# Patient Record
Sex: Male | Born: 1955
Health system: Southern US, Community
[De-identification: ages and names within clinical notes are randomized; demographics above are authoritative.]

## PROBLEM LIST (undated history)

## (undated) DIAGNOSIS — G4730 Sleep apnea, unspecified: Secondary | ICD-10-CM

## (undated) DIAGNOSIS — K219 Gastro-esophageal reflux disease without esophagitis: Secondary | ICD-10-CM

## (undated) DIAGNOSIS — H18609 Keratoconus, unspecified, unspecified eye: Secondary | ICD-10-CM

## (undated) DIAGNOSIS — J329 Chronic sinusitis, unspecified: Secondary | ICD-10-CM

## (undated) DIAGNOSIS — S060X9A Concussion with loss of consciousness of unspecified duration, initial encounter: Secondary | ICD-10-CM

## (undated) DIAGNOSIS — Z87442 Personal history of urinary calculi: Secondary | ICD-10-CM

## (undated) DIAGNOSIS — J189 Pneumonia, unspecified organism: Secondary | ICD-10-CM

## (undated) DIAGNOSIS — K5900 Constipation, unspecified: Secondary | ICD-10-CM

## (undated) DIAGNOSIS — M549 Dorsalgia, unspecified: Secondary | ICD-10-CM

## (undated) DIAGNOSIS — C8590 Non-Hodgkin lymphoma, unspecified, unspecified site: Secondary | ICD-10-CM

## (undated) DIAGNOSIS — F329 Major depressive disorder, single episode, unspecified: Secondary | ICD-10-CM

## (undated) DIAGNOSIS — C801 Malignant (primary) neoplasm, unspecified: Secondary | ICD-10-CM

## (undated) DIAGNOSIS — F32A Depression, unspecified: Secondary | ICD-10-CM

## (undated) DIAGNOSIS — F419 Anxiety disorder, unspecified: Secondary | ICD-10-CM

## (undated) DIAGNOSIS — D649 Anemia, unspecified: Secondary | ICD-10-CM

## (undated) DIAGNOSIS — Z9221 Personal history of antineoplastic chemotherapy: Secondary | ICD-10-CM

## (undated) DIAGNOSIS — S060XAA Concussion with loss of consciousness status unknown, initial encounter: Secondary | ICD-10-CM

## (undated) DIAGNOSIS — G8929 Other chronic pain: Secondary | ICD-10-CM

## (undated) HISTORY — PX: CATARACT EXTRACTION, BILATERAL: SHX1313

## (undated) HISTORY — PX: BACK SURGERY: SHX140

## (undated) HISTORY — PX: ANTERIOR FUSION CERVICAL SPINE: SUR626

## (undated) HISTORY — PX: EYE SURGERY: SHX253

---

## 2014-08-21 ENCOUNTER — Encounter (HOSPITAL_BASED_OUTPATIENT_CLINIC_OR_DEPARTMENT_OTHER): Payer: BLUE CROSS/BLUE SHIELD

## 2015-03-12 DIAGNOSIS — Z9221 Personal history of antineoplastic chemotherapy: Secondary | ICD-10-CM

## 2015-03-12 HISTORY — DX: Personal history of antineoplastic chemotherapy: Z92.21

## 2015-06-20 DIAGNOSIS — M5416 Radiculopathy, lumbar region: Secondary | ICD-10-CM | POA: Diagnosis not present

## 2015-06-20 DIAGNOSIS — M545 Low back pain: Secondary | ICD-10-CM | POA: Diagnosis not present

## 2015-06-24 DIAGNOSIS — M545 Low back pain: Secondary | ICD-10-CM | POA: Diagnosis not present

## 2015-06-28 DIAGNOSIS — K219 Gastro-esophageal reflux disease without esophagitis: Secondary | ICD-10-CM | POA: Diagnosis not present

## 2015-06-29 DIAGNOSIS — M4316 Spondylolisthesis, lumbar region: Secondary | ICD-10-CM | POA: Diagnosis not present

## 2015-06-29 DIAGNOSIS — M4806 Spinal stenosis, lumbar region: Secondary | ICD-10-CM | POA: Diagnosis not present

## 2015-06-29 DIAGNOSIS — M545 Low back pain: Secondary | ICD-10-CM | POA: Diagnosis not present

## 2015-07-17 DIAGNOSIS — K219 Gastro-esophageal reflux disease without esophagitis: Secondary | ICD-10-CM | POA: Diagnosis not present

## 2015-07-17 DIAGNOSIS — R12 Heartburn: Secondary | ICD-10-CM | POA: Diagnosis not present

## 2015-07-21 DIAGNOSIS — E785 Hyperlipidemia, unspecified: Secondary | ICD-10-CM | POA: Diagnosis not present

## 2015-07-21 DIAGNOSIS — R002 Palpitations: Secondary | ICD-10-CM | POA: Diagnosis not present

## 2015-07-21 DIAGNOSIS — K219 Gastro-esophageal reflux disease without esophagitis: Secondary | ICD-10-CM | POA: Diagnosis not present

## 2015-07-21 DIAGNOSIS — Z0000 Encounter for general adult medical examination without abnormal findings: Secondary | ICD-10-CM | POA: Diagnosis not present

## 2015-07-21 DIAGNOSIS — Z125 Encounter for screening for malignant neoplasm of prostate: Secondary | ICD-10-CM | POA: Diagnosis not present

## 2015-07-28 DIAGNOSIS — D509 Iron deficiency anemia, unspecified: Secondary | ICD-10-CM | POA: Diagnosis not present

## 2015-07-28 DIAGNOSIS — K5900 Constipation, unspecified: Secondary | ICD-10-CM | POA: Diagnosis not present

## 2015-08-08 DIAGNOSIS — M545 Low back pain: Secondary | ICD-10-CM | POA: Diagnosis not present

## 2015-08-08 DIAGNOSIS — M4806 Spinal stenosis, lumbar region: Secondary | ICD-10-CM | POA: Diagnosis not present

## 2015-08-08 DIAGNOSIS — M4316 Spondylolisthesis, lumbar region: Secondary | ICD-10-CM | POA: Diagnosis not present

## 2015-08-10 DIAGNOSIS — K635 Polyp of colon: Secondary | ICD-10-CM | POA: Diagnosis not present

## 2015-08-10 DIAGNOSIS — D127 Benign neoplasm of rectosigmoid junction: Secondary | ICD-10-CM | POA: Diagnosis not present

## 2015-08-10 DIAGNOSIS — D509 Iron deficiency anemia, unspecified: Secondary | ICD-10-CM | POA: Diagnosis not present

## 2015-08-29 DIAGNOSIS — D509 Iron deficiency anemia, unspecified: Secondary | ICD-10-CM | POA: Diagnosis not present

## 2015-09-18 DIAGNOSIS — D509 Iron deficiency anemia, unspecified: Secondary | ICD-10-CM | POA: Diagnosis not present

## 2015-09-19 DIAGNOSIS — D509 Iron deficiency anemia, unspecified: Secondary | ICD-10-CM | POA: Diagnosis not present

## 2015-09-25 DIAGNOSIS — D509 Iron deficiency anemia, unspecified: Secondary | ICD-10-CM | POA: Diagnosis not present

## 2015-09-28 ENCOUNTER — Other Ambulatory Visit: Payer: Self-pay | Admitting: Gastroenterology

## 2015-09-28 DIAGNOSIS — R198 Other specified symptoms and signs involving the digestive system and abdomen: Secondary | ICD-10-CM

## 2015-09-28 DIAGNOSIS — D509 Iron deficiency anemia, unspecified: Secondary | ICD-10-CM

## 2015-10-03 DIAGNOSIS — H52223 Regular astigmatism, bilateral: Secondary | ICD-10-CM | POA: Diagnosis not present

## 2015-10-04 ENCOUNTER — Ambulatory Visit
Admission: RE | Admit: 2015-10-04 | Discharge: 2015-10-04 | Disposition: A | Discharge status: Home / Self Care | Payer: BLUE CROSS/BLUE SHIELD | Source: Ambulatory Visit | Attending: Gastroenterology | Admitting: Gastroenterology

## 2015-10-04 DIAGNOSIS — K5900 Constipation, unspecified: Secondary | ICD-10-CM | POA: Diagnosis not present

## 2015-10-04 DIAGNOSIS — R198 Other specified symptoms and signs involving the digestive system and abdomen: Secondary | ICD-10-CM

## 2015-10-04 DIAGNOSIS — D509 Iron deficiency anemia, unspecified: Secondary | ICD-10-CM

## 2015-10-04 MED ORDER — IOPAMIDOL (ISOVUE-300) INJECTION 61%
100.0000 mL | Freq: Once | INTRAVENOUS | Status: AC | PRN
  Administered 2015-10-04: 125 mL via INTRAVENOUS

## 2015-10-05 ENCOUNTER — Other Ambulatory Visit: Payer: Self-pay | Admitting: Gastroenterology

## 2015-10-05 DIAGNOSIS — R935 Abnormal findings on diagnostic imaging of other abdominal regions, including retroperitoneum: Secondary | ICD-10-CM | POA: Diagnosis not present

## 2015-10-05 DIAGNOSIS — K6389 Other specified diseases of intestine: Secondary | ICD-10-CM | POA: Diagnosis not present

## 2015-10-05 DIAGNOSIS — D509 Iron deficiency anemia, unspecified: Secondary | ICD-10-CM | POA: Diagnosis not present

## 2015-10-06 ENCOUNTER — Ambulatory Visit
Admission: RE | Admit: 2015-10-06 | Discharge: 2015-10-06 | Disposition: A | Discharge status: Home / Self Care | Payer: BLUE CROSS/BLUE SHIELD | Source: Ambulatory Visit | Attending: Gastroenterology | Admitting: Gastroenterology

## 2015-10-06 DIAGNOSIS — K6389 Other specified diseases of intestine: Secondary | ICD-10-CM

## 2015-10-06 DIAGNOSIS — K7689 Other specified diseases of liver: Secondary | ICD-10-CM | POA: Diagnosis not present

## 2015-10-06 DIAGNOSIS — R935 Abnormal findings on diagnostic imaging of other abdominal regions, including retroperitoneum: Secondary | ICD-10-CM

## 2015-10-06 MED ORDER — GADOBENATE DIMEGLUMINE 529 MG/ML IV SOLN
16.0000 mL | Freq: Once | INTRAVENOUS | Status: AC | PRN
  Administered 2015-10-06: 16 mL via INTRAVENOUS

## 2015-10-09 ENCOUNTER — Ambulatory Visit: Payer: Self-pay | Admitting: General Surgery

## 2015-10-09 DIAGNOSIS — K6389 Other specified diseases of intestine: Secondary | ICD-10-CM | POA: Diagnosis not present

## 2015-10-09 NOTE — H&P (Signed)
History of Present Illness Ralene Ok MD; 10/09/2015 4:03 PM) Patient words: sm bowel mass.  The patient is a 61 year old male who presents with an abdominal mass. Patient is a 60 year old male who is referred by Dr. Anson Fret for evaluation of a small bowel mass. Patient has had some iron deficiency anemia. Patient underwent upper and lower endoscopy. This was negative aside from a colonic polyp. This was hyperplastic. Patient therefore underwent capsule endoscopy. This revealed a small bowel mass near the distal portion of the small bowel. The patient underwent CT enterography which revealed a thickening approximate 5.5 cm in the distal bowel. This also showed a liver mass. This was followed up with an MRI. This was revealed to be focal nodular hyperplasia, 3.9 cm, segment 5.   Patient states he's lost approximately 10 pounds the last 2 weeks.    Other Problems (Ammie Eversole, LPN; X33443 D34-534 PM) Back Pain Gastroesophageal Reflux Disease Kidney Stone Melanoma  Past Surgical History (Ammie Eversole, LPN; X33443 D34-534 PM) Cataract Surgery Bilateral. Colon Polyp Removal - Colonoscopy Spinal Surgery - Lower Back Spinal Surgery - Neck  Diagnostic Studies History (Ammie Eversole, LPN; X33443 D34-534 PM) Colonoscopy within last year  Allergies (Ammie Eversole, LPN; X33443 [id number] PM) Penicillins  Medication History (Ammie Eversole, LPN; X33443 [id number] PM) ClonazePAM (1MG  Tablet, Oral) Active. Dexilant (60MG  Capsule DR, Oral) Active. FLUoxetine HCl (20MG  Tablet, Oral) Active. Gabapentin (300MG  Capsule, Oral) Active. Rosuvastatin Calcium (5MG  Tablet, Oral) Active. Qvar (40MCG/ACT Aerosol Soln, Inhalation) Active. ProAir HFA (108 (90 Base)MCG/ACT Aerosol Soln, Inhalation) Active. Medications Reconciled  Social History (Ammie Eversole, LPN; X33443 D34-534 PM) Caffeine use Carbonated beverages. No alcohol use No drug use Tobacco use Never  smoker.  Family History (Aleatha Borer, LPN; X33443 D34-534 PM) Alcohol Abuse Brother, Father. Arthritis Mother. Colon Polyps Brother. Heart Disease Father, Mother. Prostate Cancer Father.    Review of Systems (Ammie Eversole LPN; X33443 D34-534 PM) General Present- Fatigue and Weight Loss. Not Present- Appetite Loss, Chills, Fever, Night Sweats and Weight Gain. Skin Not Present- Change in Wart/Mole, Dryness, Hives, Jaundice, New Lesions, Non-Healing Wounds, Rash and Ulcer. HEENT Present- Wears glasses/contact lenses. Not Present- Earache, Hearing Loss, Hoarseness, Nose Bleed, Oral Ulcers, Ringing in the Ears, Seasonal Allergies, Sinus Pain, Sore Throat, Visual Disturbances and Yellow Eyes. Respiratory Present- Snoring. Not Present- Bloody sputum, Chronic Cough, Difficulty Breathing and Wheezing. Breast Not Present- Breast Mass, Breast Pain, Nipple Discharge and Skin Changes. Cardiovascular Present- Palpitations and Shortness of Breath. Not Present- Chest Pain, Difficulty Breathing Lying Down, Leg Cramps, Rapid Heart Rate and Swelling of Extremities. Gastrointestinal Present- Bloating and Constipation. Not Present- Abdominal Pain, Bloody Stool, Change in Bowel Habits, Chronic diarrhea, Difficulty Swallowing, Excessive gas, Gets full quickly at meals, Hemorrhoids, Indigestion, Nausea, Rectal Pain and Vomiting. Male Genitourinary Not Present- Blood in Urine, Change in Urinary Stream, Frequency, Impotence, Nocturia, Painful Urination, Urgency and Urine Leakage. Musculoskeletal Present- Back Pain. Not Present- Joint Pain, Joint Stiffness, Muscle Pain, Muscle Weakness and Swelling of Extremities. Neurological Present- Headaches. Not Present- Decreased Memory, Fainting, Numbness, Seizures, Tingling, Tremor, Trouble walking and Weakness. Psychiatric Not Present- Anxiety, Bipolar, Change in Sleep Pattern, Depression, Fearful and Frequent crying. Endocrine Not Present- Cold Intolerance, Excessive  Hunger, Hair Changes, Heat Intolerance, Hot flashes and New Diabetes. Hematology Not Present- Blood Thinners, Easy Bruising, Excessive bleeding, Gland problems, HIV and Persistent Infections.  Vitals (Ammie Eversole LPN; X33443 D34-534 PM) 10/09/2015 3:21 PM Weight: 195.4 lb Height: 70in Body Surface Area: 2.07 m Body Mass Index: 28.04  kg/m  Temp.: 97.50F(Oral)  Pulse: 74 (Regular)  BP: 118/72 (Sitting, Left Arm, Standard)       Physical Exam Ralene Ok MD; 10/09/2015 4:03 PM) General Mental Status-Alert. General Appearance-Consistent with stated age. Hydration-Well hydrated. Voice-Normal.  Chest and Lung Exam Chest and lung exam reveals -quiet, even and easy respiratory effort with no use of accessory muscles and on auscultation, normal breath sounds, no adventitious sounds and normal vocal resonance. Inspection Chest Wall - Normal. Back - normal.  Cardiovascular Cardiovascular examination reveals -normal heart sounds, regular rate and rhythm with no murmurs and normal pedal pulses bilaterally.  Abdomen Inspection Inspection of the abdomen reveals - No Hernias. Skin - Scar - no surgical scars. Palpation/Percussion Palpation and Percussion of the abdomen reveal - Soft, Non Tender, No Rebound tenderness, No Rigidity (guarding) and No hepatosplenomegaly. Auscultation Auscultation of the abdomen reveals - Bowel sounds normal.  Neurologic Neurologic evaluation reveals -alert and oriented x 3 with no impairment of recent or remote memory. Mental Status-Normal.  Musculoskeletal Normal Exam - Left-Upper Extremity Strength Normal and Lower Extremity Strength Normal. Normal Exam - Right-Upper Extremity Strength Normal and Lower Extremity Strength Normal.    Assessment & Plan Ralene Ok MD; 10/09/2015 4:04 PM) SMALL BOWEL MASS (K63.89) Impression: Patient is a 60 year old male with a distal small bowel mass.  1. At this time we'll  proceed to the operating for laparoscopic assisted small bowel resection. Secondary to the fact this may be needed terminal ileum patient may require right hemicolectomy. 2. I discussed with the patient the risks and benefits of the procedure to include but not limited to: Infection, bleeding, damage to structures, possible need further surgery, possible infection postoperatively. Patient was understanding and wished to proceed.

## 2015-10-13 ENCOUNTER — Encounter (HOSPITAL_COMMUNITY)
Admission: RE | Admit: 2015-10-13 | Discharge: 2015-10-13 | Disposition: A | Discharge status: Home / Self Care | Payer: BLUE CROSS/BLUE SHIELD | Source: Ambulatory Visit | Attending: General Surgery | Admitting: General Surgery

## 2015-10-13 ENCOUNTER — Encounter (HOSPITAL_COMMUNITY): Payer: Self-pay

## 2015-10-13 DIAGNOSIS — Z01812 Encounter for preprocedural laboratory examination: Secondary | ICD-10-CM | POA: Diagnosis not present

## 2015-10-13 HISTORY — DX: Gastro-esophageal reflux disease without esophagitis: K21.9

## 2015-10-13 HISTORY — DX: Anemia, unspecified: D64.9

## 2015-10-13 HISTORY — DX: Personal history of urinary calculi: Z87.442

## 2015-10-13 HISTORY — DX: Other chronic pain: G89.29

## 2015-10-13 HISTORY — DX: Dorsalgia, unspecified: M54.9

## 2015-10-13 HISTORY — DX: Depression, unspecified: F32.A

## 2015-10-13 HISTORY — DX: Chronic sinusitis, unspecified: J32.9

## 2015-10-13 HISTORY — DX: Major depressive disorder, single episode, unspecified: F32.9

## 2015-10-13 LAB — CBC
HEMATOCRIT: 35.8 % — AB (ref 39.0–52.0)
HEMOGLOBIN: 12 g/dL — AB (ref 13.0–17.0)
MCH: 27.3 pg (ref 26.0–34.0)
MCHC: 33.5 g/dL (ref 30.0–36.0)
MCV: 81.5 fL (ref 78.0–100.0)
Platelets: 251 10*3/uL (ref 150–400)
RBC: 4.39 MIL/uL (ref 4.22–5.81)
RDW: 20.2 % — ABNORMAL HIGH (ref 11.5–15.5)
WBC: 5.4 10*3/uL (ref 4.0–10.5)

## 2015-10-13 LAB — ABO/RH: ABO/RH(D): A POS

## 2015-10-13 NOTE — Patient Instructions (Addendum)
Edward Steele  10/13/2015   Your procedure is scheduled on: 10-17-15  Report to [facility] Main  Entrance take [place]  elevators to 3rd floor to  [place] at   [place].  Call this number if you have problems the morning of surgery [phone] Follow any bowel prep instructions per Doctor.  Remember: ONLY 1 PERSON MAY GO WITH YOU TO SHORT STAY TO GET  READY MORNING OF YOUR SURGERY.  Do not eat food or drink liquids :After Midnight. Exception Clear Liquids 12 midnight to 0800 AM, then nothing.   CLEAR LIQUID DIET   Foods Allowed                                                                     Foods Excluded  Coffee and tea, regular and decaf                             liquids that you cannot  Plain Jell-O in any flavor                                             see through such as: Fruit ices (not with fruit pulp)                                     milk, soups, orange juice  Iced Popsicles                                    All solid food Carbonated beverages, regular and diet                                    Cranberry, grape and apple juices Sports drinks like Gatorade Lightly seasoned clear broth or consume(fat free) Sugar, honey syrup   _____________________________________________________________________       Take these medicines the morning of surgery with A SIP OF WATER: Clonazepam. Dexilant. Fluoxetine. Use Inhalers-if need. DO NOT TAKE ANY DIABETIC MEDICATIONS DAY OF YOUR SURGERY                               You may not have any metal on your body including hair pins and              piercings  Do not wear jewelry, make-up, lotions, powders or perfumes, deodorant             Do not wear nail polish.  Do not shave  48 hours prior to surgery.              Men may shave face and neck.   Do not bring valuables to the hospital. [facility] IS NOT  RESPONSIBLE   FOR VALUABLES.  Contacts, dentures or bridgework may not be  worn into surgery.  Leave suitcase in the car. After surgery it may be brought to your room.     Patients discharged the day of surgery will not be allowed to drive home.  Name and phone number of your driver:spouse   Special Instructions: N/A              Please read over the following fact sheets you were given: _____________________________________________________________________             [facility] - Preparing for Surgery Before surgery, you can play an important role.  Because skin is not sterile, your skin needs to be as free of germs as possible.  You can reduce the number of germs on your skin by washing with CHG (chlorahexidine gluconate) soap before surgery.  CHG is an antiseptic cleaner which kills germs and bonds with the skin to continue killing germs even after washing. Please DO NOT use if you have an allergy to CHG or antibacterial soaps.  If your skin becomes reddened/irritated stop using the CHG and inform your nurse when you arrive at Short Stay. Do not shave (including legs and underarms) for at least 48 hours prior to the first CHG shower.  You may shave your face/neck. Please follow these instructions carefully:  1.  Shower with CHG Soap the night before surgery and the  morning of Surgery.  2.  If you choose to wash your hair, wash your hair first as usual with your  normal  shampoo.  3.  After you shampoo, rinse your hair and body thoroughly to remove the  shampoo.                           4.  Use CHG as you would any other liquid soap.  You can apply chg directly  to the skin and wash                       Gently with a scrungie or clean washcloth.  5.  Apply the CHG Soap to your body ONLY FROM THE NECK DOWN.   Do not use on face/ open                           Wound or open sores. Avoid contact with eyes, ears mouth and genitals (private parts).                       Wash face,  Genitals (private parts) with your normal soap.             6.  Wash thoroughly,  paying special attention to the area where your surgery  will be performed.  7.  Thoroughly rinse your body with warm water from the neck down.  8.  DO NOT shower/wash with your normal soap after using and rinsing off  the CHG Soap.                9.  Pat yourself dry with a clean towel.            10.  Wear clean pajamas.            11.  Place clean sheets on your bed the night of your first shower and do not  sleep with pets. Day of Surgery :  Do not apply any lotions/deodorants the morning of surgery.  Please wear clean clothes to the hospital/surgery center.  FAILURE TO FOLLOW THESE INSTRUCTIONS MAY RESULT IN THE CANCELLATION OF YOUR SURGERY PATIENT SIGNATURE_________________________________  NURSE SIGNATURE__________________________________  ________________________________________________________________________

## 2015-10-13 NOTE — Pre-Procedure Instructions (Signed)
EKG 3 weeks ago recently requested and awaiting fax

## 2015-10-13 NOTE — Pre-Procedure Instructions (Signed)
EKG RECEIVED  07-21-15 AND NOW WITH CHART.

## 2015-10-17 ENCOUNTER — Inpatient Hospital Stay (HOSPITAL_COMMUNITY): Payer: BLUE CROSS/BLUE SHIELD | Admitting: Anesthesiology

## 2015-10-17 ENCOUNTER — Encounter (HOSPITAL_COMMUNITY): Payer: Self-pay

## 2015-10-17 ENCOUNTER — Encounter (HOSPITAL_COMMUNITY)
Admission: RE | Disposition: A | Discharge status: Home / Self Care | Payer: Self-pay | Source: Ambulatory Visit | Attending: General Surgery

## 2015-10-17 ENCOUNTER — Inpatient Hospital Stay (HOSPITAL_COMMUNITY)
Admission: RE | Admit: 2015-10-17 | Discharge: 2015-10-22 | DRG: 824 | Disposition: A | Discharge status: Home / Self Care | Payer: BLUE CROSS/BLUE SHIELD | Source: Ambulatory Visit | Attending: General Surgery | Admitting: General Surgery

## 2015-10-17 DIAGNOSIS — Z880 Allergy status to penicillin: Secondary | ICD-10-CM

## 2015-10-17 DIAGNOSIS — C8333 Diffuse large B-cell lymphoma, intra-abdominal lymph nodes: Principal | ICD-10-CM | POA: Diagnosis present

## 2015-10-17 DIAGNOSIS — R1900 Intra-abdominal and pelvic swelling, mass and lump, unspecified site: Secondary | ICD-10-CM | POA: Diagnosis not present

## 2015-10-17 DIAGNOSIS — M549 Dorsalgia, unspecified: Secondary | ICD-10-CM | POA: Diagnosis not present

## 2015-10-17 DIAGNOSIS — D509 Iron deficiency anemia, unspecified: Secondary | ICD-10-CM | POA: Diagnosis present

## 2015-10-17 DIAGNOSIS — K219 Gastro-esophageal reflux disease without esophagitis: Secondary | ICD-10-CM | POA: Diagnosis not present

## 2015-10-17 DIAGNOSIS — Z5331 Laparoscopic surgical procedure converted to open procedure: Secondary | ICD-10-CM | POA: Diagnosis not present

## 2015-10-17 DIAGNOSIS — K6389 Other specified diseases of intestine: Secondary | ICD-10-CM | POA: Diagnosis not present

## 2015-10-17 DIAGNOSIS — Z79899 Other long term (current) drug therapy: Secondary | ICD-10-CM

## 2015-10-17 DIAGNOSIS — C8339 Diffuse large B-cell lymphoma, extranodal and solid organ sites: Secondary | ICD-10-CM | POA: Diagnosis not present

## 2015-10-17 DIAGNOSIS — Z9049 Acquired absence of other specified parts of digestive tract: Secondary | ICD-10-CM

## 2015-10-17 DIAGNOSIS — K567 Ileus, unspecified: Secondary | ICD-10-CM | POA: Diagnosis not present

## 2015-10-17 DIAGNOSIS — C8338 Diffuse large B-cell lymphoma, lymph nodes of multiple sites: Secondary | ICD-10-CM | POA: Diagnosis not present

## 2015-10-17 HISTORY — PX: LAPAROSCOPY: SHX197

## 2015-10-17 HISTORY — PX: BOWEL RESECTION: SHX1257

## 2015-10-17 LAB — TYPE AND SCREEN
ABO/RH(D): A POS
ANTIBODY SCREEN: NEGATIVE

## 2015-10-17 SURGERY — LAPAROSCOPY, DIAGNOSTIC
Anesthesia: General | Site: Abdomen

## 2015-10-17 MED ORDER — ROCURONIUM BROMIDE 100 MG/10ML IV SOLN
INTRAVENOUS | Status: DC | PRN
  Administered 2015-10-17: 50 mg via INTRAVENOUS
  Administered 2015-10-17: 5 mg via INTRAVENOUS
  Administered 2015-10-17: 20 mg via INTRAVENOUS

## 2015-10-17 MED ORDER — PROMETHAZINE HCL 25 MG/ML IJ SOLN: 6.2500 mg | INTRAMUSCULAR | Status: DC | PRN

## 2015-10-17 MED ORDER — CIPROFLOXACIN IN D5W 400 MG/200ML IV SOLN
400.0000 mg | Freq: Once | INTRAVENOUS | Status: AC
  Administered 2015-10-17: 400 mg via INTRAVENOUS

## 2015-10-17 MED ORDER — BUPIVACAINE-EPINEPHRINE 0.25% -1:200000 IJ SOLN
INTRAMUSCULAR | Status: AC
  Filled 2015-10-17: qty 1

## 2015-10-17 MED ORDER — LIDOCAINE HCL (CARDIAC) 20 MG/ML IV SOLN
INTRAVENOUS | Status: DC | PRN
  Administered 2015-10-17: 50 mg via INTRAVENOUS

## 2015-10-17 MED ORDER — GLYCOPYRROLATE 0.2 MG/ML IJ SOLN
INTRAMUSCULAR | Status: DC | PRN
  Administered 2015-10-17: 0.2 mg via INTRAVENOUS

## 2015-10-17 MED ORDER — EPHEDRINE SULFATE 50 MG/ML IJ SOLN
INTRAMUSCULAR | Status: DC | PRN
  Administered 2015-10-17 (×2): 15 mg via INTRAVENOUS
  Administered 2015-10-17: 5 mg via INTRAVENOUS

## 2015-10-17 MED ORDER — LACTATED RINGERS IR SOLN
Status: DC | PRN
  Administered 2015-10-17: 1000 mL

## 2015-10-17 MED ORDER — MIDAZOLAM HCL 2 MG/2ML IJ SOLN
INTRAMUSCULAR | Status: AC
  Filled 2015-10-17: qty 2

## 2015-10-17 MED ORDER — BUDESONIDE 0.25 MG/2ML IN SUSP
0.2500 mg | Freq: Two times a day (BID) | RESPIRATORY_TRACT | Status: DC
  Administered 2015-10-17 – 2015-10-21 (×8): 0.25 mg via RESPIRATORY_TRACT
  Filled 2015-10-17 (×10): qty 2

## 2015-10-17 MED ORDER — CHLORHEXIDINE GLUCONATE CLOTH 2 % EX PADS
6.0000 | MEDICATED_PAD | Freq: Once | CUTANEOUS | Status: DC
Start: 2015-10-17 — End: 2015-10-17

## 2015-10-17 MED ORDER — DIPHENHYDRAMINE HCL 12.5 MG/5ML PO ELIX: 12.5000 mg | ORAL_SOLUTION | Freq: Four times a day (QID) | ORAL | Status: DC | PRN

## 2015-10-17 MED ORDER — LORAZEPAM 2 MG/ML IJ SOLN
0.5000 mg | Freq: Four times a day (QID) | INTRAMUSCULAR | Status: DC | PRN
  Administered 2015-10-18 – 2015-10-19 (×3): 0.5 mg via INTRAVENOUS
  Filled 2015-10-17 (×3): qty 1

## 2015-10-17 MED ORDER — CEFOTETAN DISODIUM-DEXTROSE 2-2.08 GM-% IV SOLR: 2.0000 g | INTRAVENOUS | Status: DC

## 2015-10-17 MED ORDER — SODIUM CHLORIDE 0.9 % IR SOLN
Status: DC | PRN
  Administered 2015-10-17: 1000 mL via INTRAVESICAL

## 2015-10-17 MED ORDER — HYDROMORPHONE HCL 1 MG/ML IJ SOLN
INTRAMUSCULAR | Status: AC
  Filled 2015-10-17: qty 1

## 2015-10-17 MED ORDER — DIPHENHYDRAMINE HCL 50 MG/ML IJ SOLN: 12.5000 mg | Freq: Four times a day (QID) | INTRAMUSCULAR | Status: DC | PRN

## 2015-10-17 MED ORDER — MIDAZOLAM HCL 5 MG/5ML IJ SOLN
INTRAMUSCULAR | Status: DC | PRN
  Administered 2015-10-17: 2 mg via INTRAVENOUS

## 2015-10-17 MED ORDER — FENTANYL CITRATE (PF) 250 MCG/5ML IJ SOLN
INTRAMUSCULAR | Status: AC
  Filled 2015-10-17: qty 5

## 2015-10-17 MED ORDER — SUGAMMADEX SODIUM 200 MG/2ML IV SOLN
INTRAVENOUS | Status: DC | PRN
  Administered 2015-10-17: 180 mg via INTRAVENOUS

## 2015-10-17 MED ORDER — METRONIDAZOLE IN NACL 5-0.79 MG/ML-% IV SOLN
500.0000 mg | Freq: Three times a day (TID) | INTRAVENOUS | Status: AC
  Administered 2015-10-17 – 2015-10-18 (×3): 500 mg via INTRAVENOUS
  Filled 2015-10-17 (×4): qty 100

## 2015-10-17 MED ORDER — ONDANSETRON HCL 4 MG/2ML IJ SOLN
INTRAMUSCULAR | Status: DC | PRN
  Administered 2015-10-17: 4 mg via INTRAVENOUS

## 2015-10-17 MED ORDER — 0.9 % SODIUM CHLORIDE (POUR BTL) OPTIME
TOPICAL | Status: DC | PRN
  Administered 2015-10-17: 2000 mL

## 2015-10-17 MED ORDER — ONDANSETRON 4 MG PO TBDP: 4.0000 mg | ORAL_TABLET | Freq: Four times a day (QID) | ORAL | Status: DC | PRN

## 2015-10-17 MED ORDER — LACTATED RINGERS IV SOLN
INTRAVENOUS | Status: DC
  Administered 2015-10-17 (×2): via INTRAVENOUS

## 2015-10-17 MED ORDER — ALBUTEROL SULFATE (2.5 MG/3ML) 0.083% IN NEBU
3.0000 mL | INHALATION_SOLUTION | Freq: Four times a day (QID) | RESPIRATORY_TRACT | Status: DC | PRN
  Filled 2015-10-17: qty 3

## 2015-10-17 MED ORDER — CIPROFLOXACIN IN D5W 400 MG/200ML IV SOLN
INTRAVENOUS | Status: AC
  Filled 2015-10-17: qty 200

## 2015-10-17 MED ORDER — BUPIVACAINE-EPINEPHRINE 0.25% -1:200000 IJ SOLN
INTRAMUSCULAR | Status: DC | PRN
  Administered 2015-10-17: 5 mL

## 2015-10-17 MED ORDER — ONDANSETRON HCL 4 MG/2ML IJ SOLN: 4.0000 mg | Freq: Four times a day (QID) | INTRAMUSCULAR | Status: DC | PRN

## 2015-10-17 MED ORDER — HYDROMORPHONE HCL 1 MG/ML IJ SOLN
0.2500 mg | INTRAMUSCULAR | Status: DC | PRN
  Administered 2015-10-17 (×4): 0.5 mg via INTRAVENOUS

## 2015-10-17 MED ORDER — DEXTROSE-NACL 5-0.9 % IV SOLN
INTRAVENOUS | Status: DC
  Administered 2015-10-17 – 2015-10-18 (×2): 100 mL/h via INTRAVENOUS
  Administered 2015-10-18 – 2015-10-21 (×6): via INTRAVENOUS

## 2015-10-17 MED ORDER — PROPOFOL 10 MG/ML IV BOLUS
INTRAVENOUS | Status: DC | PRN
  Administered 2015-10-17: 180 mg via INTRAVENOUS

## 2015-10-17 MED ORDER — ONDANSETRON HCL 4 MG/2ML IJ SOLN
INTRAMUSCULAR | Status: AC
  Filled 2015-10-17: qty 2

## 2015-10-17 MED ORDER — HYDROMORPHONE HCL 1 MG/ML IJ SOLN
1.0000 mg | INTRAMUSCULAR | Status: DC | PRN
  Administered 2015-10-17 (×2): 1 mg via INTRAVENOUS
  Administered 2015-10-18: 2 mg via INTRAVENOUS
  Administered 2015-10-18 (×3): 1 mg via INTRAVENOUS
  Administered 2015-10-18: 2 mg via INTRAVENOUS
  Administered 2015-10-18 (×2): 1 mg via INTRAVENOUS
  Administered 2015-10-19: 2 mg via INTRAVENOUS
  Administered 2015-10-19: 1 mg via INTRAVENOUS
  Filled 2015-10-17 (×5): qty 1
  Filled 2015-10-17: qty 2
  Filled 2015-10-17 (×4): qty 1
  Filled 2015-10-17: qty 2
  Filled 2015-10-17 (×2): qty 1

## 2015-10-17 MED ORDER — ENOXAPARIN SODIUM 40 MG/0.4ML ~~LOC~~ SOLN
40.0000 mg | SUBCUTANEOUS | Status: DC
  Administered 2015-10-17 – 2015-10-21 (×5): 40 mg via SUBCUTANEOUS
  Filled 2015-10-17 (×5): qty 0.4

## 2015-10-17 MED ORDER — CIPROFLOXACIN IN D5W 400 MG/200ML IV SOLN
400.0000 mg | Freq: Two times a day (BID) | INTRAVENOUS | Status: AC
  Administered 2015-10-18 (×2): 400 mg via INTRAVENOUS
  Filled 2015-10-17 (×2): qty 200

## 2015-10-17 MED ORDER — FAMOTIDINE IN NACL 20-0.9 MG/50ML-% IV SOLN
20.0000 mg | Freq: Two times a day (BID) | INTRAVENOUS | Status: DC
  Administered 2015-10-17 – 2015-10-21 (×9): 20 mg via INTRAVENOUS
  Filled 2015-10-17 (×12): qty 50

## 2015-10-17 MED ORDER — SUGAMMADEX SODIUM 200 MG/2ML IV SOLN
INTRAVENOUS | Status: AC
  Filled 2015-10-17: qty 2

## 2015-10-17 MED ORDER — FENTANYL CITRATE (PF) 100 MCG/2ML IJ SOLN
INTRAMUSCULAR | Status: DC | PRN
  Administered 2015-10-17: 100 ug via INTRAVENOUS
  Administered 2015-10-17 (×3): 50 ug via INTRAVENOUS

## 2015-10-17 SURGICAL SUPPLY — 47 items
BENZOIN TINCTURE PRP APPL 2/3 (GAUZE/BANDAGES/DRESSINGS) IMPLANT
CLOSURE WOUND 1/2 X4 (GAUZE/BANDAGES/DRESSINGS)
COVER SURGICAL LIGHT HANDLE (MISCELLANEOUS) ×4 IMPLANT
DECANTER SPIKE VIAL GLASS SM (MISCELLANEOUS) ×4 IMPLANT
DRAPE LAPAROSCOPIC ABDOMINAL (DRAPES) ×4 IMPLANT
DRSG OPSITE POSTOP 4X6 (GAUZE/BANDAGES/DRESSINGS) ×4 IMPLANT
ELECT BLADE TIP CTD 4 INCH (ELECTRODE) ×4 IMPLANT
ELECT PENCIL ROCKER SW 15FT (MISCELLANEOUS) ×4 IMPLANT
ELECT REM PT RETURN 9FT ADLT (ELECTROSURGICAL) ×4
ELECTRODE REM PT RTRN 9FT ADLT (ELECTROSURGICAL) ×2 IMPLANT
GAUZE SPONGE 2X2 8PLY STRL LF (GAUZE/BANDAGES/DRESSINGS) ×2 IMPLANT
GLOVE BIO SURGEON STRL SZ7.5 (GLOVE) ×4 IMPLANT
GOWN STRL REUS W/TWL XL LVL3 (GOWN DISPOSABLE) ×8 IMPLANT
HANDLE SUCTION POOLE (INSTRUMENTS) ×2 IMPLANT
IRRIG SUCT STRYKERFLOW 2 WTIP (MISCELLANEOUS) ×4
IRRIGATION SUCT STRKRFLW 2 WTP (MISCELLANEOUS) ×2 IMPLANT
KIT BASIN OR (CUSTOM PROCEDURE TRAY) ×4 IMPLANT
LIGASURE IMPACT 36 18CM CVD LR (INSTRUMENTS) ×4 IMPLANT
LIQUID BAND (GAUZE/BANDAGES/DRESSINGS) IMPLANT
MARKER SKIN DUAL TIP RULER LAB (MISCELLANEOUS) ×4 IMPLANT
NEEDLE INSUFFLATION 14GA 120MM (NEEDLE) ×4 IMPLANT
RELOAD PROXIMATE 75MM BLUE (ENDOMECHANICALS) ×12 IMPLANT
SHEARS HARMONIC ACE PLUS 36CM (ENDOMECHANICALS) IMPLANT
SLEEVE XCEL OPT CAN 5 100 (ENDOMECHANICALS) ×8 IMPLANT
SOLUTION ANTI FOG 6CC (MISCELLANEOUS) IMPLANT
SPONGE GAUZE 2X2 STER 10/PKG (GAUZE/BANDAGES/DRESSINGS) ×2
SPONGE LAP 18X18 X RAY DECT (DISPOSABLE) ×4 IMPLANT
STAPLER PROXIMATE 75MM BLUE (STAPLE) ×4 IMPLANT
STAPLER VISISTAT 35W (STAPLE) ×4 IMPLANT
STRIP CLOSURE SKIN 1/2X4 (GAUZE/BANDAGES/DRESSINGS) IMPLANT
SUCTION POOLE HANDLE (INSTRUMENTS) ×4
SUT PDS AB 1 CTX 36 (SUTURE) ×8 IMPLANT
SUT PDS AB 1 CTXB1 36 (SUTURE) ×8 IMPLANT
SUT SILK 3 0 SH CR/8 (SUTURE) ×4 IMPLANT
SUT VIC AB 3-0 SH 8-18 (SUTURE) ×4 IMPLANT
SUT VIC AB 4-0 PS2 27 (SUTURE) IMPLANT
SYR BULB IRRIGATION 50ML (SYRINGE) ×4 IMPLANT
TAPE CLOTH SURG 4X10 WHT LF (GAUZE/BANDAGES/DRESSINGS) ×4 IMPLANT
TOWEL OR 17X26 10 PK STRL BLUE (TOWEL DISPOSABLE) ×4 IMPLANT
TOWEL OR NON WOVEN STRL DISP B (DISPOSABLE) ×4 IMPLANT
TRAY FOLEY W/METER SILVER 16FR (SET/KITS/TRAYS/PACK) ×4 IMPLANT
TRAY LAPAROSCOPIC (CUSTOM PROCEDURE TRAY) ×4 IMPLANT
TROCAR BLADELESS OPT 5 100 (ENDOMECHANICALS) ×4 IMPLANT
TROCAR XCEL BLUNT TIP 100MML (ENDOMECHANICALS) IMPLANT
TROCAR XCEL NON-BLD 11X100MML (ENDOMECHANICALS) IMPLANT
TUBING INSUF HEATED (TUBING) ×4 IMPLANT
YANKAUER SUCT BULB TIP 10FT TU (MISCELLANEOUS) ×4 IMPLANT

## 2015-10-17 NOTE — Transfer of Care (Signed)
Immediate Anesthesia Transfer of Care Note  Patient: Edward Steele  Procedure(s) Performed: Procedure(s): LAPAROSCOPY DIAGNOSTIC (N/A) EXPLORATORY LAPAROTOMY, SMALL BOWEL RESECTION, WITH ANASTAMOSIS (N/A)  Patient Location: PACU  Anesthesia Type:General  Level of Consciousness: awake, alert , oriented and patient cooperative  Airway & Oxygen Therapy: Patient Spontanous Breathing and Patient connected to face mask oxygen  Post-op Assessment: Report given to RN, Post -op Vital signs reviewed and stable and Patient moving all extremities  Post vital signs: Reviewed and stable  Last Vitals:  Vitals:   10/17/15 1214  BP: 124/80  Pulse: (!) 58  Resp: 16  Temp: 36.7 C    Last Pain:  Vitals:   10/17/15 1214  TempSrc: Oral         Complications: No apparent anesthesia complications

## 2015-10-17 NOTE — Anesthesia Preprocedure Evaluation (Addendum)
Anesthesia Evaluation  Patient identified by MRN, date of birth, ID band Patient awake    Reviewed: Allergy & Precautions, NPO status , Patient's Chart, lab work & pertinent test results  Airway Mallampati: II  TM Distance: >3 FB Neck ROM: Full    Dental  (+) Dental Advisory Given   Pulmonary neg pulmonary ROS,    breath sounds clear to auscultation       Cardiovascular negative cardio ROS   Rhythm:Regular Rate:Normal     Neuro/Psych Depression negative neurological ROS     GI/Hepatic Neg liver ROS, GERD  ,Small bowel mass   Endo/Other  negative endocrine ROS  Renal/GU negative Renal ROS     Musculoskeletal   Abdominal   Peds  Hematology  (+) anemia ,   Anesthesia Other Findings   Reproductive/Obstetrics                            Lab Results  Component Value Date   WBC 5.4 10/13/2015   HGB 12.0 (L) 10/13/2015   HCT 35.8 (L) 10/13/2015   MCV 81.5 10/13/2015   PLT 251 10/13/2015   No results found for: CREATININE, BUN, NA, K, CL, CO2  Anesthesia Physical Anesthesia Plan  ASA: II  Anesthesia Plan: General   Post-op Pain Management:    Induction: Intravenous  Airway Management Planned: Oral ETT  Additional Equipment:   Intra-op Plan:   Post-operative Plan: Extubation in OR  Informed Consent: I have reviewed the patients History and Physical, chart, labs and discussed the procedure including the risks, benefits and alternatives for the proposed anesthesia with the patient or authorized representative who has indicated his/her understanding and acceptance.   Dental advisory given  Plan Discussed with: CRNA  Anesthesia Plan Comments:         Anesthesia Quick Evaluation

## 2015-10-17 NOTE — Anesthesia Procedure Notes (Signed)
Procedure Name: Intubation Performed by: Kalanie Fewell J Pre-anesthesia Checklist: Patient identified, Emergency Drugs available, Suction available, Patient being monitored and Timeout performed Patient Re-evaluated:Patient Re-evaluated prior to inductionOxygen Delivery Method: Circle system utilized Preoxygenation: Pre-oxygenation with 100% oxygen Intubation Type: IV induction Ventilation: Mask ventilation without difficulty Laryngoscope Size: Mac and 3 Grade View: Grade I Tube type: Oral Tube size: 7.5 mm Number of attempts: 1 Airway Equipment and Method: Stylet Placement Confirmation: ETT inserted through vocal cords under direct vision,  positive ETCO2,  CO2 detector and breath sounds checked- equal and bilateral Secured at: 23 cm Tube secured with: Tape Dental Injury: Teeth and Oropharynx as per pre-operative assessment        

## 2015-10-17 NOTE — H&P (View-Only) (Signed)
History of Present Illness Ralene Ok MD; 10/09/2015 4:03 PM) Patient words: sm bowel mass.  The patient is a 60 year old male who presents with an abdominal mass. Patient is a 60 year old male who is referred by Dr. Anson Fret for evaluation of a small bowel mass. Patient has had some iron deficiency anemia. Patient underwent upper and lower endoscopy. This was negative aside from a colonic polyp. This was hyperplastic. Patient therefore underwent capsule endoscopy. This revealed a small bowel mass near the distal portion of the small bowel. The patient underwent CT enterography which revealed a thickening approximate 5.5 cm in the distal bowel. This also showed a liver mass. This was followed up with an MRI. This was revealed to be focal nodular hyperplasia, 3.9 cm, segment 5.   Patient states he's lost approximately 10 pounds the last 2 weeks.    Other Problems (Ammie Eversole, LPN; X33443 D34-534 PM) Back Pain Gastroesophageal Reflux Disease Kidney Stone Melanoma  Past Surgical History (Ammie Eversole, LPN; X33443 D34-534 PM) Cataract Surgery Bilateral. Colon Polyp Removal - Colonoscopy Spinal Surgery - Lower Back Spinal Surgery - Neck  Diagnostic Studies History (Ammie Eversole, LPN; X33443 D34-534 PM) Colonoscopy within last year  Allergies (Ammie Eversole, LPN; X33443 579FGE PM) Penicillins  Medication History (Ammie Eversole, LPN; X33443 579FGE PM) ClonazePAM (1MG  Tablet, Oral) Active. Dexilant (60MG  Capsule DR, Oral) Active. FLUoxetine HCl (20MG  Tablet, Oral) Active. Gabapentin (300MG  Capsule, Oral) Active. Rosuvastatin Calcium (5MG  Tablet, Oral) Active. Qvar (40MCG/ACT Aerosol Soln, Inhalation) Active. ProAir HFA (108 (90 Base)MCG/ACT Aerosol Soln, Inhalation) Active. Medications Reconciled  Social History (Ammie Eversole, LPN; X33443 D34-534 PM) Caffeine use Carbonated beverages. No alcohol use No drug use Tobacco use Never  smoker.  Family History (Aleatha Borer, LPN; X33443 D34-534 PM) Alcohol Abuse Brother, Father. Arthritis Mother. Colon Polyps Brother. Heart Disease Father, Mother. Prostate Cancer Father.    Review of Systems (Ammie Eversole LPN; X33443 D34-534 PM) General Present- Fatigue and Weight Loss. Not Present- Appetite Loss, Chills, Fever, Night Sweats and Weight Gain. Skin Not Present- Change in Wart/Mole, Dryness, Hives, Jaundice, New Lesions, Non-Healing Wounds, Rash and Ulcer. HEENT Present- Wears glasses/contact lenses. Not Present- Earache, Hearing Loss, Hoarseness, Nose Bleed, Oral Ulcers, Ringing in the Ears, Seasonal Allergies, Sinus Pain, Sore Throat, Visual Disturbances and Yellow Eyes. Respiratory Present- Snoring. Not Present- Bloody sputum, Chronic Cough, Difficulty Breathing and Wheezing. Breast Not Present- Breast Mass, Breast Pain, Nipple Discharge and Skin Changes. Cardiovascular Present- Palpitations and Shortness of Breath. Not Present- Chest Pain, Difficulty Breathing Lying Down, Leg Cramps, Rapid Heart Rate and Swelling of Extremities. Gastrointestinal Present- Bloating and Constipation. Not Present- Abdominal Pain, Bloody Stool, Change in Bowel Habits, Chronic diarrhea, Difficulty Swallowing, Excessive gas, Gets full quickly at meals, Hemorrhoids, Indigestion, Nausea, Rectal Pain and Vomiting. Male Genitourinary Not Present- Blood in Urine, Change in Urinary Stream, Frequency, Impotence, Nocturia, Painful Urination, Urgency and Urine Leakage. Musculoskeletal Present- Back Pain. Not Present- Joint Pain, Joint Stiffness, Muscle Pain, Muscle Weakness and Swelling of Extremities. Neurological Present- Headaches. Not Present- Decreased Memory, Fainting, Numbness, Seizures, Tingling, Tremor, Trouble walking and Weakness. Psychiatric Not Present- Anxiety, Bipolar, Change in Sleep Pattern, Depression, Fearful and Frequent crying. Endocrine Not Present- Cold Intolerance, Excessive  Hunger, Hair Changes, Heat Intolerance, Hot flashes and New Diabetes. Hematology Not Present- Blood Thinners, Easy Bruising, Excessive bleeding, Gland problems, HIV and Persistent Infections.  Vitals (Ammie Eversole LPN; X33443 D34-534 PM) 10/09/2015 3:21 PM Weight: 195.4 lb Height: 70in Body Surface Area: 2.07 m Body Mass Index: 28.04  kg/m  Temp.: 97.63F(Oral)  Pulse: 74 (Regular)  BP: 118/72 (Sitting, Left Arm, Standard)       Physical Exam Ralene Ok MD; 10/09/2015 4:03 PM) General Mental Status-Alert. General Appearance-Consistent with stated age. Hydration-Well hydrated. Voice-Normal.  Chest and Lung Exam Chest and lung exam reveals -quiet, even and easy respiratory effort with no use of accessory muscles and on auscultation, normal breath sounds, no adventitious sounds and normal vocal resonance. Inspection Chest Wall - Normal. Back - normal.  Cardiovascular Cardiovascular examination reveals -normal heart sounds, regular rate and rhythm with no murmurs and normal pedal pulses bilaterally.  Abdomen Inspection Inspection of the abdomen reveals - No Hernias. Skin - Scar - no surgical scars. Palpation/Percussion Palpation and Percussion of the abdomen reveal - Soft, Non Tender, No Rebound tenderness, No Rigidity (guarding) and No hepatosplenomegaly. Auscultation Auscultation of the abdomen reveals - Bowel sounds normal.  Neurologic Neurologic evaluation reveals -alert and oriented x 3 with no impairment of recent or remote memory. Mental Status-Normal.  Musculoskeletal Normal Exam - Left-Upper Extremity Strength Normal and Lower Extremity Strength Normal. Normal Exam - Right-Upper Extremity Strength Normal and Lower Extremity Strength Normal.    Assessment & Plan Ralene Ok MD; 10/09/2015 4:04 PM) SMALL BOWEL MASS (K63.89) Impression: Patient is a 60 year old male with a distal small bowel mass.  1. At this time we'll  proceed to the operating for laparoscopic assisted small bowel resection. Secondary to the fact this may be needed terminal ileum patient may require right hemicolectomy. 2. I discussed with the patient the risks and benefits of the procedure to include but not limited to: Infection, bleeding, damage to structures, possible need further surgery, possible infection postoperatively. Patient was understanding and wished to proceed.

## 2015-10-17 NOTE — Anesthesia Postprocedure Evaluation (Signed)
Anesthesia Post Note  Patient: [name]  Procedure(s) Performed: Procedure(s) (LRB): LAPAROSCOPY DIAGNOSTIC (N/A) EXPLORATORY LAPAROTOMY, SMALL BOWEL RESECTION, WITH ANASTAMOSIS (N/A)  Patient location during evaluation: PACU Anesthesia Type: General Level of consciousness: awake and alert Pain management: pain level controlled Vital Signs Assessment: post-procedure vital signs reviewed and stable Respiratory status: spontaneous breathing, nonlabored ventilation, respiratory function stable and patient connected to nasal cannula oxygen Cardiovascular status: blood pressure returned to baseline and stable Postop Assessment: no signs of nausea or vomiting Anesthetic complications: no    Last Vitals:  Vitals:   10/17/15 1214 10/17/15 1657  BP: 124/80   Pulse: (!) 58   Resp: 16 10  Temp: 36.7 C 36.4 C    Last Pain:  Vitals:   10/17/15 1730  TempSrc:   PainSc: 5                  Tiajuana Amass

## 2015-10-17 NOTE — Interval H&P Note (Signed)
History and Physical Interval Note:  10/17/2015 2:18 PM  Caprice Drakos  has presented today for surgery, with the diagnosis of small bowel mass  The various methods of treatment have been discussed with the patient and family. After consideration of risks, benefits and other options for treatment, the patient has consented to  Procedure(s): LAPAROSCOPY DIAGNOSTIC (N/A) SMALL BOWEL RESECTION (N/A) POSSIBLE  RIGHT HEMI COLECTOMY (N/A) as a surgical intervention .  The patient's history has been reviewed, patient examined, no change in status, stable for surgery.  I have reviewed the patient's chart and labs.  Questions were answered to the patient's satisfaction.     Rosario Jacks., Anne Hahn

## 2015-10-17 NOTE — Op Note (Signed)
10/17/2015  4:38 PM  PATIENT:  Edward Steele  60 y.o. male  PRE-OPERATIVE DIAGNOSIS:  small bowel mass  POST-OPERATIVE DIAGNOSIS:  small bowel mass  PROCEDURE:  Procedure(s): LAPAROSCOPY DIAGNOSTIC (N/A) EXPLORATORY LAPAROTOMY SMALL BOWEL RESECTION AND ANATAMOSIS (N/A)   SURGEON:  Surgeon(s) and Role:    * Ralene Ok, MD - Primary  ANESTHESIA:   local and general  EBL:  Total I/O In: 1000 [I.V.:1000] Out: 425 [Urine:350; Blood:75]  BLOOD ADMINISTERED:none  DRAINS: none   LOCAL MEDICATIONS USED:  BUPIVICAINE   SPECIMEN:  Source of Specimen:  SMALL BOWEL (DISTAL ILEUM), AND PERITONEAL IMPLANT  DISPOSITION OF SPECIMEN:  PATHOLOGY  COUNTS:  YES  TOURNIQUET:  * No tourniquets in log *  DICTATION: .Dragon Dictation  Indications for procedure: Patient is a 60 year old male who was seen by Dr. Penelope Coop in underwent workup for anemia. Patient underwent EGD, colonoscopy, and capsule endoscopy. Capsule Endoscopy Visualized a Mass in the Distal Ileum. Patient was referred for excision of small bowel mass.  Details of procedure: After the patient was consented he was taken back to the operating room and placed in the supine position with bilateral SCDs in place. Patient went Gen. endotracheal anesthesia. Patient had a Foley catheter placed. He was prepped and draped in the standard fashion. Timeout was called all facts were verified.  A Veress needle technique was used to insufflate the abdomen to 15 mmHg in the left subcostal margin. Subsequent to this a 5 mm trocar and camera placed intra-abdominally. There was no injury to any intra-abdominal organs. At this time a 5 mm trocar was placed in the epigastrium under direct visualization as was a 5 mm left lower quadrant trocar. At this time the patient was positioned in Trendelenburg position. The small bowel was then run from the terminal ileum proximally. There was   retroperitoneal adhesions to the terminal ileum. I proceeded to  run the bowel proximally. At this time was evident there was a mass in the small bowel that was attached to the peritoneum just over the dome of the bladder. Blunt dissection was used to take this down. Upon doing so is apparent that there was a necrotic portions and time is excised enteric side of the bowel. At this time the decision was made to convert to an open laparotomy.  At this time a #10 blade was used to make a midline infraumbilical incision. Cautery was used to maintain hemostasis and dissection was taken down to the midline. The midline wound was retracted and the peritoneum was bluntly entered. The peritoneum and the fascia and extended to the sides of the skin incision. At this time I was able to bring up the area of the tumor into the wound. At this time mesenteric window was made both proximally and distally to the mass. This is possibly 5 cm from the mass. A 75 GIA stapler was used to transect the bowel. A ligature device was used to ligate the mesentery in a V-like fashion.  At this time the specimen was sent to pathology. I proceeded to visualize the peritoneal extension of the small bowel mass. I proceeded to incise the peritoneum. Blunt dissection was used to dissect away the bladder from the peritoneum. This was easily done. The did not appear to be any penetration into the bladder. Peritoneal extension of the small bowel tumor was then circumferentially cauterized away from the rest the peritoneum. At this time 300 mL of saline were then infiltrated into the Foley catheter and bladder.  This was then clamped off. Visualizing the posterior surface of the dome of the bladder there was no apparent leaks however superficial muscle fibers were able to be seen. At this time a 3-0 Vicryl was used in interrupted fashion to reapproximate the peritoneum over the posterior dome of the bladder.  The terminal ileum was then dissected away from the retroperitoneal attachments. The staple lines of each  end of the small bowel was easily visualized. I proceeded to eviscerate the small bowel and run the small bowel from ligament of Treitz distally. There was no further small bowel masses could be palpated or seen. At this time a core of the staple line was then cut off. A 75 GIA blue load stapler was then used to create an antimesenteric anastomosis. The common enterotomy was then closed using a 75 GIA blue load stapler, stapling lines were offset. At this time a 3-0 silk was used to reinforce the apex. At this time the mesenteric defect was reapproximated using 3-0 silk's in a figure-of-eight fashion.   At this time the abdominal cavity was irrigated out with sterile saline. The NG tube was palpated for appropriate positioning. The liver was palpated and there was an apparent palpable firm mass at the edge of the liver was consistent with focal nodular hyperplasia on MRI.  At this time a #1 PDS single stranded was used to reapproximate the fascia in a standard running fashion 2. The subcutaneous skin was irrigated out. The trocar sites and skin were then reapproximated using skin staples.  The patient tied the procedure well was taken to the recovery room in stable condition. PLAN OF CARE: Admit to inpatient   PATIENT DISPOSITION:  PACU - hemodynamically stable.   Delay start of Pharmacological VTE agent (>24hrs) due to surgical blood loss or risk of bleeding: yes

## 2015-10-18 ENCOUNTER — Encounter (HOSPITAL_COMMUNITY): Payer: Self-pay | Admitting: General Surgery

## 2015-10-18 LAB — CBC
HCT: 32.2 % — ABNORMAL LOW (ref 39.0–52.0)
Hemoglobin: 10.7 g/dL — ABNORMAL LOW (ref 13.0–17.0)
MCH: 27.8 pg (ref 26.0–34.0)
MCHC: 33.2 g/dL (ref 30.0–36.0)
MCV: 83.6 fL (ref 78.0–100.0)
PLATELETS: 178 10*3/uL (ref 150–400)
RBC: 3.85 MIL/uL — ABNORMAL LOW (ref 4.22–5.81)
RDW: 20.1 % — AB (ref 11.5–15.5)
WBC: 11 10*3/uL — AB (ref 4.0–10.5)

## 2015-10-18 LAB — BASIC METABOLIC PANEL
Anion gap: 3 — ABNORMAL LOW (ref 5–15)
BUN: 13 mg/dL (ref 6–20)
CALCIUM: 8.1 mg/dL — AB (ref 8.9–10.3)
CO2: 28 mmol/L (ref 22–32)
CREATININE: 0.85 mg/dL (ref 0.61–1.24)
Chloride: 105 mmol/L (ref 101–111)
GFR calc Af Amer: >60 mL/min (ref >60)
Glucose, Bld: 139 mg/dL — ABNORMAL HIGH (ref 65–99)
POTASSIUM: 3.8 mmol/L (ref 3.5–5.1)
SODIUM: 136 mmol/L (ref 135–145)

## 2015-10-18 MED ORDER — PHENOL 1.4 % MT LIQD
1.0000 | OROMUCOSAL | Status: DC | PRN
  Administered 2015-10-18: 1 via OROMUCOSAL
  Filled 2015-10-18: qty 177

## 2015-10-18 NOTE — Progress Notes (Signed)
1 Day Post-Op  Subjective: Pt w/ some soreness today. Up to side of the bed last night  Objective: Vital signs in last 24 hours: Temp:  [97.6 F (36.4 C)-98.4 F (36.9 C)] 98.3 F (36.8 C) (08/09 0544) Pulse Rate:  [58-79] 77 (08/09 0544) Resp:  [10-16] 14 (08/09 0544) BP: (103-124)/(50-80) 111/57 (08/09 0544) SpO2:  [96 %-100 %] 100 % (08/09 0544) Weight:  [89.4 kg (197 lb)] 89.4 kg (197 lb) (08/08 1233) Last BM Date: 10/17/15  Intake/Output from previous day: 08/08 0701 - 08/09 0700 In: 2500 [I.V.:2500] Out: 950 [Urine:875; Blood:75] Intake/Output this shift: Total I/O In: 0  Out: 425 [Urine:425]  General appearance: alert and cooperative Cardio: regular rate and rhythm, S1, S2 normal, no murmur, click, rub or gallop GI: soft, approp ttp, ND, dressing c/d/i  Lab Results:   Recent Labs  10/18/15 0444  WBC 11.0*  HGB 10.7*  HCT 32.2*  PLT 178   BMET  Recent Labs  10/18/15 0444  NA 136  K 3.8  CL 105  CO2 28  GLUCOSE 139*  BUN 13  CREATININE 0.85  CALCIUM 8.1*   PT/INR No results for input(s): LABPROT, INR in the last 72 hours. ABG No results for input(s): PHART, HCO3 in the last 72 hours.  Invalid input(s): PCO2, PO2  Studies/Results: No results found.  Anti-infectives: Anti-infectives    Start     Dose/Rate Route Frequency Ordered Stop   10/18/15 0600  cefoTEtan in Dextrose 5% (CEFOTAN) IVPB 2 g  Status:  Discontinued     2 g Intravenous On call to O.R. 10/17/15 1208 10/17/15 1425   10/18/15 0200  ciprofloxacin (CIPRO) IVPB 400 mg     400 mg 200 mL/hr over 60 Minutes Intravenous Every 12 hours 10/17/15 1839 10/19/15 0159   10/17/15 2000  metroNIDAZOLE (FLAGYL) IVPB 500 mg     500 mg 100 mL/hr over 60 Minutes Intravenous Every 8 hours 10/17/15 1839 10/18/15 1959   10/17/15 1430  ciprofloxacin (CIPRO) IVPB 400 mg     400 mg 200 mL/hr over 60 Minutes Intravenous  Once 10/17/15 1424 10/17/15 1524      Assessment/Plan: s/p  Procedure(s): LAPAROSCOPY DIAGNOSTIC (N/A) EXPLORATORY LAPAROTOMY, SMALL BOWEL RESECTION, WITH ANASTAMOSIS (N/A) OK for Ice chips Sit in chair/ ambulate with asst Will keep foley in likely x 2-3d as was near resection area.   LOS: 1 day    Rosario Jacks., Anne Hahn 10/18/2015

## 2015-10-19 MED ORDER — OXYCODONE-ACETAMINOPHEN 5-325 MG PO TABS
1.0000 | ORAL_TABLET | ORAL | Status: DC | PRN
  Administered 2015-10-19 (×3): 1 via ORAL
  Administered 2015-10-19: 2 via ORAL
  Administered 2015-10-20 (×3): 1 via ORAL
  Administered 2015-10-20: 2 via ORAL
  Administered 2015-10-21 (×3): 1 via ORAL
  Administered 2015-10-21: 2 via ORAL
  Administered 2015-10-21 – 2015-10-22 (×4): 1 via ORAL
  Filled 2015-10-19 (×7): qty 1
  Filled 2015-10-19 (×2): qty 2
  Filled 2015-10-19: qty 1
  Filled 2015-10-19 (×2): qty 2
  Filled 2015-10-19: qty 1
  Filled 2015-10-19: qty 2
  Filled 2015-10-19 (×3): qty 1

## 2015-10-19 MED ORDER — CLONAZEPAM 1 MG PO TABS
1.0000 mg | ORAL_TABLET | Freq: Two times a day (BID) | ORAL | Status: DC
  Administered 2015-10-19 – 2015-10-22 (×6): 1 mg via ORAL
  Filled 2015-10-19 (×6): qty 1

## 2015-10-19 MED ORDER — MORPHINE SULFATE (PF) 2 MG/ML IV SOLN
1.0000 mg | INTRAVENOUS | Status: DC | PRN
  Administered 2015-10-19: 1 mg via INTRAVENOUS
  Filled 2015-10-19: qty 1

## 2015-10-19 NOTE — Progress Notes (Addendum)
2 Days Post-Op  Subjective: Pt doing well.  Ambulating No bowel function   Objective: Vital signs in last 24 hours: Temp:  [98.4 F (36.9 C)-98.9 F (37.2 C)] 98.6 F (37 C) (08/10 0536) Pulse Rate:  [63-86] 76 (08/10 0536) Resp:  [16-18] 16 (08/10 0536) BP: (94-117)/(50-69) 117/62 (08/10 0536) SpO2:  [90 %-99 %] 93 % (08/10 0536) Last BM Date: 10/17/15  Intake/Output from previous day: 08/09 0701 - 08/10 0700 In: 3440 [I.V.:2940; IV Piggyback:500] Out: 1300 [Urine:900; Emesis/NG output:400] Intake/Output this shift: No intake/output data recorded.  General appearance: alert and cooperative GI: soft, approp ttp, ND, hypoactive BS, incision c/d/i  Lab Results:   Recent Labs  10/18/15 0444  WBC 11.0*  HGB 10.7*  HCT 32.2*  PLT 178   BMET  Recent Labs  10/18/15 0444  NA 136  K 3.8  CL 105  CO2 28  GLUCOSE 139*  BUN 13  CREATININE 0.85  CALCIUM 8.1*   PT/INR No results for input(s): LABPROT, INR in the last 72 hours. ABG No results for input(s): PHART, HCO3 in the last 72 hours.  Invalid input(s): PCO2, PO2  Studies/Results: No results found.  Anti-infectives: Anti-infectives    Start     Dose/Rate Route Frequency Ordered Stop   10/18/15 0600  cefoTEtan in Dextrose 5% (CEFOTAN) IVPB 2 g  Status:  Discontinued     2 g Intravenous On call to O.R. 10/17/15 1208 10/17/15 1425   10/18/15 0200  ciprofloxacin (CIPRO) IVPB 400 mg     400 mg 200 mL/hr over 60 Minutes Intravenous Every 12 hours 10/17/15 1839 10/18/15 1517   10/17/15 2000  metroNIDAZOLE (FLAGYL) IVPB 500 mg     500 mg 100 mL/hr over 60 Minutes Intravenous Every 8 hours 10/17/15 1839 10/18/15 1316   10/17/15 1430  ciprofloxacin (CIPRO) IVPB 400 mg     400 mg 200 mL/hr over 60 Minutes Intravenous  Once 10/17/15 1424 10/17/15 1524      Assessment/Plan: s/p Procedure(s): LAPAROSCOPY DIAGNOSTIC (N/A) EXPLORATORY LAPAROTOMY, SMALL BOWEL RESECTION, WITH ANASTAMOSIS (N/A) resume home  klonopin  PO pain Rx DC NGT con't to amb Await bowel function Path pending Con't Foley to keep bladder decompressed   LOS: 2 days    Rosario Jacks., Anne Hahn 10/19/2015

## 2015-10-20 MED ORDER — GABAPENTIN 300 MG PO CAPS
300.0000 mg | ORAL_CAPSULE | Freq: Every day | ORAL | Status: DC
  Administered 2015-10-20 – 2015-10-21 (×2): 300 mg via ORAL
  Filled 2015-10-20 (×2): qty 1

## 2015-10-20 MED ORDER — FLUOXETINE HCL 20 MG PO CAPS
40.0000 mg | ORAL_CAPSULE | Freq: Two times a day (BID) | ORAL | Status: DC
  Administered 2015-10-20 – 2015-10-22 (×5): 40 mg via ORAL
  Filled 2015-10-20 (×5): qty 2

## 2015-10-20 NOTE — Progress Notes (Signed)
3 Days Post-Op  Subjective: PT doing well.  No bowel function AMbulating well  Objective: Vital signs in last 24 hours: Temp:  [97.3 F (36.3 C)-98.7 F (37.1 C)] 97.3 F (36.3 C) (08/11 0523) Pulse Rate:  [55-82] 55 (08/11 0523) Resp:  [14-16] 16 (08/11 0523) BP: (103-128)/(65-71) 127/68 (08/11 0523) SpO2:  [93 %-97 %] 97 % (08/11 0523) Last BM Date: 10/17/15  Intake/Output from previous day: 08/10 0701 - 08/11 0700 In: 1260 [P.O.:60; I.V.:1200] Out: 1975 [Urine:1975] Intake/Output this shift: No intake/output data recorded.  General appearance: alert and cooperative Cardio: regular rate and rhythm, S1, S2 normal, no murmur, click, rub or gallop GI: soft, approp ttp, ND, active BS, incision c/d/i  Lab Results:   Recent Labs  10/18/15 0444  WBC 11.0*  HGB 10.7*  HCT 32.2*  PLT 178   BMET  Recent Labs  10/18/15 0444  NA 136  K 3.8  CL 105  CO2 28  GLUCOSE 139*  BUN 13  CREATININE 0.85  CALCIUM 8.1*   PT/INR No results for input(s): LABPROT, INR in the last 72 hours. ABG No results for input(s): PHART, HCO3 in the last 72 hours.  Invalid input(s): PCO2, PO2  Studies/Results: No results found.  Anti-infectives: Anti-infectives    Start     Dose/Rate Route Frequency Ordered Stop   10/18/15 0600  cefoTEtan in Dextrose 5% (CEFOTAN) IVPB 2 g  Status:  Discontinued     2 g Intravenous On call to O.R. 10/17/15 1208 10/17/15 1425   10/18/15 0200  ciprofloxacin (CIPRO) IVPB 400 mg     400 mg 200 mL/hr over 60 Minutes Intravenous Every 12 hours 10/17/15 1839 10/18/15 1517   10/17/15 2000  metroNIDAZOLE (FLAGYL) IVPB 500 mg     500 mg 100 mL/hr over 60 Minutes Intravenous Every 8 hours 10/17/15 1839 10/18/15 1316   10/17/15 1430  ciprofloxacin (CIPRO) IVPB 400 mg     400 mg 200 mL/hr over 60 Minutes Intravenous  Once 10/17/15 1424 10/17/15 1524      Assessment/Plan: s/p Procedure(s): LAPAROSCOPY DIAGNOSTIC (N/A) EXPLORATORY LAPAROTOMY, SMALL BOWEL  RESECTION, WITH ANASTAMOSIS (N/A) Advance diet to Fulls DC foley OK to shower Mobilize Awaiting Path  LOS: 3 days    Rosario Jacks., [facility] 10/20/2015

## 2015-10-21 NOTE — Progress Notes (Signed)
4 Days Post-Op  Subjective: Reports having a BM yesterday but no flatus or BM since.  Feels bloated.  Objective: Vital signs in last 24 hours: Temp:  [98 F (36.7 C)-99.6 F (37.6 C)] 98 F (36.7 C) (08/12 0515) Pulse Rate:  [63-75] 63 (08/12 0515) Resp:  [15-16] 16 (08/12 0515) BP: (107-139)/(64-85) 107/64 (08/12 0515) SpO2:  [94 %-97 %] 96 % (08/12 0915) Last BM Date: 10/20/15  Intake/Output from previous day: 08/11 0701 - 08/12 0700 In: 1320 [P.O.:120; I.V.:1200] Out: 0  Intake/Output this shift: No intake/output data recorded.  PE: General- In NAD Abdomen- soft, some distension, incisions clean and intact, few bowel sounds  Lab Results:  No results for input(s): WBC, HGB, HCT, PLT in the last 72 hours. BMET No results for input(s): NA, K, CL, CO2, GLUCOSE, BUN, CREATININE, CALCIUM in the last 72 hours. PT/INR No results for input(s): LABPROT, INR in the last 72 hours. Comprehensive Metabolic Panel:    Component Value Date/Time   NA 136 10/18/2015 0444   K 3.8 10/18/2015 0444   CL 105 10/18/2015 0444   CO2 28 10/18/2015 0444   BUN 13 10/18/2015 0444   CREATININE 0.85 10/18/2015 0444   GLUCOSE 139 (H) 10/18/2015 0444   CALCIUM 8.1 (L) 10/18/2015 0444     Studies/Results: No results found.  Anti-infectives: Anti-infectives    Start     Dose/Rate Route Frequency Ordered Stop   10/18/15 0600  cefoTEtan in Dextrose 5% (CEFOTAN) IVPB 2 g  Status:  Discontinued     2 g Intravenous On call to O.R. 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    LOS: 4 days   Plan: Keep on liquids until bowel function improves.   Edward Steele 10/21/2015

## 2015-10-21 NOTE — Care Management Note (Addendum)
Case Management Note  Patient Details  Name: Edward Steele MRN: [id number] Date of Birth: 1955-06-17  Subjective/Objective:   small bowel tumor (B-cell Lymphoma), small bowel resection 10/17/2015, ileus                Action/Plan: Discharge Planning: NCM spoke to pt and wife at bedside. Pt states he would like his PCP to have a copy of report, and H&P. Faxed H&P, op note to pt's PCP, Dr Margo Common. Will fax dc summary when available.   PCP - Dr Margo Common, [phone] fax [phone]  10/22/2015 Faxed H&P and op note to PCP, Dr. Cletus Gash.   Expected Discharge Date:                  Expected Discharge Plan:  Home/Self Care  In-House Referral:  NA  Discharge planning Services  CM Consult  Post Acute Care Choice:  NA Choice offered to:  NA  DME Arranged:  N/A DME Agency:  NA  HH Arranged:  NA HH Agency:  NA  Status of Service:  Completed, signed off  If discussed at [place] of Stay Meetings, dates discussed:    Additional Comments:  [name], RN 10/21/2015, 3:14 PM

## 2015-10-22 MED ORDER — OXYCODONE-ACETAMINOPHEN 5-325 MG PO TABS: 1.0000 | ORAL_TABLET | ORAL | 0 refills | Status: DC | PRN

## 2015-10-22 NOTE — Discharge Instructions (Signed)
[facility], [place] [phone]  OPEN ABDOMINAL SURGERY: POST OP INSTRUCTIONS  Always review your discharge instruction sheet given to you by the facility where your surgery was performed.  IF YOU HAVE DISABILITY OR FAMILY LEAVE FORMS, YOU MUST BRING THEM TO THE OFFICE FOR PROCESSING.  PLEASE DO NOT GIVE THEM TO YOUR DOCTOR.  1. A prescription for pain medication may be given to you upon discharge.  Take your pain medication as prescribed, if needed.  If narcotic pain medicine is not needed, then you may take acetaminophen (Tylenol) or ibuprofen (Advil) as needed. 2. Take your usually prescribed medications unless otherwise directed. 3. If you need a refill on your pain medication, please contact your pharmacy. They will contact our office to request authorization.  Prescriptions will not be filled after 5pm or on week-ends. 4. You should follow a light diet the first few days after arrival home, such as soup and crackers, pudding, etc.unless your doctor has advised otherwise. A high-fiber, low fat diet can be resumed as tolerated.   Be sure to include lots of fluids daily. Most patients will experience some swelling and bruising on the chest and neck area.  Ice packs will help.  Swelling and bruising can take several days to resolve 5. Most patients will experience some swelling and bruising in the area of the incision. Ice pack will help. Swelling and bruising can take several days to resolve..  6. It is common to experience some constipation if taking pain medication after surgery.  Increasing fluid intake and taking a stool softener will usually help or prevent this problem from occurring.  A mild laxative (Milk of Magnesia or Miralax) should be taken according to package directions if there are no bowel movements after 48 hours. 7.  You may have steri-strips (small skin tapes) in place directly over the incision.  These strips should be left on the skin for 7-10 days.  If your  surgeon used skin glue on the incision, you may shower in 24 hours.  The glue will flake off over the next 2-3 weeks.  Any sutures or staples will be removed at the office during your follow-up visit. You may find that a light gauze bandage over your incision may keep your staples from being rubbed or pulled. You may shower and replace the bandage daily. 8. ACTIVITIES:  You may resume regular (light) daily activities beginning the next day--such as daily self-care, walking, climbing stairs--gradually increasing activities as tolerated.  You may have sexual intercourse when it is comfortable.  Refrain from any heavy lifting or straining for 6 weeks-nothing over 10 pounds. a. You may drive when you no longer are taking prescription pain medication, you can comfortably wear a seatbelt, and you can safely maneuver your car and apply brakes b. Return to Work: When released by MD___________________________________ 48. You should see your doctor or a nurse in the office later this week for staple removal.  The office will call you to make the appointment.  We will also refer you to a medical oncologist. OTHER INSTRUCTIONS:  _____________________________________________________________ _____________________________________________________________  WHEN TO CALL YOUR DOCTOR: 1. Fever over 101.0 2. Inability to urinate 3. Nausea and/or vomiting 4. Extreme swelling or bruising 5. Continued bleeding from incision. 6. Increased pain, redness, or drainage from the incision.  The clinic staff is available to answer your questions during regular business hours.  Please dont hesitate to call and ask to speak to one of the nurses  if you have concerns.  For further questions, please visit [email or web address]

## 2015-10-22 NOTE — Progress Notes (Signed)
5 Days Post-Op  Subjective: Had a large BM.  Walking.  Tolerating full liquids.  Objective: Vital signs in last 24 hours: Temp:  [97.5 F (36.4 C)-99.4 F (37.4 C)] 98.9 F (37.2 C) (08/13 0625) Pulse Rate:  [73-77] 73 (08/13 0625) Resp:  [16] 16 (08/13 0625) BP: (125-141)/(68-80) 141/80 (08/13 0625) SpO2:  [96 %-99 %] 99 % (08/13 0625) Last BM Date: 10/20/15  Intake/Output from previous day: 08/12 0701 - 08/13 0700 In: 3220 [P.O.:820; I.V.:2400] Out: -  Intake/Output this shift: No intake/output data recorded.  PE: General- In NAD Abdomen- soft, less distension, incisions clean and intact  Lab Results:  No results for input(s): WBC, HGB, HCT, PLT in the last 72 hours. BMET No results for input(s): NA, K, CL, CO2, GLUCOSE, BUN, CREATININE, CALCIUM in the last 72 hours. PT/INR No results for input(s): LABPROT, INR in the last 72 hours. Comprehensive Metabolic Panel:    Component Value Date/Time   NA 136 10/18/2015 0444   K 3.8 10/18/2015 0444   CL 105 10/18/2015 0444   CO2 28 10/18/2015 0444   BUN 13 10/18/2015 0444   CREATININE 0.85 10/18/2015 0444   GLUCOSE 139 (H) 10/18/2015 0444   CALCIUM 8.1 (L) 10/18/2015 0444     Studies/Results: No results found.  Anti-infectives: Anti-infectives    Start     Dose/Rate Route Frequency Ordered Stop   10/18/15 0600  cefoTEtan in Dextrose 5% (CEFOTAN) IVPB 2 g  Status:  Discontinued     2 g Intravenous On call to O.R. 10/17/15 1208 10/17/15 1425   10/18/15 0200  ciprofloxacin (CIPRO) IVPB 400 mg     400 mg 200 mL/hr over 60 Minutes Intravenous Every 12 hours 10/17/15 1839 10/18/15 1517   10/17/15 2000  metroNIDAZOLE (FLAGYL) IVPB 500 mg     500 mg 100 mL/hr over 60 Minutes Intravenous Every 8 hours 10/17/15 1839 10/18/15 1316   10/17/15 1430  ciprofloxacin (CIPRO) IVPB 400 mg     400 mg 200 mL/hr over 60 Minutes Intravenous  Once 10/17/15 1424 10/17/15 1524      Assessment Active Problems:  Small bowel tumor  (B-cell lymphoma) s/p small bowel resection 10/17/15-bowel function improving.    LOS: 5 days   Plan: Advance to lowfat diet and if tolerated, discharge this afternoon.  Instructions given to him.   Edward Steele 10/22/2015

## 2015-10-25 ENCOUNTER — Ambulatory Visit (HOSPITAL_BASED_OUTPATIENT_CLINIC_OR_DEPARTMENT_OTHER): Payer: BLUE CROSS/BLUE SHIELD | Admitting: Hematology and Oncology

## 2015-10-25 ENCOUNTER — Ambulatory Visit (HOSPITAL_BASED_OUTPATIENT_CLINIC_OR_DEPARTMENT_OTHER): Payer: BLUE CROSS/BLUE SHIELD

## 2015-10-25 ENCOUNTER — Encounter: Payer: Self-pay | Admitting: Hematology and Oncology

## 2015-10-25 ENCOUNTER — Ambulatory Visit: Payer: Self-pay | Admitting: General Surgery

## 2015-10-25 ENCOUNTER — Telehealth: Payer: Self-pay | Admitting: Hematology and Oncology

## 2015-10-25 VITALS — BP 118/69 | HR 68 | Temp 97.9°F | Resp 18 | Ht 70.0 in | Wt 189.5 lb

## 2015-10-25 DIAGNOSIS — C8339 Diffuse large B-cell lymphoma, extranodal and solid organ sites: Secondary | ICD-10-CM | POA: Insufficient documentation

## 2015-10-25 DIAGNOSIS — D649 Anemia, unspecified: Secondary | ICD-10-CM | POA: Diagnosis not present

## 2015-10-25 LAB — CBC WITH DIFFERENTIAL/PLATELET
BASO%: 0.6 % (ref 0.0–2.0)
BASOS ABS: 0 10*3/uL (ref 0.0–0.1)
EOS%: 4.2 % (ref 0.0–7.0)
Eosinophils Absolute: 0.3 10*3/uL (ref 0.0–0.5)
HEMATOCRIT: 37.4 % — AB (ref 38.4–49.9)
HEMOGLOBIN: 12.3 g/dL — AB (ref 13.0–17.1)
LYMPH%: 18.2 % (ref 14.0–49.0)
MCH: 27.3 pg (ref 27.2–33.4)
MCHC: 32.9 g/dL (ref 32.0–36.0)
MCV: 83.1 fL (ref 79.3–98.0)
MONO#: 0.5 10*3/uL (ref 0.1–0.9)
MONO%: 7 % (ref 0.0–14.0)
NEUT%: 70 % (ref 39.0–75.0)
NEUTROS ABS: 5.3 10*3/uL (ref 1.5–6.5)
PLATELETS: 301 10*3/uL (ref 140–400)
RBC: 4.5 10*6/uL (ref 4.20–5.82)
RDW: 19.4 % — AB (ref 11.0–14.6)
WBC: 7.6 10*3/uL (ref 4.0–10.3)
lymph#: 1.4 10*3/uL (ref 0.9–3.3)

## 2015-10-25 LAB — COMPREHENSIVE METABOLIC PANEL
ALBUMIN: 3 g/dL — AB (ref 3.5–5.0)
ALT: 41 U/L (ref 0–55)
ANION GAP: 10 meq/L (ref 3–11)
AST: 52 U/L — ABNORMAL HIGH (ref 5–34)
Alkaline Phosphatase: 82 U/L (ref 40–150)
BILIRUBIN TOTAL: 0.41 mg/dL (ref 0.20–1.20)
BUN: 12.3 mg/dL (ref 7.0–26.0)
CO2: 27 meq/L (ref 22–29)
CREATININE: 0.9 mg/dL (ref 0.7–1.3)
Calcium: 9.5 mg/dL (ref 8.4–10.4)
Chloride: 105 mEq/L (ref 98–109)
EGFR: >90 mL/min/{1.73_m2} (ref >90)
GLUCOSE: 97 mg/dL (ref 70–140)
Potassium: 3.3 mEq/L — ABNORMAL LOW (ref 3.5–5.1)
SODIUM: 142 meq/L (ref 136–145)
TOTAL PROTEIN: 8.2 g/dL (ref 6.4–8.3)

## 2015-10-25 LAB — IRON AND TIBC
%SAT: 10 % — AB (ref 20–55)
Iron: 29 ug/dL — ABNORMAL LOW (ref 42–163)
TIBC: 298 ug/dL (ref 202–409)
UIBC: 269 ug/dL (ref 117–376)

## 2015-10-25 LAB — LACTATE DEHYDROGENASE: LDH: 184 U/L (ref 125–245)

## 2015-10-25 LAB — FERRITIN: Ferritin: 60 ng/ml (ref 22–316)

## 2015-10-25 NOTE — H&P (Signed)
History of Present Illness Ralene Ok MD; 10/25/2015 1:42 PM) Patient words: follow up.  The patient is a 60 year old male presenting for a post-operative visit. Patient is a 60 year old male status post small bowel resection for mass, and anemia. Patient's been doing well. He is had some tenderness to lower portion of his midline incision. Patient states she's had some leakage from his incision however this stopped over the last 24 hours.  Patient has now with oncology and is preparing for chemotherapy in approximately 3 weeks. Patient will require Port-A-Cath placement.   Allergies (Ammie Eversole, LPN; 075-GRM 075-GRM PM) Penicillins  Medication History (Ammie Eversole, LPN; 075-GRM 075-GRM PM) ClonazePAM (1MG  Tablet, Oral) Active. Dexilant (60MG  Capsule DR, Oral) Active. FLUoxetine HCl (20MG  Tablet, Oral) Active. Gabapentin (300MG  Capsule, Oral) Active. Rosuvastatin Calcium (5MG  Tablet, Oral) Active. Qvar (40MCG/ACT Aerosol Soln, Inhalation) Active. ProAir HFA (108 (90 Base)MCG/ACT Aerosol Soln, Inhalation) Active. Medications Reconciled  Vitals (Ammie Eversole LPN; 075-GRM [id number] PM) 10/25/2015 1:27 PM Weight: 189.8 lb Height: 72in Body Surface Area: 2.08 m Body Mass Index: 25.74 kg/m  Temp.: 98.73F(Oral)  Pulse: 86 (Regular)  BP: 124/70 (Sitting, Left Arm, Standard)    Physical Exam Ralene Ok MD; 10/25/2015 1:42 PM) Abdomen Note: Inferior midline wound area with some mild erythema, no leakage of fluid, other incision sites clean dry and intact.   Assessment & Plan Ralene Ok MD; 10/25/2015 1:46 PM) LYMPHOMA OF SMALL BOWEL (C85.93) Impression: Patient is a 60 year old male status post small bowel resection, exploratory laparoscopy for small bowel lymphoma 1. Will remove staples today. 2. Inferior portion of the midline wound appears to have mild cellulitis. We'll prescribe him clindamycin. 3. We'll plan on Port-A-Cath  placement. 4. We discussed the risks and benefits of Port-A-Cath placement to include but not limited to: Infection, bleeding, damage to structures, possible pneumothorax, possible need for chest tube catheter. Patient was understanding and wished to proceed.

## 2015-10-25 NOTE — Progress Notes (Signed)
[facility] NOTE  Patient Care Team: Margo Common, MD as PCP - General (Internal Medicine)  CHIEF COMPLAINTS/PURPOSE OF CONSULTATION:  Newly diagnosed diffuse large B-cell lymphoma  HISTORY OF PRESENTING ILLNESS:  Edward Steele 60 y.o. 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 Patient was having abdominal problems for the past several months and has been losing weight. He had CT of the abdomen that showed small intestinal inflammation. He had an MRI of the abdomen that did not reveal anything of significance. He subsequently had upper endoscopy and a capsule endoscopy. The capsule endoscopy identified a small bowel lesion. He then subsequently underwent laparoscopic surgery with resection of the small bowel tumor in addition to the peritoneal implant. There was no involvement of adjacent organs. Since the surgery is done reasonably well although he continues to have abdominal discomfort and decreased appetite. He has noticed some serous discharge from the surgical wound and he will be seeing his surgeon today.  I reviewed her records extensively and collaborated the history with the patient.  SUMMARY OF ONCOLOGIC HISTORY:   Diffuse large B-cell lymphoma of extranodal site ([place])   10/17/2015 Initial Diagnosis    Small intestine resection, proximal ileum: Involvement by diffuse large B-cell lymphoma,. Biopsy of peritoneal implant: Invol by a DLBCL.positive for CD20, CD79a,CD10, bcl-6, and bcl-2. CD3 and CD5 highlight scattered admixed T-cells. CD34 and TdT neg     MEDICAL HISTORY:  Past Medical History:  Diagnosis Date  . Anemia    oral iron supplement used  . Chronic back pain    lower back  . Depression   . GERD (gastroesophageal reflux disease)   . History of kidney stones    x 4 episodes-no recent issues  . Recurrent sinus infections    past history-none recent " Inhalers were for this-  no use im many yrs"    SURGICAL HISTORY: Past Surgical History:  Procedure Laterality Date  . ANTERIOR FUSION CERVICAL SPINE    . BACK SURGERY     x2 lower back with fusion  . BOWEL RESECTION N/A 10/17/2015   Procedure: EXPLORATORY LAPAROTOMY, SMALL BOWEL RESECTION, WITH ANASTAMOSIS;  Surgeon: Ralene Ok, MD;  Location: [facility];  Service: General;  Laterality: N/A;  . CATARACT EXTRACTION, BILATERAL Bilateral   . EYE SURGERY Left    corneal tranplant  . LAPAROSCOPY N/A 10/17/2015   Procedure: LAPAROSCOPY DIAGNOSTIC;  Surgeon: Ralene Ok, MD;  Location: [facility];  Service: General;  Laterality: N/A;    SOCIAL HISTORY: Social History   Social History  . Marital status: Married    Spouse name: N/A  . Number of children: N/A  . Years of education: N/A   Occupational History  . Not on file.   Social History Main Topics  . Smoking status: Never Smoker  . Smokeless tobacco: Never Used  . Alcohol use No  . Drug use: No  . Sexual activity: Yes   Other Topics Concern  . Not on file   Social History Narrative  . No narrative on file    FAMILY HISTORY: No family history on file.  ALLERGIES:  is allergic to penicillin g.  MEDICATIONS:  Current Outpatient Prescriptions  Medication Sig Dispense Refill  . albuterol (PROVENTIL HFA;VENTOLIN HFA) 108 (90 Base) MCG/ACT inhaler Inhale 2 puffs into the lungs every 6 (six) hours as needed for wheezing or shortness of breath.     . beclomethasone (QVAR) 40 MCG/ACT inhaler  Inhale 1 puff into the lungs daily.    . clonazePAM (KLONOPIN) 1 MG tablet Take 1 mg by mouth 2 (two) times daily.    Marland Kitchen DEXILANT 60 MG capsule Take 60 mg by mouth daily.    Marland Kitchen FLUoxetine (PROZAC) 20 MG tablet Take 40 mg by mouth 2 (two) times daily.    Marland Kitchen gabapentin (NEURONTIN) 300 MG capsule Take 300 mg by mouth at bedtime as needed (nerve pain).     Marland Kitchen oxyCODONE-acetaminophen (PERCOCET/ROXICET) 5-325 MG tablet Take 1-2 tablets by mouth every 4 (four) hours as  needed for moderate pain. 30 tablet 0  . rosuvastatin (CRESTOR) 5 MG tablet Take 5 mg by mouth daily.     No current facility-administered medications for this visit.     REVIEW OF SYSTEMS:   Constitutional: Denies fevers, chills or abnormal night sweats Eyes: Denies blurriness of vision, double vision or watery eyes Ears, nose, mouth, throat, and face: Denies mucositis or sore throat Respiratory: Denies cough, dyspnea or wheezes Cardiovascular: Denies palpitation, chest discomfort or lower extremity swelling Gastrointestinal: Surgical incisions and staples were noted no hepatosplenomegaly. Skin: Denies abnormal skin rashes Lymphatics: Denies new lymphadenopathy or easy bruising Neurological:Denies numbness, tingling or new weaknesses Behavioral/Psych: Mood is stable, no new changes  All other systems were reviewed with the patient and are negative.  PHYSICAL EXAMINATION: ECOG PERFORMANCE STATUS: 1 - Symptomatic but completely ambulatory  Vitals:   10/25/15 1017  BP: 118/69  Pulse: 68  Resp: 18  Temp: 97.9 F (36.6 C)   Filed Weights   10/25/15 1017  Weight: 189 lb 8 oz (86 kg)    GENERAL:alert, no distress and comfortable SKIN: skin color, texture, turgor are normal, no rashes or significant lesions EYES: normal, conjunctiva are pink and non-injected, sclera clear OROPHARYNX:no exudate, no erythema and lips, buccal mucosa, and tongue normal  NECK: supple, thyroid normal size, non-tender, without nodularity LYMPH:  no palpable lymphadenopathy in the cervical, axillary or inguinal LUNGS: clear to auscultation and percussion with normal breathing effort HEART: regular rate & rhythm and no murmurs and no lower extremity edema ABDOMEN:abdomen soft, non-tender and normal bowel sounds Musculoskeletal:no cyanosis of digits and no clubbing  PSYCH: alert & oriented x 3 with fluent speech NEURO: no focal motor/sensory deficits   LABORATORY DATA:  I have reviewed the data as  listed Lab Results  Component Value Date   WBC 7.6 10/25/2015   HGB 12.3 (L) 10/25/2015   HCT 37.4 (L) 10/25/2015   MCV 83.1 10/25/2015   PLT 301 10/25/2015   Lab Results  Component Value Date   NA 142 10/25/2015   K 3.3 (L) 10/25/2015   CL 105 10/18/2015   CO2 27 10/25/2015   ASSESSMENT AND PLAN:  Diffuse large B-cell lymphoma of extranodal site [facility]) Small intestine resection, proximal ileum 10/17/2015: Involvement by diffuse large B-cell lymphoma,. Biopsy of peritoneal implant: Invol by a DLBCL.positive for CD20, CD79a,CD10, bcl-6, and bcl-2. CD3 and CD5 highlight scattered admixed T-cells. CD34 and TdT neg  Pathology counseling: I discussed the diagnosis of lymphoma in great detail including the different subtypes and the significance of diffuse large B-cell lymphoma. I discussed briefly the staging process as well as the prognosis information based upon international prognostic index.  Recommendation: 1. Blood work today to evaluate for hepatitis B and C serology, HIV, CBC, CMP, LDH to determine the IPI score 2. PET CT scan for staging 3. Bone marrow biopsy for staging 4. Testicular ultrasound because of testicular pain to evaluate  for any evidence of testicular lymphoma 5. I recommend systemic chemotherapy with our CHOP 6 cycles every 3 weeks 6. Echocardiogram in anticipation of Adriamycin chemotherapy 7. I will request port placement from surgery 8. Chemotherapy class  Abdominal incision appearing read with serous discharge: Patient has an appointment today to see his surgeon.  Return to clinic in 2 weeks to discuss the test results.  Chemotherapy counseling: I discussed the pros and cons of chemotherapy including the risk of Rituxan induced first infusion reactions as well as cytopenias, risk of infection, Adriamycin-related cardiotoxicity, vincristine related to peripheral neuropathy, Cytoxan related long-term bone marrow damage were all discussed. Patient is agreeable  to the treatment.  Plan to start chemotherapy in about 3 weeks.    All questions were answered. The patient knows to call the clinic with any problems, questions or concerns.    Rulon Eisenmenger, MD 10/25/15

## 2015-10-25 NOTE — Assessment & Plan Note (Signed)
Small intestine resection, proximal ileum 10/17/2015: Involvement by diffuse large B-cell lymphoma,. Biopsy of peritoneal implant: Invol by a DLBCL.positive for CD20, CD79a,CD10, bcl-6, and bcl-2. CD3 and CD5 highlight scattered admixed T-cells. CD34 and TdT neg  Pathology counseling: I discussed the diagnosis of lymphoma in great detail including the different subtypes and the significance of diffuse large B-cell lymphoma. I discussed briefly the staging process as well as the prognosis information based upon international prognostic index.  Recommendation: 1. Blood work today to evaluate for hepatitis B and C serology, HIV, CBC, CMP, LDH to determine the IPI score 2. PET CT scan for staging 3. Bone marrow biopsy for staging 4. Testicular ultrasound because of testicular pain to evaluate for any evidence of testicular lymphoma 5. I recommend systemic chemotherapy with our CHOP 6 cycles every 3 weeks 6. Echocardiogram in anticipation of Adriamycin chemotherapy 7. I will request port placement from surgery 8. Chemotherapy class  Abdominal incision appearing read with serous discharge: Patient has an appointment today to see his surgeon.  Return to clinic in 2 weeks to discuss the test results. Plan to start chemotherapy in about 3 weeks.

## 2015-10-25 NOTE — Telephone Encounter (Signed)
appt made and avs printed °

## 2015-10-26 LAB — HEPATITIS B SURFACE ANTIGEN: HBsAg Screen: NEGATIVE

## 2015-10-26 LAB — IGG, IGA, IGM
IGA/IMMUNOGLOBULIN A, SERUM: 157 mg/dL (ref 90–386)
IGM (IMMUNOGLOBIN M), SRM: 98 mg/dL (ref 20–172)

## 2015-10-26 LAB — HEPATITIS C ANTIBODY

## 2015-10-26 LAB — HIV ANTIBODY (ROUTINE TESTING W REFLEX): HIV SCREEN 4TH GENERATION: NONREACTIVE

## 2015-10-30 ENCOUNTER — Other Ambulatory Visit: Payer: Self-pay | Admitting: Radiology

## 2015-10-30 ENCOUNTER — Other Ambulatory Visit: Payer: Self-pay | Admitting: General Surgery

## 2015-10-31 ENCOUNTER — Other Ambulatory Visit: Payer: Self-pay

## 2015-10-31 ENCOUNTER — Ambulatory Visit (HOSPITAL_COMMUNITY)
Admission: RE | Admit: 2015-10-31 | Discharge: 2015-10-31 | Disposition: A | Discharge status: Home / Self Care | Payer: BLUE CROSS/BLUE SHIELD | Source: Ambulatory Visit | Attending: Hematology and Oncology | Admitting: Hematology and Oncology

## 2015-10-31 ENCOUNTER — Encounter (HOSPITAL_COMMUNITY): Payer: Self-pay

## 2015-10-31 DIAGNOSIS — C8339 Diffuse large B-cell lymphoma, extranodal and solid organ sites: Secondary | ICD-10-CM | POA: Diagnosis not present

## 2015-10-31 DIAGNOSIS — M545 Low back pain: Secondary | ICD-10-CM | POA: Diagnosis not present

## 2015-10-31 DIAGNOSIS — D7589 Other specified diseases of blood and blood-forming organs: Secondary | ICD-10-CM | POA: Insufficient documentation

## 2015-10-31 DIAGNOSIS — C8333 Diffuse large B-cell lymphoma, intra-abdominal lymph nodes: Secondary | ICD-10-CM | POA: Diagnosis not present

## 2015-10-31 DIAGNOSIS — K769 Liver disease, unspecified: Secondary | ICD-10-CM | POA: Diagnosis not present

## 2015-10-31 DIAGNOSIS — D649 Anemia, unspecified: Secondary | ICD-10-CM | POA: Diagnosis not present

## 2015-10-31 DIAGNOSIS — C8510 Unspecified B-cell lymphoma, unspecified site: Secondary | ICD-10-CM | POA: Diagnosis not present

## 2015-10-31 DIAGNOSIS — D72822 Plasmacytosis: Secondary | ICD-10-CM | POA: Diagnosis not present

## 2015-10-31 DIAGNOSIS — D469 Myelodysplastic syndrome, unspecified: Secondary | ICD-10-CM | POA: Diagnosis not present

## 2015-10-31 DIAGNOSIS — K219 Gastro-esophageal reflux disease without esophagitis: Secondary | ICD-10-CM | POA: Insufficient documentation

## 2015-10-31 DIAGNOSIS — G8929 Other chronic pain: Secondary | ICD-10-CM | POA: Insufficient documentation

## 2015-10-31 DIAGNOSIS — Z87442 Personal history of urinary calculi: Secondary | ICD-10-CM | POA: Diagnosis not present

## 2015-10-31 DIAGNOSIS — F329 Major depressive disorder, single episode, unspecified: Secondary | ICD-10-CM | POA: Diagnosis not present

## 2015-10-31 LAB — PROTIME-INR
INR: 1.03
Prothrombin Time: 13.6 seconds (ref 11.4–15.2)

## 2015-10-31 LAB — CBC WITH DIFFERENTIAL/PLATELET
BASOS ABS: 0 10*3/uL (ref 0.0–0.1)
BASOS PCT: 1 %
Eosinophils Absolute: 0.3 10*3/uL (ref 0.0–0.7)
Eosinophils Relative: 6 %
HEMATOCRIT: 36.4 % — AB (ref 39.0–52.0)
HEMOGLOBIN: 12 g/dL — AB (ref 13.0–17.0)
LYMPHS PCT: 28 %
Lymphs Abs: 1.4 10*3/uL (ref 0.7–4.0)
MCH: 27.3 pg (ref 26.0–34.0)
MCHC: 33 g/dL (ref 30.0–36.0)
MCV: 82.9 fL (ref 78.0–100.0)
MONO ABS: 0.4 10*3/uL (ref 0.1–1.0)
Monocytes Relative: 9 %
NEUTROS ABS: 2.8 10*3/uL (ref 1.7–7.7)
NEUTROS PCT: 56 %
Platelets: 404 10*3/uL — ABNORMAL HIGH (ref 150–400)
RBC: 4.39 MIL/uL (ref 4.22–5.81)
RDW: 16.5 % — ABNORMAL HIGH (ref 11.5–15.5)
WBC: 4.9 10*3/uL (ref 4.0–10.5)

## 2015-10-31 LAB — BONE MARROW EXAM

## 2015-10-31 LAB — APTT: APTT: 29 s (ref 24–36)

## 2015-10-31 MED ORDER — SODIUM CHLORIDE 0.9 % IV SOLN
INTRAVENOUS | Status: DC
  Administered 2015-10-31: 08:00:00 via INTRAVENOUS

## 2015-10-31 MED ORDER — FENTANYL CITRATE (PF) 100 MCG/2ML IJ SOLN
INTRAMUSCULAR | Status: AC
  Filled 2015-10-31: qty 4

## 2015-10-31 MED ORDER — MIDAZOLAM HCL 2 MG/2ML IJ SOLN
INTRAMUSCULAR | Status: AC
  Filled 2015-10-31: qty 6

## 2015-10-31 MED ORDER — FENTANYL CITRATE (PF) 100 MCG/2ML IJ SOLN
INTRAMUSCULAR | Status: AC | PRN
  Administered 2015-10-31: 50 ug via INTRAVENOUS
  Administered 2015-10-31 (×2): 25 ug via INTRAVENOUS

## 2015-10-31 MED ORDER — MIDAZOLAM HCL 2 MG/2ML IJ SOLN
INTRAMUSCULAR | Status: AC | PRN
  Administered 2015-10-31: 2 mg via INTRAVENOUS
  Administered 2015-10-31: 1 mg via INTRAVENOUS
  Administered 2015-10-31: 2 mg via INTRAVENOUS
  Administered 2015-10-31: 1 mg via INTRAVENOUS

## 2015-10-31 NOTE — Progress Notes (Signed)
Received VM from pt's wife inquiring about upcoming appointments.  Specifically, PET and [place] appointments.  Chart reviewed and appointments not yet scheduled.  Contacted central scheduling to schedule and was informed an additional order was needed to schedule pt.  Order entered as directed and scheduler stated she would contact pt to schedule.  Called pt's wife back to discuss and she verbalized understanding.  Prior to hanging up with pt's wife, she stated central scheduling was calling.  Pt without further questions or concerns at time of call.

## 2015-10-31 NOTE — Procedures (Signed)
S/p RT ILIAC BM ASP AND CORE BX  No comp Stable Path pending Full report in PACS

## 2015-10-31 NOTE — Discharge Instructions (Signed)
Bone Marrow Aspiration and Bone Marrow Biopsy °Bone marrow aspiration and bone marrow biopsy are procedures that are done to diagnose blood disorders. You may also have one of these procedures to help diagnose infections or some types of cancer. °Bone marrow is the soft tissue that is inside your bones. Blood cells are produced in bone marrow. For bone marrow aspiration, a sample of tissue in liquid form is removed from inside your bone. For a bone marrow biopsy, a small core of bone marrow tissue is removed. Then these samples are examined under a microscope or tested in a lab. °You may need these procedures if you have an abnormal complete blood count (CBC). The aspiration or biopsy sample is usually taken from the top of your hip bone. Sometimes, an aspiration sample is taken from your chest bone (sternum). °LET YOUR HEALTH CARE PROVIDER KNOW ABOUT: °· Any allergies you have. °· All medicines you are taking, including vitamins, herbs, eye drops, creams, and over-the-counter medicines. °· Previous problems you or members of your family have had with the use of anesthetics. °· Any blood disorders you have. °· Previous surgeries you have had. °· Any medical conditions you may have. °· Whether you are pregnant or you think that you may be pregnant. °RISKS AND COMPLICATIONS °Generally, this is a safe procedure. However, problems may occur, including: °· Infection. °· Bleeding. °BEFORE THE PROCEDURE °· Ask your health care provider about: °¨ Changing or stopping your regular medicines. This is especially important if you are taking diabetes medicines or blood thinners. °¨ Taking medicines such as aspirin and ibuprofen. These medicines can thin your blood. Do not take these medicines before your procedure if your health care provider instructs you not to. °· Plan to have someone take you home after the procedure. °· If you go home right after the procedure, plan to have someone with you for 24 hours. °PROCEDURE  °· An  IV tube may be inserted into one of your veins. °· The injection site will be cleaned with a germ-killing solution (antiseptic). °· You will be given one or more of the following: °¨ A medicine that helps you relax (sedative). °¨ A medicine that numbs the area (local anesthetic). °· The bone marrow sample will be removed as follows: °¨ For an aspiration, a hollow needle will be inserted through your skin and into your bone. Bone marrow fluid will be drawn up into a syringe. °¨ For a biopsy, your health care provider will use a hollow needle to remove a core of tissue from your bone marrow. °· The needle will be removed. °· A bandage (dressing) will be placed over the insertion site and taped in place. °The procedure may vary among health care providers and hospitals. °AFTER THE PROCEDURE °· Your blood pressure, heart rate, breathing rate, and blood oxygen level will be monitored often until the medicines you were given have worn off. °· Return to your normal activities as directed by your health care provider. °  °This information is not intended to replace advice given to you by your health care provider. Make sure you discuss any questions you have with your health care provider. °  °Document Released: 02/29/2004 Document Revised: 07/12/2014 Document Reviewed: 02/16/2014 °Elsevier Interactive Patient Education ©2016 [employer] ° °Bone Marrow Aspiration and Bone Marrow Biopsy, Care After °Refer to this sheet in the next few weeks. These instructions provide you with information about caring for yourself after your procedure. Your health care provider may also give   you more specific instructions. Your treatment has been planned according to current medical practices, but problems sometimes occur. Call your health care provider if you have any problems or questions after your procedure. °WHAT TO EXPECT AFTER THE PROCEDURE °After your procedure, it is common to have: °· Soreness or tenderness around the puncture  site. °· Bruising. °HOME CARE INSTRUCTIONS °· Take medicines only as directed by your health care provider. °· Follow your health care provider's instructions about: °¨ Puncture site care. °¨ Bandage (dressing) changes and removal. °· Bathe and shower as directed by your health care provider. °· Check your puncture site every day for signs of infection. Watch for: °¨ Redness, swelling, or pain. °¨ Fluid, blood, or pus. °· Return to your normal activities as directed by your health care provider. °· Keep all follow-up visits as directed by your health care provider. This is important. °SEEK MEDICAL CARE IF: °· You have a fever. °· You have uncontrollable bleeding. °· You have redness, swelling, or pain at the site of your puncture. °· You have fluid, blood, or pus coming from your puncture site. °  °This information is not intended to replace advice given to you by your health care provider. Make sure you discuss any questions you have with your health care provider. °  °Document Released: 09/14/2004 Document Revised: 07/12/2014 Document Reviewed: 02/16/2014 °Elsevier Interactive Patient Education ©2016 [employer] ° °Moderate Conscious Sedation, Adult, Care After °Refer to this sheet in the next few weeks. These instructions provide you with information on caring for yourself after your procedure. Your health care provider may also give you more specific instructions. Your treatment has been planned according to current medical practices, but problems sometimes occur. Call your health care provider if you have any problems or questions after your procedure. °WHAT TO EXPECT AFTER THE PROCEDURE  °After your procedure: °· You may feel sleepy, clumsy, and have poor balance for several hours. °· Vomiting may occur if you eat too soon after the procedure. °HOME CARE INSTRUCTIONS °· Do not participate in any activities where you could become injured for at least 24 hours. Do not: °¨ Drive. °¨ Swim. °¨ Ride a  bicycle. °¨ Operate heavy machinery. °¨ Cook. °¨ Use power tools. °¨ Climb ladders. °¨ Work from a high place. °· Do not make important decisions or sign legal documents until you are improved. °· If you vomit, drink water, juice, or soup when you can drink without vomiting. Make sure you have little or no nausea before eating solid foods. °· Only take over-the-counter or prescription medicines for pain, discomfort, or fever as directed by your health care provider. °· Make sure you and your family fully understand everything about the medicines given to you, including what side effects may occur. °· You should not drink alcohol, take sleeping pills, or take medicines that cause drowsiness for at least 24 hours. °· If you smoke, do not smoke without supervision. °· If you are feeling better, you may resume normal activities 24 hours after you were sedated. °· Keep all appointments with your health care provider. °SEEK MEDICAL CARE IF: °· Your skin is pale or bluish in color. °· You continue to feel nauseous or vomit. °· Your pain is getting worse and is not helped by medicine. °· You have bleeding or swelling. °· You are still sleepy or feeling clumsy after 24 hours. °SEEK IMMEDIATE MEDICAL CARE IF: °· You develop a rash. °· You have difficulty breathing. °· You develop any   type of allergic problem. °· You have a fever. °MAKE SURE YOU: °· Understand these instructions. °· Will watch your condition. °· Will get help right away if you are not doing well or get worse. °  °This information is not intended to replace advice given to you by your health care provider. Make sure you discuss any questions you have with your health care provider. °  °Document Released: 12/16/2012 Document Revised: 03/18/2014 Document Reviewed: 12/16/2012 °Elsevier Interactive Patient Education ©2016 [employer] ° °

## 2015-10-31 NOTE — Consult Note (Signed)
Chief Complaint: Patient was seen in consultation today for CT guided bone marrow biopsy  Referring Physician(s): [name],Vinay  Supervising Physician: Daryll Brod  Patient Status: Outpatient  History of Present Illness: Edward Steele is a 60 y.o. 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 He is status post  small bowel /peritoneal implant resection on 10/17/15. He presents today for staging CT-guided bone marrow biopsy for further evaluation.  Past Medical History:  Diagnosis Date  . Anemia    oral iron supplement used  . Chronic back pain    lower back  . Depression   . GERD (gastroesophageal reflux disease)   . History of kidney stones    x 4 episodes-no recent issues  . Recurrent sinus infections    past history-none recent " Inhalers were for this- no use im many yrs"    Past Surgical History:  Procedure Laterality Date  . ANTERIOR FUSION CERVICAL SPINE    . BACK SURGERY     x2 lower back with fusion  . BOWEL RESECTION N/A 10/17/2015   Procedure: EXPLORATORY LAPAROTOMY, SMALL BOWEL RESECTION, WITH ANASTAMOSIS;  Surgeon: Ralene Ok, MD;  Location: [facility];  Service: General;  Laterality: N/A;  . CATARACT EXTRACTION, BILATERAL Bilateral   . EYE SURGERY Left    corneal tranplant  . LAPAROSCOPY N/A 10/17/2015   Procedure: LAPAROSCOPY DIAGNOSTIC;  Surgeon: Ralene Ok, MD;  Location: [facility];  Service: General;  Laterality: N/A;    Allergies: Penicillin g  Medications: Prior to Admission medications   Medication Sig Start Date End Date Taking? Authorizing Provider  beclomethasone (QVAR) 40 MCG/ACT inhaler Inhale 1 puff into the lungs daily.   Yes Historical Provider, MD  clonazePAM (KLONOPIN) 1 MG tablet Take 1 mg by mouth 2 (two) times daily. 09/18/15  Yes Historical Provider, MD  DEXILANT 60 MG capsule Take 60 mg by mouth daily. 07/21/15  Yes Historical Provider, MD  FLUoxetine (PROZAC) 20 MG tablet Take 40 mg by mouth 2  (two) times daily. 07/28/15  Yes Historical Provider, MD  gabapentin (NEURONTIN) 300 MG capsule Take 300 mg by mouth at bedtime as needed (nerve pain).  07/21/15  Yes Historical Provider, MD  oxyCODONE-acetaminophen (PERCOCET/ROXICET) 5-325 MG tablet Take 1-2 tablets by mouth every 4 (four) hours as needed for moderate pain. 10/22/15  Yes Jackolyn Confer, MD  rosuvastatin (CRESTOR) 5 MG tablet Take 5 mg by mouth daily. 08/28/15  Yes Historical Provider, MD  albuterol (PROVENTIL HFA;VENTOLIN HFA) 108 (90 Base) MCG/ACT inhaler Inhale 2 puffs into the lungs every 6 (six) hours as needed for wheezing or shortness of breath.     Historical Provider, MD     History reviewed. No pertinent family history.  Social History   Social History  . Marital status: Married    Spouse name: N/A  . Number of children: N/A  . Years of education: N/A   Social History Main Topics  . Smoking status: Never Smoker  . Smokeless tobacco: Never Used  . Alcohol use No  . Drug use: No  . Sexual activity: Yes   Other Topics Concern  . None   Social History Narrative  . None    .  Review of Systems patient denies fever, headache, dyspnea, cough, back pain, nausea, vomiting or abnormal bleeding. He does have some mild abdominal pain secondary to draining wound.  Vital Signs: BP 103/69   Pulse 67   Temp 98.1 F (36.7 C) (Oral)   Resp 18  SpO2 99%   Physical Exam awake, alert. Chest clear to auscultation bilaterally. Heart with regular rate and rhythm. Abdomen soft, positive bowel sounds, lower midline wound with small amount of serous drainage/ minimal erythema noted at base  Mallampati Score:     Imaging: Mr Abdomen W Wo Contrast  Result Date: 10/06/2015 CLINICAL DATA:  Followup right hepatic lobe liver lesion EXAM: MRI ABDOMEN WITHOUT AND WITH CONTRAST TECHNIQUE: Multiplanar multisequence MR imaging of the abdomen was performed both before and after the administration of intravenous contrast.  CONTRAST:  38m MULTIHANCE GADOBENATE DIMEGLUMINE 529 MG/ML IV SOLN COMPARISON:  CT scan 10/04/2015 FINDINGS: Lower chest: The lung bases are clear. No pleural or pericardial effusion. No pulmonary lesions. Hepatobiliary: There are few scattered tiny hepatic cysts. As demonstrated on the recent CT scan there is a 3.9 cm segment 5 liver lesion which demonstrates early arterial phase enhancement which is relatively uniform. This becomes in apparent on the later phase postcontrast images and is not visualized on any of the precontrast images. These findings are most consistent with benign focal nodular hyperplasia. Pancreas: Normal Spleen: Normal Adrenals/Urinary Tract: Normal Stomach/Bowel: The stomach, duodenum, visualized small bowel and visualized colon are grossly normal. Vascular/Lymphatic: The aorta and branch vessels are normal. The major venous structures are patent. No mesenteric or retroperitoneal mass or adenopathy. Other: No ascites or abdominal wall hernia. Musculoskeletal: No significant osseous findings. IMPRESSION: 1. 3.9 cm segment 5 liver lesion has MR imaging features most consistent with benign FNH. I would recommend a followup MR examination in 1 year but specifically using Eovist as the contrast material. 2. No other significant abdominal findings. Electronically Signed   By: PMarijo SanesM.D.   On: 10/06/2015 16:26  Ct Entero Abd/pelvis W Contast  Result Date: 10/04/2015 CLINICAL DATA:  Iron deficiency anemia, alternating constipation and diarrhea. EXAM: CT ABDOMEN AND PELVIS WITH CONTRAST (ENTEROGRAPHY) TECHNIQUE: Multidetector CT of the abdomen and pelvis during bolus administration of intravenous contrast. Negative oral contrast was given. CONTRAST:  1231mISOVUE-300 IOPAMIDOL (ISOVUE-300) INJECTION 61% COMPARISON:  None. FINDINGS: Lower chest: Lung bases show no acute findings. Heart size normal. No pericardial or pleural effusion. Tiny hiatal hernia. Hepatobiliary: Sub cm  low-attenuation lesion in the dome of the liver is too small to characterize. Uniformly hyper attenuating lesion in the right hepatic lobe measures 3.5 x 3.8 cm on portal venous phase imaging and is not imaged on nephrographic phase imaging. No biliary ductal dilatation. Pancreas: Negative. Spleen: Negative. Adrenals/Urinary Tract: Adrenal glands and kidneys are unremarkable. Ureters are decompressed. Bladder is grossly unremarkable. Stomach/Bowel: Tiny hiatal hernia. Stomach is decompressed. Stomach and majority of the small bowel are unremarkable. There is a thickened segment of small bowel in the central anatomic pelvis (series 3, image 138), measuring approximately 5.5 cm in length. Fluid and stool are seen in the colon. Vascular/Lymphatic: Vascular structures are unremarkable. No pathologically enlarged lymph nodes. Reproductive: Calcifications are seen in a mildly prominent prostate. Other: No free fluid. Small right inguinal hernia contains fat. Mesenteries and peritoneum are unremarkable. Musculoskeletal: No worrisome lytic or sclerotic lesions. IMPRESSION: 1. 5.5 cm segment of thickened distal small bowel which may be inflammatory in etiology. Malignancy cannot be excluded. 2. Uniformly hyper attenuating lesion in the right hepatic lobe may represent focal nodular hyperplasia. Additional evaluation with MR abdomen without and with contrast is recommended, as clinically indicated. Electronically Signed   By: MeLorin Picket.D.   On: 10/04/2015 17:51   Labs:  CBC:  Recent Labs  10/13/15  1430 10/18/15 0444 10/25/15 1147 10/31/15 0720  WBC 5.4 11.0* 7.6 4.9  HGB 12.0* 10.7* 12.3* 12.0*  HCT 35.8* 32.2* 37.4* 36.4*  PLT 251 178 301 404*    COAGS:  Recent Labs  10/31/15 0720  INR 1.03  APTT 29    BMP:  Recent Labs  10/18/15 0444 10/25/15 1147  NA 136 142  K 3.8 3.3*  CL 105  --   CO2 28 27  GLUCOSE 139* 97  BUN 13 12.3  CALCIUM 8.1* 9.5  CREATININE 0.85 0.9  GFRNONAA  >60  --   GFRAA >60  --     LIVER FUNCTION TESTS:  Recent Labs  10/25/15 1147  BILITOT 0.41  AST 52*  ALT 41  ALKPHOS 82  PROT 8.2  ALBUMIN 3.0*    TUMOR MARKERS: No results for input(s): AFPTM, CEA, CA199, CHROMGRNA in the last 8760 hours.  Assessment and Plan: 60 y.o. 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 He is status post  small bowel /peritoneal implant resection on 10/17/15. He presents today for staging CT-guided bone marrow biopsy for further evaluation.Risks and benefits discussed with the patient/wife including, but not limited to bleeding, infection, damage to adjacent structures or low yield requiring additional tests.All of the patient's questions were answered, patient is agreeable to proceed.Consent signed and in chart.    Thank you for this interesting consult.  I greatly enjoyed meeting Edward Steele and look forward to participating in their care.  A copy of this report was sent to the requesting provider on this date.  Electronically Signed: D. Rowe Robert 10/31/2015, 8:19 AM   I spent a total of 20 minutes    in face to face in clinical consultation, greater than 50% of which was counseling/coordinating care for CT guided bone marrow biopsy

## 2015-11-02 ENCOUNTER — Encounter (HOSPITAL_COMMUNITY)
Admission: RE | Admit: 2015-11-02 | Discharge: 2015-11-02 | Disposition: A | Discharge status: Home / Self Care | Payer: BLUE CROSS/BLUE SHIELD | Source: Ambulatory Visit | Attending: Hematology and Oncology | Admitting: Hematology and Oncology

## 2015-11-02 ENCOUNTER — Other Ambulatory Visit: Payer: Self-pay | Admitting: Oncology

## 2015-11-02 DIAGNOSIS — C8339 Diffuse large B-cell lymphoma, extranodal and solid organ sites: Secondary | ICD-10-CM

## 2015-11-02 DIAGNOSIS — N50819 Testicular pain, unspecified: Secondary | ICD-10-CM | POA: Diagnosis not present

## 2015-11-02 DIAGNOSIS — C8330 Diffuse large B-cell lymphoma, unspecified site: Secondary | ICD-10-CM | POA: Diagnosis not present

## 2015-11-02 LAB — GLUCOSE, CAPILLARY: Glucose-Capillary: 92 mg/dL (ref 65–99)

## 2015-11-02 MED ORDER — FLUDEOXYGLUCOSE F - 18 (FDG) INJECTION: 10.9000 | Freq: Once | INTRAVENOUS | Status: DC | PRN

## 2015-11-06 ENCOUNTER — Other Ambulatory Visit: Payer: Self-pay | Admitting: Urology

## 2015-11-06 ENCOUNTER — Ambulatory Visit (HOSPITAL_COMMUNITY)
Admission: RE | Admit: 2015-11-06 | Discharge: 2015-11-06 | Disposition: A | Discharge status: Home / Self Care | Payer: BLUE CROSS/BLUE SHIELD | Source: Ambulatory Visit | Attending: Urology | Admitting: Urology

## 2015-11-06 DIAGNOSIS — S3729XA Other injury of bladder, initial encounter: Secondary | ICD-10-CM | POA: Diagnosis not present

## 2015-11-06 DIAGNOSIS — X58XXXA Exposure to other specified factors, initial encounter: Secondary | ICD-10-CM | POA: Insufficient documentation

## 2015-11-06 DIAGNOSIS — N3289 Other specified disorders of bladder: Secondary | ICD-10-CM | POA: Insufficient documentation

## 2015-11-06 DIAGNOSIS — S3720XA Unspecified injury of bladder, initial encounter: Secondary | ICD-10-CM | POA: Diagnosis not present

## 2015-11-06 MED ORDER — IOTHALAMATE MEGLUMINE 17.2 % UR SOLN
250.0000 mL | Freq: Once | URETHRAL | Status: AC | PRN
  Administered 2015-11-06: 300 mL via INTRAVESICAL

## 2015-11-07 ENCOUNTER — Encounter: Payer: Self-pay | Admitting: *Deleted

## 2015-11-07 ENCOUNTER — Encounter (HOSPITAL_COMMUNITY)
Admission: RE | Admit: 2015-11-07 | Discharge: 2015-11-07 | Disposition: A | Discharge status: Home / Self Care | Payer: BLUE CROSS/BLUE SHIELD | Source: Ambulatory Visit | Attending: General Surgery | Admitting: General Surgery

## 2015-11-07 ENCOUNTER — Encounter (HOSPITAL_COMMUNITY): Payer: Self-pay

## 2015-11-07 ENCOUNTER — Other Ambulatory Visit: Payer: BLUE CROSS/BLUE SHIELD

## 2015-11-07 ENCOUNTER — Ambulatory Visit (HOSPITAL_BASED_OUTPATIENT_CLINIC_OR_DEPARTMENT_OTHER): Payer: BLUE CROSS/BLUE SHIELD | Admitting: Hematology and Oncology

## 2015-11-07 ENCOUNTER — Encounter: Payer: Self-pay | Admitting: Hematology and Oncology

## 2015-11-07 VITALS — BP 120/67 | HR 74 | Temp 97.5°F | Resp 18 | Ht 70.0 in | Wt 184.3 lb

## 2015-11-07 DIAGNOSIS — K219 Gastro-esophageal reflux disease without esophagitis: Secondary | ICD-10-CM | POA: Diagnosis not present

## 2015-11-07 DIAGNOSIS — C8590 Non-Hodgkin lymphoma, unspecified, unspecified site: Secondary | ICD-10-CM | POA: Diagnosis present

## 2015-11-07 DIAGNOSIS — F419 Anxiety disorder, unspecified: Secondary | ICD-10-CM | POA: Diagnosis not present

## 2015-11-07 DIAGNOSIS — C8339 Diffuse large B-cell lymphoma, extranodal and solid organ sites: Secondary | ICD-10-CM

## 2015-11-07 DIAGNOSIS — F329 Major depressive disorder, single episode, unspecified: Secondary | ICD-10-CM | POA: Diagnosis not present

## 2015-11-07 DIAGNOSIS — C8593 Non-Hodgkin lymphoma, unspecified, intra-abdominal lymph nodes: Secondary | ICD-10-CM | POA: Diagnosis not present

## 2015-11-07 HISTORY — DX: Constipation, unspecified: K59.00

## 2015-11-07 HISTORY — DX: Pneumonia, unspecified organism: J18.9

## 2015-11-07 HISTORY — DX: Concussion with loss of consciousness of unspecified duration, initial encounter: S06.0X9A

## 2015-11-07 HISTORY — DX: Malignant (primary) neoplasm, unspecified: C80.1

## 2015-11-07 HISTORY — DX: Anxiety disorder, unspecified: F41.9

## 2015-11-07 HISTORY — DX: Concussion with loss of consciousness status unknown, initial encounter: S06.0XAA

## 2015-11-07 MED ORDER — ALLOPURINOL 300 MG PO TABS: 300.0000 mg | ORAL_TABLET | Freq: Every day | ORAL | 3 refills | Status: DC

## 2015-11-07 MED ORDER — PROCHLORPERAZINE MALEATE 10 MG PO TABS: 10.0000 mg | ORAL_TABLET | Freq: Four times a day (QID) | ORAL | 6 refills | Status: DC | PRN

## 2015-11-07 MED ORDER — PREDNISONE 20 MG PO TABS: 60.0000 mg | ORAL_TABLET | Freq: Every day | ORAL | 0 refills | Status: DC

## 2015-11-07 MED ORDER — LIDOCAINE-PRILOCAINE 2.5-2.5 % EX CREA: TOPICAL_CREAM | CUTANEOUS | 3 refills | Status: DC

## 2015-11-07 MED ORDER — LORAZEPAM 0.5 MG PO TABS: 0.5000 mg | ORAL_TABLET | Freq: Four times a day (QID) | ORAL | 0 refills | Status: DC | PRN

## 2015-11-07 MED ORDER — ONDANSETRON HCL 8 MG PO TABS: 8.0000 mg | ORAL_TABLET | Freq: Three times a day (TID) | ORAL | 1 refills | Status: DC | PRN

## 2015-11-07 NOTE — Pre-Procedure Instructions (Signed)
    Edward Steele  11/07/2015   Your procedure is scheduled on Wednesday, August 30.  Report to [facility] Admitting at 8:00 A.M.               Your surgery or procedure is scheduled for 10:00 AM   Call this number if you have problems the morning of surgery:[phone]          Remember:  Do not eat food or drink liquids after midnight .  Take these medicines the morning of surgery with A SIP OF WATER: clonazePAM (KLONOPIN), DEXILANT,  FLUoxetine (PROZAC), rosuvastatin (CRESTOR).                   May use Inhaler if needed and  Please bring it to the hospital with you.  May  take oxyCODONE-acetaminophen (PERCOCET/ROXICET) if needed and can take on an empty stomach.   Do not wear jewelry, make-up or nail polish.  Do not wear lotions, powders, or perfumes, or deoderant.  Do not shave 48 hours prior to surgery.    Do not bring valuables to the hospital.  [facility] is not responsible for any belongings or valuables.  Contacts, dentures or bridgework may not be worn into surgery.  Leave your suitcase in the car.  After surgery it may be brought to your room.  For patients admitted to the hospital, discharge time will be determined by your treatment team.  Patients discharged the day of surgery will not be allowed to drive home.   Name and phone number of your driver: -  Special instructions:  Review  [place] - Preparing For Surgery.  Please read over the following fact sheets that you were given. Review  [place] - Preparing For Surgery.

## 2015-11-07 NOTE — Progress Notes (Signed)
Patient Care Team: Margo Common, MD as PCP - General (Internal Medicine)  SUMMARY OF ONCOLOGIC HISTORY:   Diffuse large B-cell lymphoma of extranodal site ([place])   10/17/2015 Initial Diagnosis    Small intestine resection, proximal ileum: Involvement by diffuse large B-cell lymphoma,. Biopsy of peritoneal implant: Invol by a DLBCL.positive for CD20, CD79a,CD10, bcl-6, and bcl-2. CD3 and CD5 highlight scattered admixed T-cells. CD34 and TdT neg      10/31/2015 Pathology Results    Bone marrow biopsy: Hypercellular bone marrow for age 75-60% with trilineage hematopoiesis, plasma cells 9% with Kappa Light chain excess      11/02/2015 PET scan    Nodular thickening within the mesenteries of small bowel 17 mm concerning for peritoneal implant of high-grade lymphoma, activity along the abdominal wound with inflammation suspicion for infection/abscess/fistula of bladder      11/02/2015 Imaging    Ultrasound scrotum: Normal testes, no epididymitis, no varicocele or hydrocele       CHIEF COMPLIANT: Follow-up to discuss the scans  INTERVAL HISTORY: Edward Steele is a 60 year old with above-mentioned history of diffuse large B-cell lymphoma of the small understands who is here for a follow-up to review the scans and tested via performed. He continues to have leakage around the ventral surgical 1. He is going to be taken to rule out tomorrow for port placement at that time Dr. Rosendo Gros plans to perform a thorough evaluation of the abdominal side and maybe obtain biopsies. He reports no fevers or chills. The PET/CT scan showed evidence of abscess/infection. He had a cisternogram which did not show any evidence of urinary fistula.  REVIEW OF SYSTEMS:   Constitutional: Denies fevers, chills or abnormal weight loss Eyes: Denies blurriness of vision Ears, nose, mouth, throat, and face: Denies mucositis or sore throat Respiratory: Denies cough, dyspnea or wheezes Cardiovascular: Denies palpitation, chest  discomfort Gastrointestinal:  Denies nausea, heartburn or change in bowel habits Skin: Denies abnormal skin rashes Lymphatics: Denies new lymphadenopathy or easy bruising Neurological:Denies numbness, tingling or new weaknesses Behavioral/Psych: Mood is stable, no new changes  Extremities: No lower extremity edema  All other systems were reviewed with the patient and are negative.  I have reviewed the past medical history, past surgical history, social history and family history with the patient and they are unchanged from previous note.  ALLERGIES:  is allergic to penicillin g.  MEDICATIONS:  Current Outpatient Prescriptions  Medication Sig Dispense Refill  . albuterol (PROVENTIL HFA;VENTOLIN HFA) 108 (90 Base) MCG/ACT inhaler Inhale 2 puffs into the lungs every 6 (six) hours as needed for wheezing or shortness of breath.     . allopurinol (ZYLOPRIM) 300 MG tablet Take 1 tablet (300 mg total) by mouth daily. 30 tablet 3  . beclomethasone (QVAR) 40 MCG/ACT inhaler Inhale 1 puff into the lungs daily.    . clonazePAM (KLONOPIN) 1 MG tablet Take 1 mg by mouth 2 (two) times daily.    Marland Kitchen DEXILANT 60 MG capsule Take 60 mg by mouth daily.    . ferrous sulfate 325 (65 FE) MG tablet Take 325 mg by mouth daily as needed (supplement).    Marland Kitchen FLUoxetine (PROZAC) 20 MG tablet Take 40 mg by mouth 2 (two) times daily.    Marland Kitchen gabapentin (NEURONTIN) 300 MG capsule Take 300 mg by mouth at bedtime as needed (nerve pain).     Marland Kitchen lidocaine-prilocaine (EMLA) cream Apply to affected area once 30 g 3  . LORazepam (ATIVAN) 0.5 MG tablet Take 1 tablet (0.5  mg total) by mouth every 6 (six) hours as needed (Nausea or vomiting). 30 tablet 0  . ondansetron (ZOFRAN) 8 MG tablet Take 1 tablet (8 mg total) by mouth every 8 (eight) hours as needed for refractory nausea / vomiting. 30 tablet 1  . oxyCODONE-acetaminophen (PERCOCET/ROXICET) 5-325 MG tablet Take 1-2 tablets by mouth every 4 (four) hours as needed for moderate pain.  (Patient not taking: Reported on 11/03/2015) 30 tablet 0  . predniSONE (DELTASONE) 20 MG tablet Take 3 tablets (60 mg total) by mouth daily. Take on days 1-5 of chemotherapy. 90 tablet 0  . prochlorperazine (COMPAZINE) 10 MG tablet Take 1 tablet (10 mg total) by mouth every 6 (six) hours as needed (Nausea or vomiting). 30 tablet 6  . rosuvastatin (CRESTOR) 5 MG tablet Take 5 mg by mouth daily.     No current facility-administered medications for this visit.    Facility-Administered Medications Ordered in Other Visits  Medication Dose Route Frequency Provider Last Rate Last Dose  . fludeoxyglucose F - 18 (FDG) injection 70.3 millicurie  50.0 millicurie Intravenous Once PRN David A Martinique, MD        PHYSICAL EXAMINATION: ECOG PERFORMANCE STATUS: 0 - Asymptomatic  Vitals:   11/07/15 1009  BP: 120/67  Pulse: 74  Resp: 18  Temp: 97.5 F (36.4 C)   Filed Weights   11/07/15 1009  Weight: 184 lb 4.8 oz (83.6 kg)    GENERAL:alert, no distress and comfortable SKIN: skin color, texture, turgor are normal, no rashes or significant lesions EYES: normal, Conjunctiva are pink and non-injected, sclera clear OROPHARYNX:no exudate, no erythema and lips, buccal mucosa, and tongue normal  NECK: supple, thyroid normal size, non-tender, without nodularity LYMPH:  no palpable lymphadenopathy in the cervical, axillary or inguinal LUNGS: clear to auscultation and percussion with normal breathing effort HEART: regular rate & rhythm and no murmurs and no lower extremity edema ABDOMEN:abdomen soft, non-tender and normal bowel sounds MUSCULOSKELETAL:no cyanosis of digits and no clubbing  NEURO: alert & oriented x 3 with fluent speech, no focal motor/sensory deficits EXTREMITIES: No lower extremity edema  LABORATORY DATA:  I have reviewed the data as listed   Chemistry      Component Value Date/Time   NA 142 10/25/2015 1147   K 3.3 (L) 10/25/2015 1147   CL 105 10/18/2015 0444   CO2 27 10/25/2015  1147   BUN 12.3 10/25/2015 1147   CREATININE 0.9 10/25/2015 1147      Component Value Date/Time   CALCIUM 9.5 10/25/2015 1147   ALKPHOS 82 10/25/2015 1147   AST 52 (H) 10/25/2015 1147   ALT 41 10/25/2015 1147   BILITOT 0.41 10/25/2015 1147       Lab Results  Component Value Date   WBC 4.9 10/31/2015   HGB 12.0 (L) 10/31/2015   HCT 36.4 (L) 10/31/2015   MCV 82.9 10/31/2015   PLT 404 (H) 10/31/2015   NEUTROABS 2.8 10/31/2015     ASSESSMENT & PLAN:  Diffuse large B-cell lymphoma of extranodal site [facility]) Small intestine resection, proximal ileum 10/17/2015: Involvement by diffuse large B-cell lymphoma,. Biopsy of peritoneal implant: Invol by a DLBCL.positive for CD20, CD79a,CD10, bcl-6, and bcl-2. CD3 and CD5 highlight scattered admixed T-cells. CD34 and TdT neg  PET/CT 11/02/2015: Nodular thickening within the mesenteries of small bowel 17 mm concerning for peritoneal implant of high-grade lymphoma, activity along the abdominal wound with inflammation suspicion for infection/abscess/fistula of bladder Testicular ultrasound: Normal Bone marrow biopsy 10/29/2015: Hypercellular bone marrow for age  50-60% with trilineage hematopoiesis, plasma cells 9% with Kappa Light chain excess  Lab review: LDH 184; hepatitis B, C, HIV testing negative IgG: 1703 Iron studies: 10% saturation, hemoglobin 12.3  Prognosis: Revised IPI score: 1 (4 year disease free survival rate: 80%, estimated overall survival 79%) If I uses age of less than or equal to 60, revised IPI score 62, 18-year-old DFS 94%, estimated overall survival 94%  Recommendation: R-CHOP chemotherapy 6 cycles  Abdominal incision infection: Patient has seen urology and Dr. Rosendo Gros. Ciystogram did not reveal a fistula. We are awaiting healing from the abdominal incision before starting chemotherapy.   Orders Placed This Encounter  Procedures  . CBC with Differential    Standing Status:   Standing    Number of Occurrences:   20      Standing Expiration Date:   11/07/2016  . Comprehensive metabolic panel    Standing Status:   Standing    Number of Occurrences:   20    Standing Expiration Date:   11/07/2016  . PHYSICIAN COMMUNICATION ORDER    A baseline Echo/ M[place] should be obtained prior to initiation of Anthracycline Chemotherapy  . PHYSICIAN COMMUNICATION ORDER    Hepatitis B Virus screening with HBsAg and anti-HBc recommended prior to treatment with rituximab (Rituxan), ofatumumab (Arzerra) or obinutuzumab Dyann Kief).   The patient has a good understanding of the overall plan. he agrees with it. he will call with any problems that may develop before the next visit here.   Rulon Eisenmenger, MD 11/07/15

## 2015-11-07 NOTE — Assessment & Plan Note (Signed)
Small intestine resection, proximal ileum 10/17/2015: Involvement by diffuse large B-cell lymphoma,. Biopsy of peritoneal implant: Invol by a DLBCL.positive for CD20, CD79a,CD10, bcl-6, and bcl-2. CD3 and CD5 highlight scattered admixed T-cells. CD34 and TdT neg  PET/CT 11/02/2015: Nodular thickening within the mesenteries of small bowel 17 mm concerning for peritoneal implant of high-grade lymphoma, activity along the abdominal wound with inflammation suspicion for infection/abscess/fistula of bladder Testicular ultrasound: Normal Bone marrow biopsy 10/29/2015: Hypercellular bone marrow for age 34-60% with trilineage hematopoiesis, plasma cells 9% with Kappa Light chain excess  Lab review: LDH 184; hepatitis B, C, HIV testing negative IgG: 1703 Iron studies: 10% saturation, hemoglobin 12.3  Prognosis: Revised IPI score: 1 (4 year disease free survival rate: 80%, estimated overall survival 79%) If I uses age of less than or equal to 60, revised IPI score 56, 60-year-old DFS 94%, estimated overall survival 94%  Recommendation: R-CHOP chemotherapy 6 cycles  Abdominal incision infection: Patient has seen urology and Dr. Rosendo Gros. Ciystogram did not reveal a fistula. We are awaiting healing from the abdominal incision before starting chemotherapy.

## 2015-11-08 ENCOUNTER — Ambulatory Visit (HOSPITAL_COMMUNITY): Payer: BLUE CROSS/BLUE SHIELD | Admitting: Certified Registered Nurse Anesthetist

## 2015-11-08 ENCOUNTER — Encounter (HOSPITAL_COMMUNITY): Payer: Self-pay | Admitting: Certified Registered Nurse Anesthetist

## 2015-11-08 ENCOUNTER — Ambulatory Visit (HOSPITAL_COMMUNITY)
Admission: RE | Admit: 2015-11-08 | Discharge: 2015-11-08 | Disposition: A | Discharge status: Home / Self Care | Payer: BLUE CROSS/BLUE SHIELD | Source: Ambulatory Visit | Attending: General Surgery | Admitting: General Surgery

## 2015-11-08 ENCOUNTER — Encounter (HOSPITAL_COMMUNITY)
Admission: RE | Disposition: A | Discharge status: Home / Self Care | Payer: Self-pay | Source: Ambulatory Visit | Attending: General Surgery

## 2015-11-08 ENCOUNTER — Ambulatory Visit (HOSPITAL_COMMUNITY): Payer: BLUE CROSS/BLUE SHIELD

## 2015-11-08 DIAGNOSIS — Z95828 Presence of other vascular implants and grafts: Secondary | ICD-10-CM

## 2015-11-08 DIAGNOSIS — C8590 Non-Hodgkin lymphoma, unspecified, unspecified site: Secondary | ICD-10-CM | POA: Diagnosis not present

## 2015-11-08 DIAGNOSIS — K219 Gastro-esophageal reflux disease without esophagitis: Secondary | ICD-10-CM | POA: Diagnosis not present

## 2015-11-08 DIAGNOSIS — F329 Major depressive disorder, single episode, unspecified: Secondary | ICD-10-CM | POA: Diagnosis not present

## 2015-11-08 DIAGNOSIS — C8593 Non-Hodgkin lymphoma, unspecified, intra-abdominal lymph nodes: Secondary | ICD-10-CM | POA: Diagnosis not present

## 2015-11-08 DIAGNOSIS — I96 Gangrene, not elsewhere classified: Secondary | ICD-10-CM | POA: Diagnosis not present

## 2015-11-08 DIAGNOSIS — Z419 Encounter for procedure for purposes other than remedying health state, unspecified: Secondary | ICD-10-CM

## 2015-11-08 DIAGNOSIS — C772 Secondary and unspecified malignant neoplasm of intra-abdominal lymph nodes: Secondary | ICD-10-CM | POA: Diagnosis not present

## 2015-11-08 DIAGNOSIS — Z452 Encounter for adjustment and management of vascular access device: Secondary | ICD-10-CM | POA: Diagnosis not present

## 2015-11-08 DIAGNOSIS — F419 Anxiety disorder, unspecified: Secondary | ICD-10-CM | POA: Diagnosis not present

## 2015-11-08 DIAGNOSIS — M549 Dorsalgia, unspecified: Secondary | ICD-10-CM | POA: Diagnosis not present

## 2015-11-08 DIAGNOSIS — S31109A Unspecified open wound of abdominal wall, unspecified quadrant without penetration into peritoneal cavity, initial encounter: Secondary | ICD-10-CM | POA: Diagnosis not present

## 2015-11-08 HISTORY — PX: PORTACATH PLACEMENT: SHX2246

## 2015-11-08 HISTORY — PX: ABDOMINAL WALL DEFECT REPAIR: SHX53

## 2015-11-08 LAB — CHROMOSOME ANALYSIS, BONE MARROW

## 2015-11-08 SURGERY — INSERTION, TUNNELED CENTRAL VENOUS DEVICE, WITH PORT
Anesthesia: General | Site: Abdomen

## 2015-11-08 MED ORDER — BUPIVACAINE HCL (PF) 0.25 % IJ SOLN
INTRAMUSCULAR | Status: AC
  Filled 2015-11-08: qty 30

## 2015-11-08 MED ORDER — FENTANYL CITRATE (PF) 100 MCG/2ML IJ SOLN
INTRAMUSCULAR | Status: AC
  Filled 2015-11-08: qty 2

## 2015-11-08 MED ORDER — LIDOCAINE 2% (20 MG/ML) 5 ML SYRINGE
INTRAMUSCULAR | Status: AC
  Filled 2015-11-08: qty 5

## 2015-11-08 MED ORDER — PROPOFOL 10 MG/ML IV BOLUS
INTRAVENOUS | Status: DC | PRN
Start: 2015-11-08 — End: 2015-11-08
  Administered 2015-11-08: 150 mg via INTRAVENOUS

## 2015-11-08 MED ORDER — IOPAMIDOL (ISOVUE-300) INJECTION 61%
INTRAVENOUS | Status: AC
  Filled 2015-11-08: qty 50

## 2015-11-08 MED ORDER — GLYCOPYRROLATE 0.2 MG/ML IJ SOLN
INTRAMUSCULAR | Status: DC | PRN
  Administered 2015-11-08: 0.4 mg via INTRAVENOUS

## 2015-11-08 MED ORDER — MIDAZOLAM HCL 5 MG/5ML IJ SOLN
INTRAMUSCULAR | Status: DC | PRN
  Administered 2015-11-08: 2 mg via INTRAVENOUS

## 2015-11-08 MED ORDER — ONDANSETRON HCL 4 MG/2ML IJ SOLN
INTRAMUSCULAR | Status: DC | PRN
  Administered 2015-11-08: 4 mg via INTRAVENOUS

## 2015-11-08 MED ORDER — FENTANYL CITRATE (PF) 100 MCG/2ML IJ SOLN
25.0000 ug | INTRAMUSCULAR | Status: DC | PRN
  Administered 2015-11-08 (×2): 50 ug via INTRAVENOUS

## 2015-11-08 MED ORDER — ACETAMINOPHEN 325 MG PO TABS: 650.0000 mg | ORAL_TABLET | ORAL | Status: DC | PRN

## 2015-11-08 MED ORDER — OXYCODONE HCL 5 MG PO TABS
ORAL_TABLET | ORAL | Status: AC
  Administered 2015-11-08: 5 mg via ORAL
  Filled 2015-11-08: qty 1

## 2015-11-08 MED ORDER — SODIUM CHLORIDE 0.9 % IV SOLN: 250.0000 mL | INTRAVENOUS | Status: DC | PRN

## 2015-11-08 MED ORDER — LACTATED RINGERS IV SOLN
INTRAVENOUS | Status: DC | PRN
  Administered 2015-11-08: 09:00:00 via INTRAVENOUS

## 2015-11-08 MED ORDER — MORPHINE SULFATE (PF) 2 MG/ML IV SOLN: 2.0000 mg | INTRAVENOUS | Status: DC | PRN

## 2015-11-08 MED ORDER — OXYCODONE-ACETAMINOPHEN 5-325 MG PO TABS: 1.0000 | ORAL_TABLET | ORAL | 0 refills | Status: DC | PRN

## 2015-11-08 MED ORDER — FENTANYL CITRATE (PF) 100 MCG/2ML IJ SOLN
INTRAMUSCULAR | Status: DC | PRN
  Administered 2015-11-08 (×2): 50 ug via INTRAVENOUS

## 2015-11-08 MED ORDER — ONDANSETRON HCL 4 MG/2ML IJ SOLN: 4.0000 mg | Freq: Once | INTRAMUSCULAR | Status: DC | PRN

## 2015-11-08 MED ORDER — ONDANSETRON HCL 4 MG/2ML IJ SOLN
INTRAMUSCULAR | Status: AC
  Filled 2015-11-08: qty 2

## 2015-11-08 MED ORDER — HYDROMORPHONE HCL 1 MG/ML IJ SOLN
0.5000 mg | INTRAMUSCULAR | Status: DC | PRN
  Administered 2015-11-08: 0.5 mg via INTRAVENOUS

## 2015-11-08 MED ORDER — LACTATED RINGERS IV SOLN
INTRAVENOUS | Status: DC
  Administered 2015-11-08: 09:00:00 via INTRAVENOUS

## 2015-11-08 MED ORDER — HEMOSTATIC AGENTS (NO CHARGE) OPTIME
TOPICAL | Status: DC | PRN
  Administered 2015-11-08: 1 via TOPICAL

## 2015-11-08 MED ORDER — PHENYLEPHRINE HCL 10 MG/ML IJ SOLN
INTRAMUSCULAR | Status: DC | PRN
  Administered 2015-11-08: 80 ug via INTRAVENOUS
  Administered 2015-11-08 (×4): 40 ug via INTRAVENOUS

## 2015-11-08 MED ORDER — VANCOMYCIN HCL IN DEXTROSE 1-5 GM/200ML-% IV SOLN
1000.0000 mg | INTRAVENOUS | Status: AC
  Administered 2015-11-08: 1000 mg via INTRAVENOUS
  Filled 2015-11-08: qty 200

## 2015-11-08 MED ORDER — PROPOFOL 10 MG/ML IV BOLUS
INTRAVENOUS | Status: AC
  Filled 2015-11-08: qty 20

## 2015-11-08 MED ORDER — PHENYLEPHRINE 40 MCG/ML (10ML) SYRINGE FOR IV PUSH (FOR BLOOD PRESSURE SUPPORT)
PREFILLED_SYRINGE | INTRAVENOUS | Status: AC
  Filled 2015-11-08: qty 10

## 2015-11-08 MED ORDER — SODIUM CHLORIDE 0.9% FLUSH: 3.0000 mL | INTRAVENOUS | Status: DC | PRN

## 2015-11-08 MED ORDER — EPHEDRINE 5 MG/ML INJ
INTRAVENOUS | Status: AC
  Filled 2015-11-08: qty 10

## 2015-11-08 MED ORDER — NEOSTIGMINE METHYLSULFATE 5 MG/5ML IV SOSY
PREFILLED_SYRINGE | INTRAVENOUS | Status: AC
  Filled 2015-11-08: qty 5

## 2015-11-08 MED ORDER — ACETAMINOPHEN 650 MG RE SUPP: 650.0000 mg | RECTAL | Status: DC | PRN

## 2015-11-08 MED ORDER — ROCURONIUM BROMIDE 10 MG/ML (PF) SYRINGE
PREFILLED_SYRINGE | INTRAVENOUS | Status: AC
  Filled 2015-11-08: qty 10

## 2015-11-08 MED ORDER — SODIUM CHLORIDE 0.9 % IV SOLN
INTRAVENOUS | Status: DC | PRN
  Administered 2015-11-08: 500 mL

## 2015-11-08 MED ORDER — FENTANYL CITRATE (PF) 100 MCG/2ML IJ SOLN
INTRAMUSCULAR | Status: AC
  Administered 2015-11-08: 50 ug via INTRAVENOUS
  Filled 2015-11-08: qty 2

## 2015-11-08 MED ORDER — OXYCODONE HCL 5 MG PO TABS
5.0000 mg | ORAL_TABLET | ORAL | Status: DC | PRN
  Administered 2015-11-08: 5 mg via ORAL

## 2015-11-08 MED ORDER — 0.9 % SODIUM CHLORIDE (POUR BTL) OPTIME
TOPICAL | Status: DC | PRN
  Administered 2015-11-08: 1000 mL

## 2015-11-08 MED ORDER — EPHEDRINE SULFATE 50 MG/ML IJ SOLN
INTRAMUSCULAR | Status: DC | PRN
  Administered 2015-11-08 (×2): 5 mg via INTRAVENOUS
  Administered 2015-11-08: 10 mg via INTRAVENOUS
  Administered 2015-11-08: 5 mg via INTRAVENOUS

## 2015-11-08 MED ORDER — NEOSTIGMINE METHYLSULFATE 10 MG/10ML IV SOLN
INTRAVENOUS | Status: DC | PRN
  Administered 2015-11-08: 3 mg via INTRAVENOUS

## 2015-11-08 MED ORDER — LIDOCAINE HCL (CARDIAC) 20 MG/ML IV SOLN
INTRAVENOUS | Status: DC | PRN
Start: 2015-11-08 — End: 2015-11-08
  Administered 2015-11-08: 100 mg via INTRAVENOUS

## 2015-11-08 MED ORDER — BUPIVACAINE HCL 0.25 % IJ SOLN
INTRAMUSCULAR | Status: DC | PRN
  Administered 2015-11-08: 6 mL

## 2015-11-08 MED ORDER — ROCURONIUM BROMIDE 100 MG/10ML IV SOLN
INTRAVENOUS | Status: DC | PRN
Start: 2015-11-08 — End: 2015-11-08
  Administered 2015-11-08: 40 mg via INTRAVENOUS

## 2015-11-08 MED ORDER — SODIUM CHLORIDE 0.9% FLUSH: 3.0000 mL | Freq: Two times a day (BID) | INTRAVENOUS | Status: DC

## 2015-11-08 MED ORDER — HYDROMORPHONE HCL 1 MG/ML IJ SOLN
INTRAMUSCULAR | Status: AC
  Administered 2015-11-08: 0.5 mg via INTRAVENOUS
  Filled 2015-11-08: qty 1

## 2015-11-08 MED ORDER — MIDAZOLAM HCL 2 MG/2ML IJ SOLN
INTRAMUSCULAR | Status: AC
  Filled 2015-11-08: qty 2

## 2015-11-08 MED ORDER — GLYCOPYRROLATE 0.2 MG/ML IV SOSY
PREFILLED_SYRINGE | INTRAVENOUS | Status: AC
  Filled 2015-11-08: qty 3

## 2015-11-08 SURGICAL SUPPLY — 65 items
BAG DECANTER FOR FLEXI CONT (MISCELLANEOUS) ×4 IMPLANT
BINDER ABDOMINAL 12 ML 46-62 (SOFTGOODS) ×4 IMPLANT
BLADE SURG 10 STRL SS (BLADE) ×4 IMPLANT
BLADE SURG 15 STRL LF DISP TIS (BLADE) ×4 IMPLANT
BLADE SURG 15 STRL SS (BLADE) ×4
BLADE SURG ROTATE 9660 (MISCELLANEOUS) IMPLANT
BNDG GAUZE ELAST 4 BULKY (GAUZE/BANDAGES/DRESSINGS) ×4 IMPLANT
CHLORAPREP W/TINT 10.5 ML (MISCELLANEOUS) ×4 IMPLANT
COVER MAYO STAND STRL (DRAPES) ×4 IMPLANT
COVER SURGICAL LIGHT HANDLE (MISCELLANEOUS) ×4 IMPLANT
COVER TRANSDUCER ULTRASND GEL (DRAPE) IMPLANT
CRADLE DONUT ADULT HEAD (MISCELLANEOUS) ×4 IMPLANT
DECANTER SPIKE VIAL GLASS SM (MISCELLANEOUS) ×4 IMPLANT
DERMABOND ADVANCED (GAUZE/BANDAGES/DRESSINGS) ×2
DERMABOND ADVANCED .7 DNX12 (GAUZE/BANDAGES/DRESSINGS) ×2 IMPLANT
DRAPE C-ARM 42X72 X-RAY (DRAPES) ×4 IMPLANT
DRAPE CHEST BREAST 15X10 FENES (DRAPES) ×4 IMPLANT
DRAPE LAPAROTOMY 100X72 PEDS (DRAPES) ×4 IMPLANT
DRAPE UTILITY XL STRL (DRAPES) ×8 IMPLANT
DRSG COVADERM 4X6 (GAUZE/BANDAGES/DRESSINGS) ×4 IMPLANT
ELECT CAUTERY BLADE 6.4 (BLADE) ×4 IMPLANT
ELECT REM PT RETURN 9FT ADLT (ELECTROSURGICAL) ×4
ELECTRODE REM PT RTRN 9FT ADLT (ELECTROSURGICAL) ×2 IMPLANT
GAUZE SPONGE 4X4 12PLY STRL (GAUZE/BANDAGES/DRESSINGS) ×4 IMPLANT
GAUZE SPONGE 4X4 16PLY XRAY LF (GAUZE/BANDAGES/DRESSINGS) ×4 IMPLANT
GEL ULTRASOUND 20GR AQUASONIC (MISCELLANEOUS) IMPLANT
GLOVE BIO SURGEON STRL SZ7 (GLOVE) ×4 IMPLANT
GLOVE BIO SURGEON STRL SZ7.5 (GLOVE) ×8 IMPLANT
GLOVE BIOGEL PI IND STRL 6.5 (GLOVE) ×4 IMPLANT
GLOVE BIOGEL PI IND STRL 7.5 (GLOVE) ×2 IMPLANT
GLOVE BIOGEL PI IND STRL 8 (GLOVE) ×4 IMPLANT
GLOVE BIOGEL PI INDICATOR 6.5 (GLOVE) ×4
GLOVE BIOGEL PI INDICATOR 7.5 (GLOVE) ×2
GLOVE BIOGEL PI INDICATOR 8 (GLOVE) ×4
GLOVE SURG SS PI 7.0 STRL IVOR (GLOVE) ×8 IMPLANT
GOWN STRL REUS W/ TWL LRG LVL3 (GOWN DISPOSABLE) ×4 IMPLANT
GOWN STRL REUS W/ TWL XL LVL3 (GOWN DISPOSABLE) ×4 IMPLANT
GOWN STRL REUS W/TWL LRG LVL3 (GOWN DISPOSABLE) ×4
GOWN STRL REUS W/TWL XL LVL3 (GOWN DISPOSABLE) ×4
INTRODUCER 13FR (MISCELLANEOUS) IMPLANT
INTRODUCER COOK 11FR (CATHETERS) IMPLANT
KIT BASIN OR (CUSTOM PROCEDURE TRAY) ×4 IMPLANT
KIT PORT POWER 8FR ISP CVUE (Catheter) ×4 IMPLANT
KIT ROOM TURNOVER OR (KITS) ×4 IMPLANT
LIQUID BAND (GAUZE/BANDAGES/DRESSINGS) ×4 IMPLANT
NEEDLE HYPO 25GX1X1/2 BEV (NEEDLE) ×4 IMPLANT
NS IRRIG 1000ML POUR BTL (IV SOLUTION) ×4 IMPLANT
PACK SURGICAL SETUP 50X90 (CUSTOM PROCEDURE TRAY) ×4 IMPLANT
PAD ARMBOARD 7.5X6 YLW CONV (MISCELLANEOUS) ×4 IMPLANT
PENCIL BUTTON HOLSTER BLD 10FT (ELECTRODE) ×4 IMPLANT
SET INTRODUCER 12FR PACEMAKER (SHEATH) IMPLANT
SET SHEATH INTRODUCER 10FR (MISCELLANEOUS) IMPLANT
SHEATH COOK PEEL AWAY SET 9F (SHEATH) IMPLANT
SPONGE LAP 18X18 X RAY DECT (DISPOSABLE) ×4 IMPLANT
SUT MNCRL AB 3-0 PS2 18 (SUTURE) ×4 IMPLANT
SUT PROLENE 2 0 SH 30 (SUTURE) ×4 IMPLANT
SUT SILK 2 0 SH (SUTURE) ×4 IMPLANT
SUT VIC AB 3-0 SH 27 (SUTURE) ×2
SUT VIC AB 3-0 SH 27X BRD (SUTURE) ×2 IMPLANT
SYR 20ML ECCENTRIC (SYRINGE) ×8 IMPLANT
SYR BULB IRRIGATION 50ML (SYRINGE) ×4 IMPLANT
SYR CONTROL 10ML LL (SYRINGE) ×4 IMPLANT
TAPE CLOTH SURG 4X10 WHT LF (GAUZE/BANDAGES/DRESSINGS) ×4 IMPLANT
TOWEL OR 17X24 6PK STRL BLUE (TOWEL DISPOSABLE) ×4 IMPLANT
TOWEL OR 17X26 10 PK STRL BLUE (TOWEL DISPOSABLE) ×4 IMPLANT

## 2015-11-08 NOTE — Anesthesia Procedure Notes (Signed)
Procedure Name: Intubation Date/Time: 11/08/2015 9:23 AM Performed by: Garrison Columbus T Pre-anesthesia Checklist: Patient identified, Emergency Drugs available, Suction available and Patient being monitored Patient Re-evaluated:Patient Re-evaluated prior to inductionOxygen Delivery Method: Circle System Utilized Preoxygenation: Pre-oxygenation with 100% oxygen Intubation Type: IV induction Ventilation: Mask ventilation without difficulty and Oral airway inserted - appropriate to patient size Laryngoscope Size: Sabra Heck and 2 Grade View: Grade I Tube type: Oral Tube size: 7.5 mm Number of attempts: 1 Airway Equipment and Method: Stylet and Oral airway Placement Confirmation: ETT inserted through vocal cords under direct vision,  positive ETCO2 and breath sounds checked- equal and bilateral Secured at: 22 cm Tube secured with: Tape Dental Injury: Teeth and Oropharynx as per pre-operative assessment

## 2015-11-08 NOTE — Op Note (Signed)
11/08/2015  10:19 AM  PATIENT:  Edward Steele  60 y.o. male  PRE-OPERATIVE DIAGNOSIS:  lymphoma  POST-OPERATIVE DIAGNOSIS: lymphoma and abdominal wall seroma  PROCEDURE:  Procedure(s): INSERTION PORT-A-CATH (N/A) EXPLORATION OF LOWER  ABDOMINAL WALL (N/A)  SURGEON:  Surgeon(s) and Role:    * Axel Filler, MD - Primary  ANESTHESIA:   local and general  EBL:  Total I/O In: 700 [I.V.:700] Out: 50 [Blood:15]  BLOOD ADMINISTERED:none  DRAINS: none   LOCAL MEDICATIONS USED:  BUPIVICAINE   SPECIMEN:  Source of Specimen:  ABDOMINAL WALL BIOPSY  DISPOSITION OF SPECIMEN:  PATHOLOGY  COUNTS:  YES  TOURNIQUET:  * No tourniquets in log *  DICTATION: .Dragon Dictation  Counts: reported as correct x 2  Findings:  Patient 8 French MRI-compatible power port placed in her left infraclavicular area. The tip lay at the junction of the SVC and atrial junction.  The port aspirated and flushed easily.  Indications for procedure:  The patient is a 60 y/o M with a new dx of lymphoma. Patient was scheduled for chemotherapy. Patient is currently postop from exploratory laparoscopy and small bowel resection. Patient lower midline incision has been having some serous output. Patient was scheduled for Port-A-Cath placement and lower abdominal wound exploration..  Details of the procedure: The patient was taken back to the operating room. The patient was placed in supine position with bilateral SCDs in place.  The patient was prepped and draped in the usual sterile fashion. After appropriate anitbiotics were confirmed, a time-out was confirmed and all facts were verified.  A 3 cm incision was made in the left deltopectoral groove. Bovie cautery was used to maintain hemostasis and dissection was taken down just superficial to the pectoralis fashion. Blunt dissection was used to create a pocket. The port was placed in this area check for a good fit. At this time a Seldinger technique was used to  cannulize the subclavian vein. This was confirmed with fluoroscopy. At this time the introducer was then placed over the wire, the wire removed. In the catheter was then placed into the introducer.  Under direct fluoroscopy the catheter was repositioned to be at [facility] /atrial junction. This was approximately 21 cm. At this time the port was connected to the catheter and secured. The port was sutured into the previously made pocket with 2-0 Prolene x3. 2-0 silk was then used to enter the junction of the catheter and port. A final x-ray was then shot to confirm the placement of the tip of the catheter as well as the curve the catheter to the port. At this time the [facility] needle was used to aspirate from the port and flushed easily.At this time the skin was reapproximated using a 3-0 Monocryl subcuticular fashion. The skin was Dermabond.   At this time the scope was taken down. The lower abdominal wound was then prepped with Betadine. This was sent draped. The opening in the lower midline wound was initially opened up with hemostats. This appeared to track cephalad. It was opened with cautery to maintain hemostasis. The abdominal wall was easily palpated. There was no defects of the abdominal wall. The suture was seen to be freely flowing anterior abdominal wall. Appeared to the abdominal wall healed at this time. There is no purulence seen in the subcutaneous space. This appeared to be a seroma-like cavity. A abdominal wall biopsy was taken from the lower portion of the wound. This was sent to pathology in formalin. The area was irrigated out.  The subcutaneous was debrided bluntly with the back side of the DeBakey pickup. At this time secondary to some oozing from the raw surfaces of the wound Arista was in placed into the wound. The wound was then packed with saline soaked gauze. This was dressed with 4 x 4's, ABD pad and tape. The patient tied the procedure well was taken to the recovery room in stable  condition.   PLAN OF CARE: Discharge to home after PACU  PATIENT DISPOSITION:  PACU - hemodynamically stable.   Delay start of Pharmacological VTE agent (>24hrs) due to surgical blood loss or risk of bleeding: not applicable

## 2015-11-08 NOTE — H&P (View-Only) (Signed)
History of Present Illness Ralene Ok MD; 10/25/2015 1:42 PM) Patient words: follow up.  The patient is a 60 year old male presenting for a post-operative visit. Patient is a 60 year old male status post small bowel resection for mass, and anemia. Patient's been doing well. He is had some tenderness to lower portion of his midline incision. Patient states she's had some leakage from his incision however this stopped over the last 24 hours.  Patient has now with oncology and is preparing for chemotherapy in approximately 3 weeks. Patient will require Port-A-Cath placement.   Allergies (Ammie Eversole, LPN; 075-GRM 075-GRM PM) Penicillins  Medication History (Ammie Eversole, LPN; 075-GRM 075-GRM PM) ClonazePAM (1MG  Tablet, Oral) Active. Dexilant (60MG  Capsule DR, Oral) Active. FLUoxetine HCl (20MG  Tablet, Oral) Active. Gabapentin (300MG  Capsule, Oral) Active. Rosuvastatin Calcium (5MG  Tablet, Oral) Active. Qvar (40MCG/ACT Aerosol Soln, Inhalation) Active. ProAir HFA (108 (90 Base)MCG/ACT Aerosol Soln, Inhalation) Active. Medications Reconciled  Vitals (Ammie Eversole LPN; 075-GRM [id number] PM) 10/25/2015 1:27 PM Weight: 189.8 lb Height: 72in Body Surface Area: 2.08 m Body Mass Index: 25.74 kg/m  Temp.: 98.43F(Oral)  Pulse: 86 (Regular)  BP: 124/70 (Sitting, Left Arm, Standard)    Physical Exam Ralene Ok MD; 10/25/2015 1:42 PM) Abdomen Note: Inferior midline wound area with some mild erythema, no leakage of fluid, other incision sites clean dry and intact.   Assessment & Plan Ralene Ok MD; 10/25/2015 1:46 PM) LYMPHOMA OF SMALL BOWEL (C85.93) Impression: Patient is a 60 year old male status post small bowel resection, exploratory laparoscopy for small bowel lymphoma 1. Will remove staples today. 2. Inferior portion of the midline wound appears to have mild cellulitis. We'll prescribe him clindamycin. 3. We'll plan on Port-A-Cath  placement. 4. We discussed the risks and benefits of Port-A-Cath placement to include but not limited to: Infection, bleeding, damage to structures, possible pneumothorax, possible need for chest tube catheter. Patient was understanding and wished to proceed.

## 2015-11-08 NOTE — Transfer of Care (Signed)
Immediate Anesthesia Transfer of Care Note  Patient: Edward Steele  Procedure(s) Performed: Procedure(s): INSERTION PORT-A-CATH (N/A) EXPLORE LOWER  ABDOMINAL WALL (N/A)  Patient Location: PACU  Anesthesia Type:General  Level of Consciousness: awake, alert  and oriented  Airway & Oxygen Therapy: Patient Spontanous Breathing and Patient connected to nasal cannula oxygen  Post-op Assessment: Report given to RN, Post -op Vital signs reviewed and stable and Patient moving all extremities X 4  Post vital signs: Reviewed and stable  Last Vitals:  Vitals:   11/08/15 0901  BP: 106/64  Pulse: 65  Resp: 20  Temp: 37 C    Last Pain:  Vitals:   11/08/15 0901  TempSrc: Oral         Complications: No apparent anesthesia complications

## 2015-11-08 NOTE — Discharge Summary (Signed)
Physician Discharge Summary  Patient ID: Edward Steele MRN: [id number] DOB/AGE: [birth year] 60 y.o.  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 Rosendo Gros 10/17/15.  Pathology was consistent with lymphoma.  He had a mild postop ileus that resolved completely by POD #5 and he was able to be discharged.  Discharge instructions were given to him.   Discharge Exam: Blood pressure (!) 141/80, pulse 73, temperature 98.9 F (37.2 C), temperature source Oral, resp. rate 16, height 5\' 10"  (1.778 m), weight 89.4 kg (197 lb), SpO2 99 %.   Disposition: 01-Home or Self Care     Medication List    TAKE these medications   albuterol 108 (90 Base) MCG/ACT inhaler Commonly known as:  PROVENTIL HFA;VENTOLIN HFA Inhale 2 puffs into the lungs every 6 (six) hours as needed for wheezing or shortness of breath.   clonazePAM 1 MG tablet Commonly known as:  KLONOPIN Take 1 mg by mouth 2 (two) times daily.   DEXILANT 60 MG capsule Generic drug:  dexlansoprazole Take 60 mg by mouth daily.   FLUoxetine 20 MG tablet Commonly known as:  PROZAC Take 40 mg by mouth 2 (two) times daily.   gabapentin 300 MG capsule Commonly known as:  NEURONTIN Take 300 mg by mouth at bedtime as needed (nerve pain).   oxyCODONE-acetaminophen 5-325 MG tablet Commonly known as:  PERCOCET/ROXICET Take 1-2 tablets by mouth every 4 (four) hours as needed for moderate pain.   QVAR 40 MCG/ACT inhaler Generic drug:  beclomethasone Inhale 1 puff into the lungs daily.   rosuvastatin 5 MG tablet Commonly known as:  CRESTOR Take 5 mg by mouth daily.        Signed: Odis Hollingshead 11/08/2015, 10:53 AM

## 2015-11-08 NOTE — Interval H&P Note (Signed)
History and Physical Interval Note:  11/08/2015 8:37 AM  Edward Steele  has presented today for surgery, with the diagnosis of lymphoma  The various methods of treatment have been discussed with the patient and family. After consideration of risks, benefits and other options for treatment, the patient has consented to  Procedure(s): INSERTION PORT-A-CATH (N/A) EXPLORE LOWER  ABDOMINAL WALL (N/A) as a surgical intervention .  The patient's history has been reviewed, patient examined, no change in status, stable for surgery.  I have reviewed the patient's chart and labs.  Questions were answered to the patient's satisfaction.     Rosario Jacks., Anne Hahn

## 2015-11-08 NOTE — Discharge Instructions (Signed)
Incision and Drainage, Care After Refer to this sheet in the next few weeks. These instructions provide you with information on caring for yourself after your procedure. Your caregiver may also give you more specific instructions. Your treatment has been planned according to current medical practices, but problems sometimes occur. Call your caregiver if you have any problems or questions after your procedure. HOME CARE INSTRUCTIONS   If antibiotic medicine is given, take it as directed. Finish it even if you start to feel better.  Only take over-the-counter or prescription medicines for pain, discomfort, or fever as directed by your caregiver.  Keep all follow-up appointments as directed by your caregiver.  Change any bandages (dressings) as directed by your caregiver. Replace old dressings with clean dressings.  Wash your hands before and after caring for your wound. You will receive specific instructions for cleansing and caring for your wound.  SEEK MEDICAL CARE IF:   You have increased pain, swelling, or redness around the wound.  You have increased drainage, smell, or bleeding from the wound.  You have muscle aches, chills, or you feel generally sick.  You have a fever. MAKE SURE YOU:   Understand these instructions.  Will watch your condition.  Will get help right away if you are not doing well or get worse.   This information is not intended to replace advice given to you by your health care provider. Make sure you discuss any questions you have with your health care provider.   Document Released: 05/20/2011 Document Revised: 03/18/2014 Document Reviewed: 05/20/2011 Elsevier Interactive Patient Education 2016 [employer]  

## 2015-11-08 NOTE — Anesthesia Postprocedure Evaluation (Signed)
Anesthesia Post Note  Patient: [name]  Procedure(s) Performed: Procedure(s) (LRB): INSERTION PORT-A-CATH (N/A) EXPLORE LOWER  ABDOMINAL WALL WITH BIOPSY (N/A)  Patient location during evaluation: PACU Anesthesia Type: General Level of consciousness: awake and alert Pain management: pain level controlled Vital Signs Assessment: post-procedure vital signs reviewed and stable Respiratory status: spontaneous breathing, nonlabored ventilation, respiratory function stable and patient connected to nasal cannula oxygen Cardiovascular status: blood pressure returned to baseline and stable Postop Assessment: no signs of nausea or vomiting Anesthetic complications: no    Last Vitals:  Vitals:   11/08/15 1120 11/08/15 1130  BP: 106/71   Pulse: (!) 55 (!) 59  Resp: 10 (!) 7  Temp:      Last Pain:  Vitals:   11/08/15 1111  TempSrc:   PainSc: 7                  Zenaida Deed

## 2015-11-08 NOTE — Anesthesia Preprocedure Evaluation (Addendum)
Anesthesia Evaluation  Patient identified by MRN, date of birth, ID band Patient awake    Reviewed: Allergy & Precautions, H&P , NPO status , Patient's Chart, lab work & pertinent test results  History of Anesthesia Complications Negative for: history of anesthetic complications  Airway Mallampati: II  TM Distance: >3 FB Neck ROM: full    Dental no notable dental hx. (+) Dental Advisory Given   Pulmonary neg pulmonary ROS,    Pulmonary exam normal breath sounds clear to auscultation       Cardiovascular negative cardio ROS Normal cardiovascular exam Rhythm:regular Rate:Normal     Neuro/Psych PSYCHIATRIC DISORDERS Anxiety Depression negative neurological ROS     GI/Hepatic negative GI ROS, Neg liver ROS, GERD  ,  Endo/Other  negative endocrine ROS  Renal/GU negative Renal ROS     Musculoskeletal   Abdominal   Peds  Hematology  (+) anemia ,   Anesthesia Other Findings   Reproductive/Obstetrics negative OB ROS                            Anesthesia Physical Anesthesia Plan  ASA: II  Anesthesia Plan: General   Post-op Pain Management:    Induction: Intravenous  Airway Management Planned: LMA  Additional Equipment:   Intra-op Plan:   Post-operative Plan: Extubation in OR  Informed Consent: I have reviewed the patients History and Physical, chart, labs and discussed the procedure including the risks, benefits and alternatives for the proposed anesthesia with the patient or authorized representative who has indicated his/her understanding and acceptance.   Dental advisory given  Plan Discussed with: CRNA, Anesthesiologist and Surgeon  Anesthesia Plan Comments:        Anesthesia Quick Evaluation

## 2015-11-09 ENCOUNTER — Encounter (HOSPITAL_COMMUNITY): Payer: Self-pay | Admitting: General Surgery

## 2015-11-10 ENCOUNTER — Ambulatory Visit (HOSPITAL_COMMUNITY)
Admission: RE | Admit: 2015-11-10 | Discharge: 2015-11-10 | Disposition: A | Discharge status: Home / Self Care | Payer: BLUE CROSS/BLUE SHIELD | Source: Ambulatory Visit | Attending: Internal Medicine | Admitting: Internal Medicine

## 2015-11-10 ENCOUNTER — Encounter (HOSPITAL_COMMUNITY): Payer: Self-pay | Admitting: Internal Medicine

## 2015-11-10 VITALS — BP 112/74 | HR 67 | Ht 70.0 in | Wt 192.8 lb

## 2015-11-10 DIAGNOSIS — F329 Major depressive disorder, single episode, unspecified: Secondary | ICD-10-CM | POA: Diagnosis not present

## 2015-11-10 DIAGNOSIS — Z79899 Other long term (current) drug therapy: Secondary | ICD-10-CM | POA: Diagnosis not present

## 2015-11-10 DIAGNOSIS — F419 Anxiety disorder, unspecified: Secondary | ICD-10-CM | POA: Insufficient documentation

## 2015-11-10 DIAGNOSIS — T451X5A Adverse effect of antineoplastic and immunosuppressive drugs, initial encounter: Secondary | ICD-10-CM | POA: Diagnosis not present

## 2015-11-10 DIAGNOSIS — C8593 Non-Hodgkin lymphoma, unspecified, intra-abdominal lymph nodes: Secondary | ICD-10-CM

## 2015-11-10 DIAGNOSIS — I427 Cardiomyopathy due to drug and external agent: Secondary | ICD-10-CM | POA: Diagnosis not present

## 2015-11-10 DIAGNOSIS — I509 Heart failure, unspecified: Secondary | ICD-10-CM | POA: Diagnosis not present

## 2015-11-10 DIAGNOSIS — Z09 Encounter for follow-up examination after completed treatment for conditions other than malignant neoplasm: Secondary | ICD-10-CM

## 2015-11-10 DIAGNOSIS — K219 Gastro-esophageal reflux disease without esophagitis: Secondary | ICD-10-CM | POA: Diagnosis not present

## 2015-11-10 DIAGNOSIS — Z880 Allergy status to penicillin: Secondary | ICD-10-CM | POA: Diagnosis not present

## 2015-11-10 NOTE — Progress Notes (Signed)
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 Lindi Adie for enrollment into the [facility].    SUMMARY OF ONCOLOGIC HISTORY:       Diffuse large B-cell lymphoma of extranodal site ([place])   10/17/2015 Initial Diagnosis    Small intestine resection, proximal ileum: Involvement by diffuse large B-cell lymphoma,. Biopsy of peritoneal implant: Invol by a DLBCL.positive for CD20, CD79a,CD10, bcl-6, and bcl-2. CD3 and CD5 highlight scattered admixed T-cells. CD34 and TdT neg     10/31/2015 Pathology Results    Bone marrow biopsy: Hypercellular bone marrow for age 61-60% with trilineage hematopoiesis, plasma cells 9% with Kappa Light chain excess     11/02/2015 PET scan    Nodular thickening within the mesenteries of small bowel 17 mm concerning for peritoneal implant of high-grade lymphoma, activity along the abdominal wound with inflammation suspicion for infection/abscess/fistula of bladder     11/02/2015 Imaging    Ultrasound scrotum: Normal testes, no epididymitis, no varicocele or hydrocele      Denies h/o known heart disease. Had heart cath in [place] about 5 years ago with clean coronaries.   Had intestinal resection on 10/17/15. On 8/30 had wound re-opened as it was not healing fully and continued to drain. Now healing by secondary intestinal.  Starting chemo with CHOP x 6 (including adriamycin) on 11/17/15.  Appetite ok.  No Cp or SOB.   ECHO EF 55-60% Normal RV. Normal diastolic parameters (images reviewed with him and his wife personally)     Review of Systems: [y] = yes, '[ ]'  = no   General: Weight gain '[ ]' ; Weight loss '[ ]' ; Anorexia '[ ]' ; Fatigue '[ ]' ; Fever '[ ]' ; Chills '[ ]' ; Weakness '[ ]'   Cardiac: Chest pain/pressure '[ ]' ; Resting SOB '[ ]' ; Exertional SOB '[ ]' ; Orthopnea '[ ]' ; Pedal Edema '[ ]' ; Palpitations '[ ]' ; Syncope '[ ]' ;  Presyncope '[ ]' ; Paroxysmal nocturnal dyspnea'[ ]'   Pulmonary: Cough '[ ]' ; Wheezing'[ ]' ; Hemoptysis'[ ]' ; Sputum '[ ]' ; Snoring '[ ]'   GI: Vomiting'[ ]' ; Dysphagia'[ ]' ; Melena'[ ]' ; Hematochezia '[ ]' ; Heartburn'[ ]' ; Abdominal pain '[ ]' ; Constipation '[ ]' ; Diarrhea '[ ]' ; BRBPR '[ ]'   GU: Hematuria'[ ]' ; Dysuria '[ ]' ; Nocturia'[ ]'   Vascular: Pain in legs with walking '[ ]' ; Pain in feet with lying flat '[ ]' ; Non-healing sores '[ ]' ; Stroke '[ ]' ; TIA '[ ]' ; Slurred speech '[ ]' ;  Neuro: Headaches'[ ]' ; Vertigo'[ ]' ; Seizures'[ ]' ; Paresthesias'[ ]' ;Blurred vision '[ ]' ; Diplopia '[ ]' ; Vision changes '[ ]'   Ortho/Skin: Arthritis '[ ]' ; Joint pain '[ ]' ; Muscle pain '[ ]' ; Joint swelling '[ ]' ; Back Pain '[ ]' ; Rash '[ ]'   Psych: Depression'[ ]' ; Anxiety'[ ]'   Heme: Bleeding problems '[ ]' ; Clotting disorders '[ ]' ; Anemia '[ ]'   Endocrine: Diabetes '[ ]' ; Thyroid dysfunction'[ ]'    Past Medical History:  Diagnosis Date  . Anemia    oral iron supplement used  . Anxiety   . Cancer ([place])    skin cancer - face basal and squamous  . Chronic back pain    lower back  . Constipation   . Depression   . GERD (gastroesophageal reflux disease)   . Head injury, closed, with concussion ([place])   . History of kidney stones    x 4 episodes-no recent issues  . Pneumonia    age 62  . Recurrent  sinus infections    past history-none recent " Inhalers were for this- no use im many yrs"    Current Outpatient Prescriptions  Medication Sig Dispense Refill  . albuterol (PROVENTIL HFA;VENTOLIN HFA) 108 (90 Base) MCG/ACT inhaler Inhale 2 puffs into the lungs every 6 (six) hours as needed for wheezing or shortness of breath.     . allopurinol (ZYLOPRIM) 300 MG tablet Take 1 tablet (300 mg total) by mouth daily. 30 tablet 3  . beclomethasone (QVAR) 40 MCG/ACT inhaler Inhale 1 puff into the lungs daily.    . clonazePAM (KLONOPIN) 1 MG tablet Take 1 mg by mouth 2 (two) times daily.    Marland Kitchen DEXILANT 60 MG capsule Take 60 mg by mouth daily.    . ferrous sulfate 325 (65 FE) MG tablet Take 325  mg by mouth daily as needed (supplement).    Marland Kitchen FLUoxetine (PROZAC) 20 MG tablet Take 40 mg by mouth 2 (two) times daily.    Marland Kitchen gabapentin (NEURONTIN) 300 MG capsule Take 300 mg by mouth at bedtime as needed (nerve pain).     Marland Kitchen lidocaine-prilocaine (EMLA) cream Apply to affected area once 30 g 3  . LORazepam (ATIVAN) 0.5 MG tablet Take 1 tablet (0.5 mg total) by mouth every 6 (six) hours as needed (Nausea or vomiting). 30 tablet 0  . ondansetron (ZOFRAN) 8 MG tablet Take 1 tablet (8 mg total) by mouth every 8 (eight) hours as needed for refractory nausea / vomiting. 30 tablet 1  . oxyCODONE-acetaminophen (ROXICET) 5-325 MG tablet Take 1-2 tablets by mouth every 4 (four) hours as needed. 30 tablet 0  . predniSONE (DELTASONE) 20 MG tablet Take 3 tablets (60 mg total) by mouth daily. Take on days 1-5 of chemotherapy. 90 tablet 0  . prochlorperazine (COMPAZINE) 10 MG tablet Take 1 tablet (10 mg total) by mouth every 6 (six) hours as needed (Nausea or vomiting). 30 tablet 6  . rosuvastatin (CRESTOR) 5 MG tablet Take 5 mg by mouth daily.     No current facility-administered medications for this encounter.     Allergies  Allergen Reactions  . Penicillin G Hives    Childhood allergy Has patient had a PCN reaction causing immediate rash, facial/tongue/throat swelling, SOB or lightheadedness with hypotension: n/a Has patient had a PCN reaction causing severe rash involving mucus membranes or skin necrosis: n/a Has patient had a PCN reaction that required hospitalization: n/a Has patient had a PCN reaction occurring within the last 10 years:  n/a If all of the above answers are "NO", then may proceed with Cephalosporin use.      Social History   Social History  . Marital status: Married    Spouse name: N/A  . Number of children: N/A  . Years of education: N/A   Occupational History  . Not on file.   Social History Main Topics  . Smoking status: Never Smoker  . Smokeless tobacco: Never Used    . Alcohol use No  . Drug use: No  . Sexual activity: Yes   Other Topics Concern  . Not on file   Social History Narrative  . No narrative on file   Family Hx:  Dad with CABG x 4 in 50s Mom alive CAD s/p MI, AF   Vitals:   11/10/15 0954  BP: 112/74  Pulse: 67  SpO2: 96%  Weight: 192 lb 12.8 oz (87.5 kg)  Height: '5\' 10"'  (1.778 m)    PHYSICAL EXAM: General:  Well appearing. No  respiratory difficulty HEENT: normal Neck: supple. no JVD. Carotids 2+ bilat; no bruits. No lymphadenopathy or thryomegaly appreciated. Cor: PMI nondisplaced. Regular rate & rhythm. No rubs, gallops or murmurs. Left port-a-cath Lungs: clear Abdomen: soft, nontender, nondistended. + ab binder with wound covered  No hepatosplenomegaly. No bruits or masses. Good bowel sounds. Extremities: no cyanosis, clubbing, rash, edema Neuro: alert & oriented x 3, cranial nerves grossly intact. moves all 4 extremities w/o difficulty. Affect pleasant.  ECG:  NSR 61 No ST-T wave abnormalities.    ASSESSMENT & PLAN: 1. Small intestinal diffuse large B-cell lymphoma  -s/p resection -c/b recent incisional infection -plan CHOP chemotherapy -I reviewed echos personally. EF and Doppler parameters stable. No HF on exam. Will repeat echo after 4th cycle and then will see back in 6 months for repeat echo to ensure stability. I explained incidence of Adriamycin cardiotoxicity in detail include small possibility of very delayed toxicity.     Christa Fasig,MD 12:34 AM

## 2015-11-10 NOTE — Progress Notes (Signed)
Echocardiogram 2D Echocardiogram has been performed.  Edward Steele 11/10/2015, 9:51 AM

## 2015-11-10 NOTE — Addendum Note (Signed)
Encounter addended by: Effie Berkshire, RN on: 11/10/2015 10:49 AM<BR>    Actions taken: Order Entry activity accessed, Diagnosis association updated, Sign clinical note

## 2015-11-10 NOTE — Patient Instructions (Signed)
Follow up 3 months with echocardiogram and appointment with Dr. Bensimhon.  

## 2015-11-13 ENCOUNTER — Encounter: Payer: Self-pay | Admitting: Hematology and Oncology

## 2015-11-14 ENCOUNTER — Other Ambulatory Visit: Payer: Self-pay

## 2015-11-14 DIAGNOSIS — C8339 Diffuse large B-cell lymphoma, extranodal and solid organ sites: Secondary | ICD-10-CM

## 2015-11-17 ENCOUNTER — Other Ambulatory Visit (HOSPITAL_BASED_OUTPATIENT_CLINIC_OR_DEPARTMENT_OTHER): Payer: BLUE CROSS/BLUE SHIELD

## 2015-11-17 ENCOUNTER — Ambulatory Visit (HOSPITAL_BASED_OUTPATIENT_CLINIC_OR_DEPARTMENT_OTHER): Payer: BLUE CROSS/BLUE SHIELD

## 2015-11-17 VITALS — BP 103/60 | HR 77 | Temp 98.0°F | Resp 18

## 2015-11-17 DIAGNOSIS — Z5189 Encounter for other specified aftercare: Secondary | ICD-10-CM

## 2015-11-17 DIAGNOSIS — Z5112 Encounter for antineoplastic immunotherapy: Secondary | ICD-10-CM

## 2015-11-17 DIAGNOSIS — C8339 Diffuse large B-cell lymphoma, extranodal and solid organ sites: Secondary | ICD-10-CM | POA: Diagnosis not present

## 2015-11-17 DIAGNOSIS — Z5111 Encounter for antineoplastic chemotherapy: Secondary | ICD-10-CM

## 2015-11-17 LAB — CBC WITH DIFFERENTIAL/PLATELET
BASO%: 0.7 % (ref 0.0–2.0)
BASOS ABS: 0 10*3/uL (ref 0.0–0.1)
EOS%: 5.3 % (ref 0.0–7.0)
Eosinophils Absolute: 0.3 10*3/uL (ref 0.0–0.5)
HEMATOCRIT: 40.7 % (ref 38.4–49.9)
HEMOGLOBIN: 13.4 g/dL (ref 13.0–17.1)
LYMPH#: 1.2 10*3/uL (ref 0.9–3.3)
LYMPH%: 20.9 % (ref 14.0–49.0)
MCH: 27.6 pg (ref 27.2–33.4)
MCHC: 33 g/dL (ref 32.0–36.0)
MCV: 83.7 fL (ref 79.3–98.0)
MONO#: 0.4 10*3/uL (ref 0.1–0.9)
MONO%: 6.2 % (ref 0.0–14.0)
NEUT#: 3.9 10*3/uL (ref 1.5–6.5)
NEUT%: 66.9 % (ref 39.0–75.0)
PLATELETS: 201 10*3/uL (ref 140–400)
RBC: 4.86 10*6/uL (ref 4.20–5.82)
RDW: 16 % — ABNORMAL HIGH (ref 11.0–14.6)
WBC: 5.9 10*3/uL (ref 4.0–10.3)

## 2015-11-17 LAB — COMPREHENSIVE METABOLIC PANEL
ALBUMIN: 3.5 g/dL (ref 3.5–5.0)
ALK PHOS: 79 U/L (ref 40–150)
ALT: 22 U/L (ref 0–55)
ANION GAP: 9 meq/L (ref 3–11)
AST: 28 U/L (ref 5–34)
BILIRUBIN TOTAL: 0.5 mg/dL (ref 0.20–1.20)
BUN: 23.2 mg/dL (ref 7.0–26.0)
CALCIUM: 9.4 mg/dL (ref 8.4–10.4)
CHLORIDE: 102 meq/L (ref 98–109)
CO2: 27 mEq/L (ref 22–29)
CREATININE: 1.1 mg/dL (ref 0.7–1.3)
EGFR: 71 mL/min/{1.73_m2} — ABNORMAL LOW (ref >90)
Glucose: 109 mg/dl (ref 70–140)
Potassium: 4.3 mEq/L (ref 3.5–5.1)
Sodium: 137 mEq/L (ref 136–145)
TOTAL PROTEIN: 8.5 g/dL — AB (ref 6.4–8.3)

## 2015-11-17 MED ORDER — PALONOSETRON HCL INJECTION 0.25 MG/5ML
INTRAVENOUS | Status: AC
  Filled 2015-11-17: qty 5

## 2015-11-17 MED ORDER — SODIUM CHLORIDE 0.9 % IV SOLN
375.0000 mg/m2 | Freq: Once | INTRAVENOUS | Status: AC
  Administered 2015-11-17: 800 mg via INTRAVENOUS
  Filled 2015-11-17: qty 30

## 2015-11-17 MED ORDER — DOXORUBICIN HCL CHEMO IV INJECTION 2 MG/ML
50.0000 mg/m2 | Freq: Once | INTRAVENOUS | Status: AC
  Administered 2015-11-17: 102 mg via INTRAVENOUS
  Filled 2015-11-17: qty 51

## 2015-11-17 MED ORDER — VINCRISTINE SULFATE CHEMO INJECTION 1 MG/ML
2.0000 mg | Freq: Once | INTRAVENOUS | Status: AC
  Administered 2015-11-17: 2 mg via INTRAVENOUS
  Filled 2015-11-17: qty 2

## 2015-11-17 MED ORDER — DIPHENHYDRAMINE HCL 25 MG PO CAPS
ORAL_CAPSULE | ORAL | Status: AC
  Filled 2015-11-17: qty 2

## 2015-11-17 MED ORDER — ACETAMINOPHEN 325 MG PO TABS
650.0000 mg | ORAL_TABLET | Freq: Once | ORAL | Status: AC
  Administered 2015-11-17: 650 mg via ORAL

## 2015-11-17 MED ORDER — PEGFILGRASTIM 6 MG/0.6ML ~~LOC~~ PSKT
6.0000 mg | PREFILLED_SYRINGE | Freq: Once | SUBCUTANEOUS | Status: AC
  Administered 2015-11-17: 6 mg via SUBCUTANEOUS
  Filled 2015-11-17: qty 0.6

## 2015-11-17 MED ORDER — SODIUM CHLORIDE 0.9 % IV SOLN
10.0000 mg | Freq: Once | INTRAVENOUS | Status: AC
  Administered 2015-11-17: 10 mg via INTRAVENOUS
  Filled 2015-11-17: qty 1

## 2015-11-17 MED ORDER — SODIUM CHLORIDE 0.9% FLUSH
10.0000 mL | INTRAVENOUS | Status: DC | PRN
  Administered 2015-11-17: 10 mL
  Filled 2015-11-17: qty 10

## 2015-11-17 MED ORDER — SODIUM CHLORIDE 0.9 % IV SOLN
Freq: Once | INTRAVENOUS | Status: AC
  Administered 2015-11-17: 09:00:00 via INTRAVENOUS

## 2015-11-17 MED ORDER — ACETAMINOPHEN 325 MG PO TABS
ORAL_TABLET | ORAL | Status: AC
  Filled 2015-11-17: qty 2

## 2015-11-17 MED ORDER — HEPARIN SOD (PORK) LOCK FLUSH 100 UNIT/ML IV SOLN
500.0000 [IU] | Freq: Once | INTRAVENOUS | Status: AC | PRN
  Administered 2015-11-17: 500 [IU]
  Filled 2015-11-17: qty 5

## 2015-11-17 MED ORDER — PALONOSETRON HCL INJECTION 0.25 MG/5ML
0.2500 mg | Freq: Once | INTRAVENOUS | Status: AC
  Administered 2015-11-17: 0.25 mg via INTRAVENOUS

## 2015-11-17 MED ORDER — SODIUM CHLORIDE 0.9 % IV SOLN
750.0000 mg/m2 | Freq: Once | INTRAVENOUS | Status: AC
  Administered 2015-11-17: 1540 mg via INTRAVENOUS
  Filled 2015-11-17: qty 77

## 2015-11-17 MED ORDER — DIPHENHYDRAMINE HCL 25 MG PO CAPS
50.0000 mg | ORAL_CAPSULE | Freq: Once | ORAL | Status: AC
Start: 2015-11-17 — End: 2015-11-17
  Administered 2015-11-17: 50 mg via ORAL

## 2015-11-17 NOTE — Patient Instructions (Signed)
[facility] Discharge Instructions for Patients Receiving Chemotherapy  Today you received the following chemotherapy agents: Adriamycin, Vincristine, Cytoxan, Rituxan.   To help prevent nausea and vomiting after your treatment, we encourage you to take your nausea medication as prescribed.   If you develop nausea and vomiting that is not controlled by your nausea medication, call the clinic.   BELOW ARE SYMPTOMS THAT SHOULD BE REPORTED IMMEDIATELY:  *FEVER GREATER THAN 100.5 F  *CHILLS WITH OR WITHOUT FEVER  NAUSEA AND VOMITING THAT IS NOT CONTROLLED WITH YOUR NAUSEA MEDICATION  *UNUSUAL SHORTNESS OF BREATH  *UNUSUAL BRUISING OR BLEEDING  TENDERNESS IN MOUTH AND THROAT WITH OR WITHOUT PRESENCE OF ULCERS  *URINARY PROBLEMS  *BOWEL PROBLEMS  UNUSUAL RASH Items with * indicate a potential emergency and should be followed up as soon as possible.  Feel free to call the clinic you have any questions or concerns. The clinic phone number is [phone].  Please show the [place] at check-in to the Emergency Department and triage nurse.  (Adriamycin)  Doxorubicin injection What is this medicine? DOXORUBICIN (dox oh ROO bi sin) is a chemotherapy drug. It is used to treat many kinds of cancer like Hodgkin's disease, leukemia, non-Hodgkin's lymphoma, neuroblastoma, sarcoma, and Wilms' tumor. It is also used to treat bladder cancer, breast cancer, lung cancer, ovarian cancer, stomach cancer, and thyroid cancer. This medicine may be used for other purposes; ask your health care provider or pharmacist if you have questions. What should I tell my health care provider before I take this medicine? They need to know if you have any of these conditions: -blood disorders -heart disease, recent heart attack -infection (especially a virus infection such as chickenpox, cold sores, or herpes) -irregular heartbeat -liver disease -recent or ongoing radiation therapy -an  unusual or allergic reaction to doxorubicin, other chemotherapy agents, other medicines, foods, dyes, or preservatives -pregnant or trying to get pregnant -breast-feeding How should I use this medicine? This drug is given as an infusion into a vein. It is administered in a hospital or clinic by a specially trained health care professional. If you have pain, swelling, burning or any unusual feeling around the site of your injection, tell your health care professional right away. Talk to your pediatrician regarding the use of this medicine in children. Special care may be needed. Overdosage: If you think you have taken too much of this medicine contact a poison control center or emergency room at once. NOTE: This medicine is only for you. Do not share this medicine with others. What if I miss a dose? It is important not to miss your dose. Call your doctor or health care professional if you are unable to keep an appointment. What may interact with this medicine? Do not take this medicine with any of the following medications: -cisapride -droperidol -halofantrine -pimozide -zidovudine This medicine may also interact with the following medications: -chloroquine -chlorpromazine -clarithromycin -cyclophosphamide -cyclosporine -erythromycin -medicines for depression, anxiety, or psychotic disturbances -medicines for irregular heart beat like amiodarone, bepridil, dofetilide, encainide, flecainide, propafenone, quinidine -medicines for seizures like ethotoin, fosphenytoin, phenytoin -medicines for nausea, vomiting like dolasetron, ondansetron, palonosetron -medicines to increase blood counts like filgrastim, pegfilgrastim, sargramostim -methadone -methotrexate -pentamidine -progesterone -vaccines -verapamil Talk to your doctor or health care professional before taking any of these medicines: -acetaminophen -aspirin -ibuprofen -ketoprofen -naproxen This list may not describe all  possible interactions. Give your health care provider a list of all the medicines, herbs, non-prescription drugs, or dietary supplements  you use. Also tell them if you smoke, drink alcohol, or use illegal drugs. Some items may interact with your medicine. What should I watch for while using this medicine? Your condition will be monitored carefully while you are receiving this medicine. You will need important blood work done while you are taking this medicine. This drug may make you feel generally unwell. This is not uncommon, as chemotherapy can affect healthy cells as well as cancer cells. Report any side effects. Continue your course of treatment even though you feel ill unless your doctor tells you to stop. Your urine may turn red for a few days after your dose. This is not blood. If your urine is dark or brown, call your doctor. In some cases, you may be given additional medicines to help with side effects. Follow all directions for their use. Call your doctor or health care professional for advice if you get a fever, chills or sore throat, or other symptoms of a cold or flu. Do not treat yourself. This drug decreases your body's ability to fight infections. Try to avoid being around people who are sick. This medicine may increase your risk to bruise or bleed. Call your doctor or health care professional if you notice any unusual bleeding. Be careful brushing and flossing your teeth or using a toothpick because you may get an infection or bleed more easily. If you have any dental work done, tell your dentist you are receiving this medicine. Avoid taking products that contain aspirin, acetaminophen, ibuprofen, naproxen, or ketoprofen unless instructed by your doctor. These medicines may hide a fever. Men and women of childbearing age should use effective birth control methods while using taking this medicine. Do not become pregnant while taking this medicine. There is a potential for serious side  effects to an unborn child. Talk to your health care professional or pharmacist for more information. Do not breast-feed an infant while taking this medicine. Do not let others touch your urine or other body fluids for 5 days after each treatment with this medicine. Caregivers should wear latex gloves to avoid touching body fluids during this time. There is a maximum amount of this medicine you should receive throughout your life. The amount depends on the medical condition being treated and your overall health. Your doctor will watch how much of this medicine you receive in your lifetime. Tell your doctor if you have taken this medicine before. What side effects may I notice from receiving this medicine? Side effects that you should report to your doctor or health care professional as soon as possible: -allergic reactions like skin rash, itching or hives, swelling of the face, lips, or tongue -low blood counts - this medicine may decrease the number of white blood cells, red blood cells and platelets. You may be at increased risk for infections and bleeding. -signs of infection - fever or chills, cough, sore throat, pain or difficulty passing urine -signs of decreased platelets or bleeding - bruising, pinpoint red spots on the skin, black, tarry stools, blood in the urine -signs of decreased red blood cells - unusually weak or tired, fainting spells, lightheadedness -breathing problems -chest pain -fast, irregular heartbeat -mouth sores -nausea, vomiting -pain, swelling, redness at site where injected -pain, tingling, numbness in the hands or feet -swelling of ankles, feet, or hands -unusual bleeding or bruising Side effects that usually do not require medical attention (report to your doctor or health care professional if they continue or are bothersome): -diarrhea -facial flushing -hair  loss -loss of appetite -missed menstrual periods -nail discoloration or damage -red or watery  eyes -red colored urine -stomach upset This list may not describe all possible side effects. Call your doctor for medical advice about side effects. You may report side effects to FDA at 1-800-FDA-1088. Where should I keep my medicine? This drug is given in a hospital or clinic and will not be stored at home. NOTE: This sheet is a summary. It may not cover all possible information. If you have questions about this medicine, talk to your doctor, pharmacist, or health care provider.   Vincristine injection What is this medicine? VINCRISTINE (vin KRIS teen) is a chemotherapy drug. It slows the growth of cancer cells. This medicine is used to treat many types of cancer like Hodgkin's disease, leukemia, non-Hodgkin's lymphoma, neuroblastoma (brain cancer), rhabdomyosarcoma, and Wilms' tumor. This medicine may be used for other purposes; ask your health care provider or pharmacist if you have questions. What should I tell my health care provider before I take this medicine? They need to know if you have any of these conditions: -blood disorders -gout -infection (especially chickenpox, cold sores, or herpes) -kidney disease -liver disease -lung disease -nervous system disease like Charcot-Marie-Tooth (CMT) -recent or ongoing radiation therapy -an unusual or allergic reaction to vincristine, other chemotherapy agents, other medicines, foods, dyes, or preservatives -pregnant or trying to get pregnant -breast-feeding How should I use this medicine? This drug is given as an infusion into a vein. It is administered in a hospital or clinic by a specially trained health care professional. If you have pain, swelling, burning, or any unusual feeling around the site of your injection, tell your health care professional right away. Talk to your pediatrician regarding the use of this medicine in children. While this drug may be prescribed for selected conditions, precautions do apply. Overdosage: If you  think you have taken too much of this medicine contact a poison control center or emergency room at once. NOTE: This medicine is only for you. Do not share this medicine with others. What if I miss a dose? It is important not to miss your dose. Call your doctor or health care professional if you are unable to keep an appointment. What may interact with this medicine? Do not take this medicine with any of the following medications: -itraconazole -mibefradil -voriconazole This medicine may also interact with the following medications: -cyclosporine -erythromycin -fluconazole -ketoconazole -medicines for HIV like delavirdine, efavirenz, nevirapine -medicines for seizures like ethotoin, fosphenotoin, phenytoin -medicines to increase blood counts like filgrastim, pegfilgrastim, sargramostim -other chemotherapy drugs like cisplatin, L-asparaginase, methotrexate, mitomycin, paclitaxel -pegaspargase -vaccines -zalcitabine, ddC Talk to your doctor or health care professional before taking any of these medicines: -acetaminophen -aspirin -ibuprofen -ketoprofen -naproxen This list may not describe all possible interactions. Give your health care provider a list of all the medicines, herbs, non-prescription drugs, or dietary supplements you use. Also tell them if you smoke, drink alcohol, or use illegal drugs. Some items may interact with your medicine. What should I watch for while using this medicine? Your condition will be monitored carefully while you are receiving this medicine. You will need important blood work done while you are taking this medicine. This drug may make you feel generally unwell. This is not uncommon, as chemotherapy can affect healthy cells as well as cancer cells. Report any side effects. Continue your course of treatment even though you feel ill unless your doctor tells you to stop. In some cases, you may be  given additional medicines to help with side effects. Follow all  directions for their use. Call your doctor or health care professional for advice if you get a fever, chills or sore throat, or other symptoms of a cold or flu. Do not treat yourself. Avoid taking products that contain aspirin, acetaminophen, ibuprofen, naproxen, or ketoprofen unless instructed by your doctor. These medicines may hide a fever. Do not become pregnant while taking this medicine. Women should inform their doctor if they wish to become pregnant or think they might be pregnant. There is a potential for serious side effects to an unborn child. Talk to your health care professional or pharmacist for more information. Do not breast-feed an infant while taking this medicine. Men may have a lower sperm count while taking this medicine. Talk to your doctor if you plan to father a child. What side effects may I notice from receiving this medicine? Side effects that you should report to your doctor or health care professional as soon as possible: -allergic reactions like skin rash, itching or hives, swelling of the face, lips, or tongue -breathing problems -confusion or changes in emotions or moods -constipation -cough -mouth sores -muscle weakness -nausea and vomiting -pain, swelling, redness or irritation at the injection site -pain, tingling, numbness in the hands or feet -problems with balance, talking, walking -seizures -stomach pain -trouble passing urine or change in the amount of urine Side effects that usually do not require medical attention (report to your doctor or health care professional if they continue or are bothersome): -diarrhea -hair loss -jaw pain -loss of appetite This list may not describe all possible side effects. Call your doctor for medical advice about side effects. You may report side effects to FDA at 1-800-FDA-1088. Where should I keep my medicine? This drug is given in a hospital or clinic and will not be stored at home. NOTE: This sheet is a summary.  It may not cover all possible information. If you have questions about this medicine, talk to your doctor, pharmacist, or health care provider.    (Cytoxan)  Cyclophosphamide injection What is this medicine? CYCLOPHOSPHAMIDE (sye kloe FOSS fa mide) is a chemotherapy drug. It slows the growth of cancer cells. This medicine is used to treat many types of cancer like lymphoma, myeloma, leukemia, breast cancer, and ovarian cancer, to name a few. This medicine may be used for other purposes; ask your health care provider or pharmacist if you have questions. What should I tell my health care provider before I take this medicine? They need to know if you have any of these conditions: -blood disorders -history of other chemotherapy -infection -kidney disease -liver disease -recent or ongoing radiation therapy -tumors in the bone marrow -an unusual or allergic reaction to cyclophosphamide, other chemotherapy, other medicines, foods, dyes, or preservatives -pregnant or trying to get pregnant -breast-feeding How should I use this medicine? This drug is usually given as an injection into a vein or muscle or by infusion into a vein. It is administered in a hospital or clinic by a specially trained health care professional. Talk to your pediatrician regarding the use of this medicine in children. Special care may be needed. Overdosage: If you think you have taken too much of this medicine contact a poison control center or emergency room at once. NOTE: This medicine is only for you. Do not share this medicine with others. What if I miss a dose? It is important not to miss your dose. Call your doctor or health  care professional if you are unable to keep an appointment. What may interact with this medicine? This medicine may interact with the following medications: -amiodarone -amphotericin B -azathioprine -certain antiviral medicines for HIV or AIDS such as protease inhibitors (e.g., indinavir,  ritonavir) and zidovudine -certain blood pressure medications such as benazepril, captopril, enalapril, fosinopril, lisinopril, moexipril, monopril, perindopril, quinapril, ramipril, trandolapril -certain cancer medications such as anthracyclines (e.g., daunorubicin, doxorubicin), busulfan, cytarabine, paclitaxel, pentostatin, tamoxifen, trastuzumab -certain diuretics such as chlorothiazide, chlorthalidone, hydrochlorothiazide, indapamide, metolazone -certain medicines that treat or prevent blood clots like warfarin -certain muscle relaxants such as succinylcholine -cyclosporine -etanercept -indomethacin -medicines to increase blood counts like filgrastim, pegfilgrastim, sargramostim -medicines used as general anesthesia -metronidazole -natalizumab This list may not describe all possible interactions. Give your health care provider a list of all the medicines, herbs, non-prescription drugs, or dietary supplements you use. Also tell them if you smoke, drink alcohol, or use illegal drugs. Some items may interact with your medicine. What should I watch for while using this medicine? Visit your doctor for checks on your progress. This drug may make you feel generally unwell. This is not uncommon, as chemotherapy can affect healthy cells as well as cancer cells. Report any side effects. Continue your course of treatment even though you feel ill unless your doctor tells you to stop. Drink water or other fluids as directed. Urinate often, even at night. In some cases, you may be given additional medicines to help with side effects. Follow all directions for their use. Call your doctor or health care professional for advice if you get a fever, chills or sore throat, or other symptoms of a cold or flu. Do not treat yourself. This drug decreases your body's ability to fight infections. Try to avoid being around people who are sick. This medicine may increase your risk to bruise or bleed. Call your doctor or  health care professional if you notice any unusual bleeding. Be careful brushing and flossing your teeth or using a toothpick because you may get an infection or bleed more easily. If you have any dental work done, tell your dentist you are receiving this medicine. You may get drowsy or dizzy. Do not drive, use machinery, or do anything that needs mental alertness until you know how this medicine affects you. Do not become pregnant while taking this medicine or for 1 year after stopping it. Women should inform their doctor if they wish to become pregnant or think they might be pregnant. Men should not father a child while taking this medicine and for 4 months after stopping it. There is a potential for serious side effects to an unborn child. Talk to your health care professional or pharmacist for more information. Do not breast-feed an infant while taking this medicine. This medicine may interfere with the ability to have a child. This medicine has caused ovarian failure in some women. This medicine has caused reduced sperm counts in some men. You should talk with your doctor or health care professional if you are concerned about your fertility. If you are going to have surgery, tell your doctor or health care professional that you have taken this medicine. What side effects may I notice from receiving this medicine? Side effects that you should report to your doctor or health care professional as soon as possible: -allergic reactions like skin rash, itching or hives, swelling of the face, lips, or tongue -low blood counts - this medicine may decrease the number of white blood cells, red blood  cells and platelets. You may be at increased risk for infections and bleeding. -signs of infection - fever or chills, cough, sore throat, pain or difficulty passing urine -signs of decreased platelets or bleeding - bruising, pinpoint red spots on the skin, black, tarry stools, blood in the urine -signs of  decreased red blood cells - unusually weak or tired, fainting spells, lightheadedness -breathing problems -dark urine -dizziness -palpitations -swelling of the ankles, feet, hands -trouble passing urine or change in the amount of urine -weight gain -yellowing of the eyes or skin Side effects that usually do not require medical attention (report to your doctor or health care professional if they continue or are bothersome): -changes in nail or skin color -hair loss -missed menstrual periods -mouth sores -nausea, vomiting This list may not describe all possible side effects. Call your doctor for medical advice about side effects. You may report side effects to FDA at 1-800-FDA-1088. Where should I keep my medicine? This drug is given in a hospital or clinic and will not be stored at home. NOTE: This sheet is a summary. It may not cover all possible information. If you have questions about this medicine, talk to your doctor, pharmacist, or health care provider.   Rituximab injection What is this medicine? RITUXIMAB (ri TUX i mab) is a monoclonal antibody. It is used commonly to treat non-Hodgkin lymphoma and other conditions. It is also used to treat rheumatoid arthritis (RA). In RA, this medicine slows the inflammatory process and help reduce joint pain and swelling. This medicine is often used with other cancer or arthritis medications. This medicine may be used for other purposes; ask your health care provider or pharmacist if you have questions. What should I tell my health care provider before I take this medicine? They need to know if you have any of these conditions: -blood disorders -heart disease -history of hepatitis B -infection (especially a virus infection such as chickenpox, cold sores, or herpes) -irregular heartbeat -kidney disease -lung or breathing disease, like asthma -lupus -an unusual or allergic reaction to rituximab, mouse proteins, other medicines, foods, dyes,  or preservatives -pregnant or trying to get pregnant -breast-feeding How should I use this medicine? This medicine is for infusion into a vein. It is administered in a hospital or clinic by a specially trained health care professional. A special MedGuide will be given to you by the pharmacist with each prescription and refill. Be sure to read this information carefully each time. Talk to your pediatrician regarding the use of this medicine in children. This medicine is not approved for use in children. Overdosage: If you think you have taken too much of this medicine contact a poison control center or emergency room at once. NOTE: This medicine is only for you. Do not share this medicine with others. What if I miss a dose? It is important not to miss a dose. Call your doctor or health care professional if you are unable to keep an appointment. What may interact with this medicine? -cisplatin -medicines for blood pressure -some other medicines for arthritis -vaccines This list may not describe all possible interactions. Give your health care provider a list of all the medicines, herbs, non-prescription drugs, or dietary supplements you use. Also tell them if you smoke, drink alcohol, or use illegal drugs. Some items may interact with your medicine. What should I watch for while using this medicine? Report any side effects that you notice during your treatment right away, such as changes in your breathing,  fever, chills, dizziness or lightheadedness. These effects are more common with the first dose. Visit your prescriber or health care professional for checks on your progress. You will need to have regular blood work. Report any other side effects. The side effects of this medicine can continue after you finish your treatment. Continue your course of treatment even though you feel ill unless your doctor tells you to stop. Call your doctor or health care professional for advice if you get a fever,  chills or sore throat, or other symptoms of a cold or flu. Do not treat yourself. This drug decreases your body's ability to fight infections. Try to avoid being around people who are sick. This medicine may increase your risk to bruise or bleed. Call your doctor or health care professional if you notice any unusual bleeding. Be careful brushing and flossing your teeth or using a toothpick because you may get an infection or bleed more easily. If you have any dental work done, tell your dentist you are receiving this medicine. Avoid taking products that contain aspirin, acetaminophen, ibuprofen, naproxen, or ketoprofen unless instructed by your doctor. These medicines may hide a fever. Do not become pregnant while taking this medicine. Women should inform their doctor if they wish to become pregnant or think they might be pregnant. There is a potential for serious side effects to an unborn child. Talk to your health care professional or pharmacist for more information. Do not breast-feed an infant while taking this medicine. What side effects may I notice from receiving this medicine? Side effects that you should report to your doctor or health care professional as soon as possible: -allergic reactions like skin rash, itching or hives, swelling of the face, lips, or tongue -low blood counts - this medicine may decrease the number of white blood cells, red blood cells and platelets. You may be at increased risk for infections and bleeding. -signs of infection - fever or chills, cough, sore throat, pain or difficulty passing urine -signs of decreased platelets or bleeding - bruising, pinpoint red spots on the skin, black, tarry stools, blood in the urine -signs of decreased red blood cells - unusually weak or tired, fainting spells, lightheadedness -breathing problems -confused, not responsive -chest pain -fast, irregular heartbeat -feeling faint or lightheaded, falls -mouth sores -redness,  blistering, peeling or loosening of the skin, including inside the mouth -stomach pain -swelling of the ankles, feet, or hands -trouble passing urine or change in the amount of urine Side effects that usually do not require medical attention (report to your doctor or other health care professional if they continue or are bothersome): -anxiety -headache -loss of appetite -muscle aches -nausea -night sweats This list may not describe all possible side effects. Call your doctor for medical advice about side effects. You may report side effects to FDA at 1-800-FDA-1088. Where should I keep my medicine? This drug is given in a hospital or clinic and will not be stored at home. NOTE: This sheet is a summary. It may not cover all possible information. If you have questions about this medicine, talk to your doctor, pharmacist, or health care provider.    2016, [employer]vier/Gold Standard. (2014-05-04 22:30:56)

## 2015-11-20 ENCOUNTER — Encounter: Payer: Self-pay | Admitting: *Deleted

## 2015-11-20 ENCOUNTER — Telehealth: Payer: Self-pay | Admitting: *Deleted

## 2015-11-20 NOTE — Telephone Encounter (Signed)
-----   Message from Ma Hillock, RN sent at 11/17/2015  4:20 PM EDT ----- Regarding: [name]: 1st Chemo F/U ContactNV:[phone] Patient received 1st dose of adriamycin, vincristine, cytoxan and rituxan (RCHOP) on Friday 9/8. He did not have any immediate complications from infusion. Please call patient to follow up on Monday.

## 2015-11-20 NOTE — Telephone Encounter (Signed)
Called patient for chemo follow up. States he is doing well. Slight nausea relieved with antiemetic. Eating and drinking well. Will call if has any problems

## 2015-11-21 LAB — PROTEIN ELECTROPHORESIS, SERUM
A/G Ratio: 0.9 (ref 0.7–1.7)
ALPHA 1: 0.3 g/dL (ref 0.0–0.4)
ALPHA 2: 0.8 g/dL (ref 0.4–1.0)
Albumin: 3.6 g/dL (ref 2.9–4.4)
BETA: 1.2 g/dL (ref 0.7–1.3)
GAMMA GLOBULIN: 2 g/dL — AB (ref 0.4–1.8)
Globulin, Total: 4.2 g/dL — ABNORMAL HIGH (ref 2.2–3.9)
M-SPIKE, %: 1.5 g/dL — AB
Total Protein: 7.8 g/dL (ref 6.0–8.5)

## 2015-11-22 ENCOUNTER — Telehealth: Payer: Self-pay | Admitting: *Deleted

## 2015-11-22 NOTE — Telephone Encounter (Signed)
For patient that Dr Reita Chard will review SPEP results with him on 9/29 with next treatment.  Msg to scheduler to make appt

## 2015-11-24 ENCOUNTER — Other Ambulatory Visit: Payer: BLUE CROSS/BLUE SHIELD

## 2015-11-24 ENCOUNTER — Telehealth: Payer: Self-pay

## 2015-11-24 ENCOUNTER — Other Ambulatory Visit (HOSPITAL_BASED_OUTPATIENT_CLINIC_OR_DEPARTMENT_OTHER): Payer: BLUE CROSS/BLUE SHIELD

## 2015-11-24 ENCOUNTER — Ambulatory Visit (HOSPITAL_BASED_OUTPATIENT_CLINIC_OR_DEPARTMENT_OTHER): Payer: BLUE CROSS/BLUE SHIELD | Admitting: Hematology and Oncology

## 2015-11-24 DIAGNOSIS — D701 Agranulocytosis secondary to cancer chemotherapy: Secondary | ICD-10-CM | POA: Diagnosis not present

## 2015-11-24 DIAGNOSIS — R5383 Other fatigue: Secondary | ICD-10-CM | POA: Diagnosis not present

## 2015-11-24 DIAGNOSIS — C8339 Diffuse large B-cell lymphoma, extranodal and solid organ sites: Secondary | ICD-10-CM

## 2015-11-24 DIAGNOSIS — R110 Nausea: Secondary | ICD-10-CM | POA: Diagnosis not present

## 2015-11-24 LAB — COMPREHENSIVE METABOLIC PANEL
ALT: 21 U/L (ref 0–55)
AST: 14 U/L (ref 5–34)
Albumin: 3.1 g/dL — ABNORMAL LOW (ref 3.5–5.0)
Alkaline Phosphatase: 85 U/L (ref 40–150)
Anion Gap: 8 mEq/L (ref 3–11)
BILIRUBIN TOTAL: 0.94 mg/dL (ref 0.20–1.20)
BUN: 15.2 mg/dL (ref 7.0–26.0)
CO2: 26 meq/L (ref 22–29)
CREATININE: 0.8 mg/dL (ref 0.7–1.3)
Calcium: 9 mg/dL (ref 8.4–10.4)
Chloride: 104 mEq/L (ref 98–109)
EGFR: >90 mL/min/{1.73_m2} (ref >90)
GLUCOSE: 133 mg/dL (ref 70–140)
Potassium: 3.8 mEq/L (ref 3.5–5.1)
SODIUM: 138 meq/L (ref 136–145)
TOTAL PROTEIN: 7.4 g/dL (ref 6.4–8.3)

## 2015-11-24 LAB — CBC WITH DIFFERENTIAL/PLATELET
BASO%: 1 % (ref 0.0–2.0)
BASOS ABS: 0 10*3/uL (ref 0.0–0.1)
EOS ABS: 0.1 10*3/uL (ref 0.0–0.5)
EOS%: 10.7 % — AB (ref 0.0–7.0)
HCT: 34 % — ABNORMAL LOW (ref 38.4–49.9)
HEMOGLOBIN: 11.7 g/dL — AB (ref 13.0–17.1)
LYMPH%: 64.1 % — AB (ref 14.0–49.0)
MCH: 28.2 pg (ref 27.2–33.4)
MCHC: 34.4 g/dL (ref 32.0–36.0)
MCV: 81.9 fL (ref 79.3–98.0)
MONO#: 0 10*3/uL — ABNORMAL LOW (ref 0.1–0.9)
MONO%: 3.9 % (ref 0.0–14.0)
NEUT#: 0.2 10*3/uL — CL (ref 1.5–6.5)
NEUT%: 20.3 % — ABNORMAL LOW (ref 39.0–75.0)
NRBC: 0 % (ref 0–0)
Platelets: 106 10*3/uL — ABNORMAL LOW (ref 140–400)
RBC: 4.15 10*6/uL — AB (ref 4.20–5.82)
RDW: 13.9 % (ref 11.0–14.6)
WBC: 1 10*3/uL — ABNORMAL LOW (ref 4.0–10.3)
lymph#: 0.7 10*3/uL — ABNORMAL LOW (ref 0.9–3.3)

## 2015-11-24 NOTE — Telephone Encounter (Signed)
Call received from patient wife Manuela Schwartz re: appt for lab results on the 29th.  Chart reviewed and OV appt made with Dr. Lindi Adie for the 29th.  On chart review - this RN found that patient did not have nadir appointments.  Appt made for OV and lab for today.  Pt voiced understanding.  Writer confirmed lab orders in.  Lab notified of add on appt.

## 2015-11-27 ENCOUNTER — Encounter: Payer: Self-pay | Admitting: Hematology and Oncology

## 2015-11-27 NOTE — Assessment & Plan Note (Signed)
Small intestine resection, proximal ileum08/10/2015: Involvement by diffuse large B-cell lymphoma,. Biopsy of peritoneal implant: Invol by a DLBCL.positive for CD20, CD79a,CD10, bcl-6, and bcl-2. CD3 and CD5 highlight scattered admixed T-cells. CD34 and TdT neg  PET/CT 11/02/2015: Nodular thickening within the mesenteries of small bowel 17 mm concerning for peritoneal implant of high-grade lymphoma, activity along the abdominal wound with inflammation suspicion for infection/abscess/fistula of bladder Testicular ultrasound: Normal Bone marrow biopsy 10/29/2015: Hypercellular bone marrow for age 32-60% with trilineage hematopoiesis, plasma cells 9% with Kappa Light chain excess  Lab review: LDH 184; hepatitis B, C, HIV testing negative IgG: 1703 Iron studies: 10% saturation, hemoglobin 12.3  Prognosis: Revised IPI score: 1 (4 year disease free survival rate: 80%, estimated overall survival 79%) If I uses age of less than or equal to 60, revised IPI score 38, 68-year-old DFS 94%, estimated overall survival 94%  Recommendation: R-CHOP chemotherapy 6 cycles  Abdominal incision infection: Patient has seen urology and Dr. Rosendo Gros. Ciystogram did not reveal a fistula.

## 2015-11-27 NOTE — Progress Notes (Signed)
Patient Care Team: Edward Common, MD as PCP - General (Internal Medicine)  DIAGNOSIS: No matching staging information was found for the patient.  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 Biopsy of peritoneal implant: Invol by a DLBCL.positive for CD20, CD79a,CD10, bcl-6, and bcl-2. CD3 and CD5 highlight scattered admixed T-cells. CD34 and TdT neg      10/31/2015 Pathology Results    Bone marrow biopsy: Hypercellular bone marrow for age 61-60% with trilineage hematopoiesis, plasma cells 9% with Kappa Light chain excess      11/02/2015 PET scan    Nodular thickening within the mesenteries of small bowel 17 mm concerning for peritoneal implant of high-grade lymphoma, activity along the abdominal wound with inflammation suspicion for infection/abscess/fistula of bladder      11/02/2015 Imaging    Ultrasound scrotum: Normal testes, no epididymitis, no varicocele or hydrocele      11/17/2015 -  Chemotherapy    R CHOP 6 cycles       CHIEF COMPLIANT: Cycle 1 day 8 R CHOP chemotherapy  INTERVAL HISTORY: [name] is a 60 year old with above-mentioned history of 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 He reports that he felt nauseated and also complained of insomnia. He attributes this to prednisone that yet to take for 5 days. Soon after the prednisone was completed, he had profound loss of energy. He still feels extremely weak and tired today. He denies any fevers or chills.  REVIEW OF SYSTEMS:   Constitutional: Denies fevers, chills or abnormal weight loss, severe fatigue Eyes: Denies blurriness of vision Ears, nose, mouth, throat, and face: Denies  mucositis or sore throat Respiratory: Denies cough, dyspnea or wheezes Cardiovascular: Denies palpitation, chest discomfort Gastrointestinal:  Denies nausea, heartburn or change in bowel habits Skin: Denies abnormal skin rashes Lymphatics: Denies new lymphadenopathy or easy bruising Neurological:Denies numbness, tingling or new weaknesses Behavioral/Psych: Mood is stable, no new changes , insomnia Extremities: No lower extremity edema  All other systems were reviewed with the patient and are negative.  I have reviewed the past medical history, past surgical history, social history and family history with the patient and they are unchanged from previous note.  ALLERGIES:  is allergic to penicillin g.  MEDICATIONS:  Current Outpatient Prescriptions  Medication Sig Dispense Refill  . albuterol (PROVENTIL HFA;VENTOLIN HFA) 108 (90 Base) MCG/ACT inhaler Inhale 2 puffs into the lungs every 6 (six) hours as needed for wheezing or shortness of breath.     . allopurinol (ZYLOPRIM) 300 MG tablet Take 1 tablet (300 mg total) by mouth daily. (Patient not taking: Reported on 11/10/2015) 30 tablet 3  . beclomethasone (QVAR) 40 MCG/ACT inhaler Inhale 1 puff into the lungs daily.    . clonazePAM (KLONOPIN) 1 MG tablet Take 1 mg by mouth 2 (two) times daily.    Marland Kitchen DEXILANT 60 MG capsule Take 60 mg by mouth daily.    Marland Kitchen FLUoxetine (PROZAC) 20 MG tablet Take 40 mg by mouth 2 (two) times daily.    Marland Kitchen gabapentin (NEURONTIN) 300 MG capsule Take 300 mg by mouth at bedtime as needed (nerve pain).     Marland Kitchen lidocaine-prilocaine (EMLA) cream Apply to affected area once 30 g  3  . LORazepam (ATIVAN) 0.5 MG tablet Take 1 tablet (0.5 mg total) by mouth every 6 (six) hours as needed (Nausea or vomiting). 30 tablet 0  . ondansetron (ZOFRAN) 8 MG tablet Take 1 tablet (8 mg total) by mouth every 8 (eight) hours as needed for refractory nausea / vomiting. 30 tablet 1  . oxyCODONE-acetaminophen (ROXICET) 5-325 MG tablet Take 1-2  tablets by mouth every 4 (four) hours as needed. 30 tablet 0  . predniSONE (DELTASONE) 20 MG tablet Take 3 tablets (60 mg total) by mouth daily. Take on days 1-5 of chemotherapy. 90 tablet 0  . prochlorperazine (COMPAZINE) 10 MG tablet Take 1 tablet (10 mg total) by mouth every 6 (six) hours as needed (Nausea or vomiting). 30 tablet 6  . rosuvastatin (CRESTOR) 5 MG tablet Take 5 mg by mouth daily.     No current facility-administered medications for this visit.     PHYSICAL EXAMINATION: ECOG PERFORMANCE STATUS: 2 - Symptomatic, <50% confined to bed  Vitals:   11/24/15 1138  BP: 104/73  Pulse: 93  Resp: 18  Temp: 98.2 F (36.8 C)   Filed Weights   11/24/15 1138  Weight: 182 lb 9.6 oz (82.8 kg)    GENERAL:alert, no distress and comfortable SKIN: skin color, texture, turgor are normal, no rashes or significant lesions EYES: normal, Conjunctiva are pink and non-injected, sclera clear OROPHARYNX:no exudate, no erythema and lips, buccal mucosa, and tongue normal  NECK: supple, thyroid normal size, non-tender, without nodularity LYMPH:  no palpable lymphadenopathy in the cervical, axillary or inguinal LUNGS: clear to auscultation and percussion with normal breathing effort HEART: regular rate & rhythm and no murmurs and no lower extremity edema ABDOMEN:abdomen soft, non-tender and normal bowel sounds MUSCULOSKELETAL:no cyanosis of digits and no clubbing  NEURO: alert & oriented x 3 with fluent speech, no focal motor/sensory deficits EXTREMITIES: No lower extremity edema  LABORATORY DATA:  I have reviewed the data as listed   Chemistry      Component Value Date/Time   NA 138 11/24/2015 1118   K 3.8 11/24/2015 1118   CL 105 10/18/2015 0444   CO2 26 11/24/2015 1118   BUN 15.2 11/24/2015 1118   CREATININE 0.8 11/24/2015 1118      Component Value Date/Time   CALCIUM 9.0 11/24/2015 1118   ALKPHOS 85 11/24/2015 1118   AST 14 11/24/2015 1118   ALT 21 11/24/2015 1118   BILITOT  0.94 11/24/2015 1118       Lab Results  Component Value Date   WBC 1.0 (L) 11/24/2015   HGB 11.7 (L) 11/24/2015   HCT 34.0 (L) 11/24/2015   MCV 81.9 11/24/2015   PLT 106 (L) 11/24/2015   NEUTROABS 0.2 (LL) 11/24/2015     ASSESSMENT & PLAN:  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 Biopsy of peritoneal implant: Invol by a DLBCL.positive for CD20, CD79a,CD10, bcl-6, and bcl-2. CD3 and CD5 highlight scattered admixed T-cells. CD34 and TdT neg  PET/CT 11/02/2015: Nodular thickening within the mesenteries of small bowel 17 mm concerning for peritoneal implant of high-grade lymphoma, activity along the abdominal wound with inflammation suspicion for infection/abscess/fistula of bladder Testicular ultrasound: Normal Bone marrow biopsy 10/29/2015: Hypercellular bone marrow for age 13-60% with trilineage hematopoiesis, plasma cells 9% with Kappa Light chain excess  Lab review: LDH 184; hepatitis B, C, HIV testing negative IgG: 1703 Iron studies: 10% saturation, hemoglobin 12.3  Prognosis: Revised IPI score:  1 (4 year disease free survival rate: 80%, estimated overall survival 79%) If I uses age of less than or equal to 60, revised IPI score 77, 61-year-old DFS 94%, estimated overall survival 94%  Current treatment: R-CHOP chemotherapy 6 cycles started 11/17/2015  Chemotherapy toxicities: 1. Profound neutropenia grade 3: encouraged him to wear a mask and instructed him on neutropenic precautions 2. Severe fatigue: Grade 2-3 3. Nausea grade 1   Abdominal incision infection: Patient has seen urology and Dr. Rosendo Gros. Ciystogram did not reveal a fistula. It is healing very slowly  Return to clinic in 2 weeks for cycle 2   No orders of the defined types were placed in this encounter.  The patient has a good understanding of the overall plan. he agrees with it. he will  call with any problems that may develop before the next visit here.   Rulon Eisenmenger, MD 11/27/15

## 2015-12-08 ENCOUNTER — Encounter: Payer: Self-pay | Admitting: Hematology and Oncology

## 2015-12-08 ENCOUNTER — Ambulatory Visit (HOSPITAL_BASED_OUTPATIENT_CLINIC_OR_DEPARTMENT_OTHER): Payer: BLUE CROSS/BLUE SHIELD

## 2015-12-08 ENCOUNTER — Ambulatory Visit (HOSPITAL_BASED_OUTPATIENT_CLINIC_OR_DEPARTMENT_OTHER): Payer: BLUE CROSS/BLUE SHIELD | Admitting: Hematology and Oncology

## 2015-12-08 ENCOUNTER — Other Ambulatory Visit (HOSPITAL_BASED_OUTPATIENT_CLINIC_OR_DEPARTMENT_OTHER): Payer: BLUE CROSS/BLUE SHIELD

## 2015-12-08 VITALS — BP 104/65 | HR 79 | Temp 98.1°F | Resp 18

## 2015-12-08 VITALS — BP 121/74 | HR 77 | Temp 97.6°F | Resp 18 | Wt 187.6 lb

## 2015-12-08 DIAGNOSIS — Z5111 Encounter for antineoplastic chemotherapy: Secondary | ICD-10-CM | POA: Diagnosis not present

## 2015-12-08 DIAGNOSIS — C8339 Diffuse large B-cell lymphoma, extranodal and solid organ sites: Secondary | ICD-10-CM

## 2015-12-08 DIAGNOSIS — D472 Monoclonal gammopathy: Secondary | ICD-10-CM | POA: Diagnosis not present

## 2015-12-08 DIAGNOSIS — R5383 Other fatigue: Secondary | ICD-10-CM

## 2015-12-08 DIAGNOSIS — D701 Agranulocytosis secondary to cancer chemotherapy: Secondary | ICD-10-CM

## 2015-12-08 DIAGNOSIS — R110 Nausea: Secondary | ICD-10-CM

## 2015-12-08 DIAGNOSIS — Z5112 Encounter for antineoplastic immunotherapy: Secondary | ICD-10-CM | POA: Diagnosis not present

## 2015-12-08 LAB — CBC WITH DIFFERENTIAL/PLATELET
BASO%: 0.3 % (ref 0.0–2.0)
BASOS ABS: 0 10*3/uL (ref 0.0–0.1)
EOS ABS: 0 10*3/uL (ref 0.0–0.5)
EOS%: 0.3 % (ref 0.0–7.0)
HEMATOCRIT: 35.4 % — AB (ref 38.4–49.9)
HEMOGLOBIN: 11.9 g/dL — AB (ref 13.0–17.1)
LYMPH#: 1.5 10*3/uL (ref 0.9–3.3)
LYMPH%: 22.7 % (ref 14.0–49.0)
MCH: 28.2 pg (ref 27.2–33.4)
MCHC: 33.6 g/dL (ref 32.0–36.0)
MCV: 83.9 fL (ref 79.3–98.0)
MONO#: 0.7 10*3/uL (ref 0.1–0.9)
MONO%: 11.6 % (ref 0.0–14.0)
NEUT#: 4.2 10*3/uL (ref 1.5–6.5)
NEUT%: 65.1 % (ref 39.0–75.0)
Platelets: 293 10*3/uL (ref 140–400)
RBC: 4.22 10*6/uL (ref 4.20–5.82)
RDW: 15.5 % — AB (ref 11.0–14.6)
WBC: 6.4 10*3/uL (ref 4.0–10.3)

## 2015-12-08 LAB — COMPREHENSIVE METABOLIC PANEL
ALBUMIN: 3.3 g/dL — AB (ref 3.5–5.0)
ALK PHOS: 93 U/L (ref 40–150)
ALT: 28 U/L (ref 0–55)
AST: 24 U/L (ref 5–34)
Anion Gap: 10 mEq/L (ref 3–11)
BUN: 17.2 mg/dL (ref 7.0–26.0)
CALCIUM: 9.3 mg/dL (ref 8.4–10.4)
CHLORIDE: 104 meq/L (ref 98–109)
CO2: 25 mEq/L (ref 22–29)
Creatinine: 0.9 mg/dL (ref 0.7–1.3)
Glucose: 94 mg/dl (ref 70–140)
POTASSIUM: 4.2 meq/L (ref 3.5–5.1)
SODIUM: 139 meq/L (ref 136–145)
Total Bilirubin: 0.33 mg/dL (ref 0.20–1.20)
Total Protein: 7.8 g/dL (ref 6.4–8.3)

## 2015-12-08 MED ORDER — DEXAMETHASONE SODIUM PHOSPHATE 100 MG/10ML IJ SOLN
10.0000 mg | Freq: Once | INTRAMUSCULAR | Status: AC
  Administered 2015-12-08: 10 mg via INTRAVENOUS
  Filled 2015-12-08: qty 1

## 2015-12-08 MED ORDER — SODIUM CHLORIDE 0.9 % IV SOLN: 375.0000 mg/m2 | Freq: Once | INTRAVENOUS | Status: DC

## 2015-12-08 MED ORDER — HEPARIN SOD (PORK) LOCK FLUSH 100 UNIT/ML IV SOLN
500.0000 [IU] | Freq: Once | INTRAVENOUS | Status: AC | PRN
  Administered 2015-12-08: 500 [IU]
  Filled 2015-12-08: qty 5

## 2015-12-08 MED ORDER — SODIUM CHLORIDE 0.9% FLUSH
10.0000 mL | INTRAVENOUS | Status: DC | PRN
  Administered 2015-12-08: 10 mL
  Filled 2015-12-08: qty 10

## 2015-12-08 MED ORDER — DIPHENHYDRAMINE HCL 25 MG PO CAPS
ORAL_CAPSULE | ORAL | Status: AC
  Filled 2015-12-08: qty 2

## 2015-12-08 MED ORDER — ACETAMINOPHEN 325 MG PO TABS
ORAL_TABLET | ORAL | Status: AC
  Filled 2015-12-08: qty 2

## 2015-12-08 MED ORDER — DIPHENHYDRAMINE HCL 25 MG PO CAPS
50.0000 mg | ORAL_CAPSULE | Freq: Once | ORAL | Status: AC
  Administered 2015-12-08: 50 mg via ORAL

## 2015-12-08 MED ORDER — SODIUM CHLORIDE 0.9 % IV SOLN
375.0000 mg/m2 | Freq: Once | INTRAVENOUS | Status: AC
  Administered 2015-12-08: 800 mg via INTRAVENOUS
  Filled 2015-12-08: qty 50

## 2015-12-08 MED ORDER — DOXORUBICIN HCL CHEMO IV INJECTION 2 MG/ML
45.0000 mg/m2 | Freq: Once | INTRAVENOUS | Status: AC
  Administered 2015-12-08: 92 mg via INTRAVENOUS
  Filled 2015-12-08: qty 46

## 2015-12-08 MED ORDER — VINCRISTINE SULFATE CHEMO INJECTION 1 MG/ML
2.0000 mg | Freq: Once | INTRAVENOUS | Status: AC
  Administered 2015-12-08: 2 mg via INTRAVENOUS
  Filled 2015-12-08: qty 2

## 2015-12-08 MED ORDER — PEGFILGRASTIM 6 MG/0.6ML ~~LOC~~ PSKT
6.0000 mg | PREFILLED_SYRINGE | Freq: Once | SUBCUTANEOUS | Status: AC
  Administered 2015-12-08: 6 mg via SUBCUTANEOUS
  Filled 2015-12-08: qty 0.6

## 2015-12-08 MED ORDER — ACETAMINOPHEN 325 MG PO TABS
650.0000 mg | ORAL_TABLET | Freq: Once | ORAL | Status: AC
  Administered 2015-12-08: 650 mg via ORAL

## 2015-12-08 MED ORDER — PALONOSETRON HCL INJECTION 0.25 MG/5ML
INTRAVENOUS | Status: AC
  Filled 2015-12-08: qty 5

## 2015-12-08 MED ORDER — ZOLPIDEM TARTRATE 10 MG PO TABS: 10.0000 mg | ORAL_TABLET | Freq: Every evening | ORAL | 0 refills | Status: DC | PRN

## 2015-12-08 MED ORDER — SODIUM CHLORIDE 0.9 % IV SOLN
Freq: Once | INTRAVENOUS | Status: AC
  Administered 2015-12-08: 11:00:00 via INTRAVENOUS

## 2015-12-08 MED ORDER — PALONOSETRON HCL INJECTION 0.25 MG/5ML
0.2500 mg | Freq: Once | INTRAVENOUS | Status: AC
  Administered 2015-12-08: 0.25 mg via INTRAVENOUS

## 2015-12-08 MED ORDER — SODIUM CHLORIDE 0.9 % IV SOLN
650.0000 mg/m2 | Freq: Once | INTRAVENOUS | Status: AC
  Administered 2015-12-08: 1320 mg via INTRAVENOUS
  Filled 2015-12-08: qty 66

## 2015-12-08 NOTE — Progress Notes (Signed)
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 Biopsy of peritoneal implant: Invol by a DLBCL.positive for CD20, CD79a,CD10, bcl-6, and bcl-2. CD3 and CD5 highlight scattered admixed T-cells. CD34 and TdT neg      10/31/2015 Pathology Results    Bone marrow biopsy: Hypercellular bone marrow for age 54-60% with trilineage hematopoiesis, plasma cells 9% with Kappa Light chain excess      11/02/2015 PET scan    Nodular thickening within the mesenteries of small bowel 17 mm concerning for peritoneal implant of high-grade lymphoma, activity along the abdominal wound with inflammation suspicion for infection/abscess/fistula of bladder      11/02/2015 Imaging    Ultrasound scrotum: Normal testes, no epididymitis, no varicocele or hydrocele      11/17/2015 -  Chemotherapy    R CHOP 6 cycles       CHIEF COMPLIANT: Cycle 2 R CHOP  INTERVAL HISTORY: Edward Steele is a 60 year old with above-mentioned history of diffuse large B-cell lymphoma involving the small intestines. He underwent resection and follow-up revealed still a small nodule in the mesentery. He has had problems since surgery with nonhealing of his abdominal incision. There was initially concern for an abscess or fistula from the bladder but that was not felt to be the case on cystoscopy grams. He received first cycle of chemotherapy with R CHOP and tolerated it fairly well. He is here today to receive cycle 2 of treatment. Incidentally he was found to have elevated monoclonal protein and 9% plasma cells in the bone marrow. He is having significant trouble with difficulty with sleeping. Ativan does not appear to be helping.  REVIEW OF SYSTEMS:   Constitutional: Denies fevers, chills or abnormal  weight loss Eyes: Denies blurriness of vision Ears, nose, mouth, throat, and face: Denies mucositis or sore throat Respiratory: Denies cough, dyspnea or wheezes Cardiovascular: Denies palpitation, chest discomfort Gastrointestinal:  Denies nausea, heartburn or change in bowel habits Skin: Denies abnormal skin rashes Lymphatics: Denies new lymphadenopathy or easy bruising Neurological:Denies numbness, tingling or new weaknesses Behavioral/Psych: Lack of sleep Extremities: No lower extremity edema  All other systems were reviewed with the patient and are negative.  I have reviewed the past medical history, past surgical history, social history and family history with the patient and they are unchanged from previous note.  ALLERGIES:  is allergic to penicillin g.  MEDICATIONS:  Current Outpatient Prescriptions  Medication Sig Dispense Refill  . albuterol (PROVENTIL HFA;VENTOLIN HFA) 108 (90 Base) MCG/ACT inhaler Inhale 2 puffs into the lungs every 6 (six) hours as needed for wheezing or shortness of breath.     . allopurinol (ZYLOPRIM) 300 MG tablet Take 1 tablet (300 mg total) by mouth daily. (Patient not taking: Reported on 11/10/2015) 30 tablet 3  . beclomethasone (QVAR) 40 MCG/ACT inhaler Inhale 1 puff into the lungs daily.    . clonazePAM (KLONOPIN) 1 MG tablet Take 1 mg by mouth 2 (two) times daily.    Marland Kitchen DEXILANT 60 MG capsule Take 60 mg by mouth daily.    Marland Kitchen FLUoxetine (PROZAC) 20 MG tablet Take 40 mg by mouth 2 (two) times daily.    Marland Kitchen gabapentin (NEURONTIN) 300 MG capsule Take 300 mg by mouth at bedtime as needed (nerve pain).     Marland Kitchen lidocaine-prilocaine (  EMLA) cream Apply to affected area once 30 g 3  . ondansetron (ZOFRAN) 8 MG tablet Take 1 tablet (8 mg total) by mouth every 8 (eight) hours as needed for refractory nausea / vomiting. 30 tablet 1  . oxyCODONE-acetaminophen (ROXICET) 5-325 MG tablet Take 1-2 tablets by mouth every 4 (four) hours as needed. 30 tablet 0  . predniSONE  (DELTASONE) 20 MG tablet Take 3 tablets (60 mg total) by mouth daily. Take on days 1-5 of chemotherapy. 90 tablet 0  . prochlorperazine (COMPAZINE) 10 MG tablet Take 1 tablet (10 mg total) by mouth every 6 (six) hours as needed (Nausea or vomiting). 30 tablet 6  . rosuvastatin (CRESTOR) 5 MG tablet Take 5 mg by mouth daily.    Marland Kitchen zolpidem (AMBIEN) 10 MG tablet Take 1 tablet (10 mg total) by mouth at bedtime as needed for sleep. 30 tablet 0   No current facility-administered medications for this visit.     PHYSICAL EXAMINATION: ECOG PERFORMANCE STATUS: 1 - Symptomatic but completely ambulatory  Vitals:   12/08/15 0913  BP: 121/74  Pulse: 77  Resp: 18  Temp: 97.6 F (36.4 C)   Filed Weights   12/08/15 0913  Weight: 187 lb 9.6 oz (85.1 kg)    GENERAL:alert, no distress and comfortable SKIN: skin color, texture, turgor are normal, no rashes or significant lesions EYES: normal, Conjunctiva are pink and non-injected, sclera clear OROPHARYNX:no exudate, no erythema and lips, buccal mucosa, and tongue normal  NECK: supple, thyroid normal size, non-tender, without nodularity LYMPH:  no palpable lymphadenopathy in the cervical, axillary or inguinal LUNGS: clear to auscultation and percussion with normal breathing effort HEART: regular rate & rhythm and no murmurs and no lower extremity edema ABDOMEN:abdomen soft, non-tender and normal bowel sounds MUSCULOSKELETAL:no cyanosis of digits and no clubbing  NEURO: alert & oriented x 3 with fluent speech, no focal motor/sensory deficits EXTREMITIES: No lower extremity edema  LABORATORY DATA:  I have reviewed the data as listed   Chemistry      Component Value Date/Time   NA 139 12/08/2015 0851   K 4.2 12/08/2015 0851   CL 105 10/18/2015 0444   CO2 25 12/08/2015 0851   BUN 17.2 12/08/2015 0851   CREATININE 0.9 12/08/2015 0851      Component Value Date/Time   CALCIUM 9.3 12/08/2015 0851   ALKPHOS 93 12/08/2015 0851   AST 24 12/08/2015  0851   ALT 28 12/08/2015 0851   BILITOT 0.33 12/08/2015 0851       Lab Results  Component Value Date   WBC 6.4 12/08/2015   HGB 11.9 (L) 12/08/2015   HCT 35.4 (L) 12/08/2015   MCV 83.9 12/08/2015   PLT 293 12/08/2015   NEUTROABS 4.2 12/08/2015     ASSESSMENT & PLAN:  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 Biopsy of peritoneal implant: Invol by a DLBCL.positive for CD20, CD79a,CD10, bcl-6, and bcl-2. CD3 and CD5 highlight scattered admixed T-cells. CD34 and TdT neg  PET/CT 11/02/2015: Nodular thickening within the mesenteries of small bowel 17 mm concerning for peritoneal implant of high-grade lymphoma, activity along the abdominal wound with inflammation suspicion for infection/abscess/fistula of bladder Testicular ultrasound: Normal  Bone marrow biopsy08/20/2017: Hypercellular bone marrow for age 54-60% with trilineage hematopoiesis, plasma cells 9% with Kappa Light chain excess, SPEP 1.5 gm M-Protein Monoclonal paraproteinemia: I will get a bone survey to complete myeloma workup. I believe the patient has MGUS.  I spoke with multiple specialists who believe that the patient does not have myeloma and does not need to be treated as such if he does not have end organ dysfunction like hypercalcemia or lytic bone lesions or renal dysfunction. The other thought was whether this may be reactive in nature because of recent abdominal surgery and potential wound infection. I would like to obtain immunofixation electrophoresis to further clarify the type of monoclonal protein.  Lab review: LDH 184; hepatitis B, C, HIV testing negative IgG: 1703 Iron studies: 10% saturation, hemoglobin 11.9  Prognosis: Revised IPI score: 1 (4 year disease free survival rate: 80%, estimated overall survival 79%) If I uses age of less than or equal to 60, revised IPI score 68, 34-year-old DFS  94%, estimated overall survival 94%  Current treatment: R-CHOP chemotherapy 6 cycles started 11/17/2015  Chemotherapy toxicities: 1. Profound neutropenia grade 3: encouraged him to wear a mask and instructed him on neutropenic precautions 2. Severe fatigue: Grade 2-3 3. Nausea grade 1   Abdominal incision infection: Patient has seen urology and Dr. Rosendo Gros. Ciystogram did not reveal a fistula. It is healing very slowly Insomnia: I given him a prescription for Ambien 10 mg at bedtime and I discontinued lorazepam.  I recommended that the patient undergo lumbar puncture with intrathecal methotrexate injection on 12/26/2015. I believe that is necessary because he has high risk lymphoma. Return to clinic in 3 weeks for cycle 3   Orders Placed This Encounter  Procedures  . DG Bone Survey Met    Patient has elevated monoclonal protein    Standing Status:   Future    Standing Expiration Date:   02/06/2017    Order Specific Question:   Reason for Exam (SYMPTOM  OR DIAGNOSIS REQUIRED)    Answer:   staging myeloma    Order Specific Question:   Preferred imaging location?    Answer:   [facility]    Standing Status:   Future    Number of Occurrences:   1    Standing Expiration Date:   01/11/2017  . Immunofixation electrophoresis    Standing Status:   Future    Number of Occurrences:   1    Standing Expiration Date:   01/11/2017  . Multiple Myeloma Panel (SPEP&IFE w/QIG)    Standing Status:   Future    Number of Occurrences:   1    Standing Expiration Date:   01/11/2017  . IFE/Light Chains/TP Qn, 24-Hr Ur    Standing Status:   Future    Number of Occurrences:   1    Standing Expiration Date:   01/11/2017   The patient has a good understanding of the overall plan. he agrees with it. he will call with any problems that may develop before the next visit here.   Rulon Eisenmenger, MD 12/08/15

## 2015-12-08 NOTE — Assessment & Plan Note (Signed)
Small intestine resection, proximal ileum08/10/2015: Involvement by diffuse large B-cell lymphoma,. Biopsy of peritoneal implant: Invol by a DLBCL.positive for CD20, CD79a,CD10, bcl-6, and bcl-2. CD3 and CD5 highlight scattered admixed T-cells. CD34 and TdT neg  PET/CT 11/02/2015: Nodular thickening within the mesenteries of small bowel 17 mm concerning for peritoneal implant of high-grade lymphoma, activity along the abdominal wound with inflammation suspicion for infection/abscess/fistula of bladder Testicular ultrasound: Normal  Bone marrow biopsy08/20/2017: Hypercellular bone marrow for age 86-60% with trilineage hematopoiesis, plasma cells 9% with Kappa Light chain excess, SPEP 1.5 gm M-Protein Monoclonal paraproteinemia: I will get a bone survey to complete myeloma workup. I believe the patient has MGUS. I spoke with multiple specialists who believe that the patient does not have myeloma and does not need to be treated as such if he does not have end organ dysfunction like hypercalcemia or lytic bone lesions or renal dysfunction. The other thought was whether this may be reactive in nature because of recent abdominal surgery and potential wound infection. I would like to obtain immunofixation electrophoresis to further clarify the type of monoclonal protein.  Lab review: LDH 184; hepatitis B, C, HIV testing negative IgG: 1703 Iron studies: 10% saturation, hemoglobin 12.3  Prognosis: Revised IPI score: 1 (4 year disease free survival rate: 80%, estimated overall survival 79%) If I uses age of less than or equal to 60, revised IPI score 38, 89-year-old DFS 94%, estimated overall survival 94%  Current treatment: R-CHOP chemotherapy 6 cycles started 11/17/2015  Chemotherapy toxicities: 1. Profound neutropenia grade 3: encouraged him to wear a mask and instructed him on neutropenic precautions 2. Severe fatigue: Grade 2-3 3. Nausea grade 1   Abdominal incision infection: Patient has  seen urology and Dr. Rosendo Gros. Ciystogram did not reveal a fistula. It is healing very slowly  Return to clinic in 3 weeks for cycle 3

## 2015-12-09 ENCOUNTER — Encounter: Payer: Self-pay | Admitting: Hematology and Oncology

## 2015-12-11 ENCOUNTER — Telehealth: Payer: Self-pay

## 2015-12-11 ENCOUNTER — Other Ambulatory Visit (HOSPITAL_BASED_OUTPATIENT_CLINIC_OR_DEPARTMENT_OTHER): Payer: BLUE CROSS/BLUE SHIELD

## 2015-12-11 ENCOUNTER — Other Ambulatory Visit: Payer: Self-pay | Admitting: Hematology and Oncology

## 2015-12-11 ENCOUNTER — Telehealth: Payer: Self-pay | Admitting: *Deleted

## 2015-12-11 DIAGNOSIS — C8339 Diffuse large B-cell lymphoma, extranodal and solid organ sites: Secondary | ICD-10-CM | POA: Diagnosis not present

## 2015-12-11 LAB — CBC WITH DIFFERENTIAL/PLATELET
BASO%: 0.1 % (ref 0.0–2.0)
Basophils Absolute: 0 10*3/uL (ref 0.0–0.1)
EOS%: 0 % (ref 0.0–7.0)
Eosinophils Absolute: 0 10*3/uL (ref 0.0–0.5)
HCT: 35.4 % — ABNORMAL LOW (ref 38.4–49.9)
HGB: 11.7 g/dL — ABNORMAL LOW (ref 13.0–17.1)
LYMPH%: 2.9 % — AB (ref 14.0–49.0)
MCH: 27.9 pg (ref 27.2–33.4)
MCHC: 33 g/dL (ref 32.0–36.0)
MCV: 84.4 fL (ref 79.3–98.0)
MONO#: 0.6 10*3/uL (ref 0.1–0.9)
MONO%: 1.6 % (ref 0.0–14.0)
NEUT#: 39.8 10*3/uL — ABNORMAL HIGH (ref 1.5–6.5)
NEUT%: 95.4 % — AB (ref 39.0–75.0)
Platelets: 270 10*3/uL (ref 140–400)
RBC: 4.2 10*6/uL (ref 4.20–5.82)
RDW: 15.7 % — ABNORMAL HIGH (ref 11.0–14.6)
WBC: 41.7 10*3/uL — ABNORMAL HIGH (ref 4.0–10.3)
lymph#: 1.2 10*3/uL (ref 0.9–3.3)

## 2015-12-11 LAB — COMPREHENSIVE METABOLIC PANEL
ALT: 22 U/L (ref 0–55)
AST: 18 U/L (ref 5–34)
Albumin: 3.2 g/dL — ABNORMAL LOW (ref 3.5–5.0)
Alkaline Phosphatase: 108 U/L (ref 40–150)
Anion Gap: 9 mEq/L (ref 3–11)
BUN: 18.8 mg/dL (ref 7.0–26.0)
CHLORIDE: 103 meq/L (ref 98–109)
CO2: 27 meq/L (ref 22–29)
CREATININE: 0.8 mg/dL (ref 0.7–1.3)
Calcium: 8.9 mg/dL (ref 8.4–10.4)
EGFR: >90 mL/min/{1.73_m2} (ref >90)
Glucose: 98 mg/dl (ref 70–140)
POTASSIUM: 3.3 meq/L — AB (ref 3.5–5.1)
Sodium: 139 mEq/L (ref 136–145)
Total Bilirubin: 0.46 mg/dL (ref 0.20–1.20)
Total Protein: 7.3 g/dL (ref 6.4–8.3)

## 2015-12-11 LAB — IMMUNOFIXATION ELECTROPHORESIS
IGA/IMMUNOGLOBULIN A, SERUM: 146 mg/dL (ref 90–386)
IgM, Qn, Serum: 96 mg/dL (ref 20–172)
TOTAL PROTEIN: 7.4 g/dL (ref 6.0–8.5)

## 2015-12-11 LAB — KAPPA/LAMBDA LIGHT CHAINS
Ig Kappa Free Light Chain: 15.9 mg/L (ref 3.3–19.4)
Ig Lambda Free Light Chain: 13.5 mg/L (ref 5.7–26.3)
KAPPA/LAMBDA FLC RATIO: 1.18 (ref 0.26–1.65)

## 2015-12-11 NOTE — Telephone Encounter (Signed)
Pt's wife called in question about labs.  Per Karna Christmas - yes schedule today.  Confirmed 10:30 appt w/ pt's wife.

## 2015-12-11 NOTE — Telephone Encounter (Signed)
I let pt wife know appointments for lab, R-chop, spinal tap and IT chemo will all be on Friday starting at 815.  She voiced understanding.

## 2015-12-11 NOTE — Addendum Note (Signed)
Addended by: Margaret Pyle on: 12/11/2015 03:33 PM   Modules accepted: Orders

## 2015-12-12 LAB — MULTIPLE MYELOMA PANEL, SERUM
Albumin SerPl Elph-Mcnc: 3.5 g/dL (ref 2.9–4.4)
Albumin/Glob SerPl: 0.9 (ref 0.7–1.7)
Alpha 1: 0.2 g/dL (ref 0.0–0.4)
Alpha2 Glob SerPl Elph-Mcnc: 0.8 g/dL (ref 0.4–1.0)
B-Globulin SerPl Elph-Mcnc: 1.1 g/dL (ref 0.7–1.3)
GAMMA GLOB SERPL ELPH-MCNC: 1.7 g/dL (ref 0.4–1.8)
GLOBULIN, TOTAL: 3.9 g/dL (ref 2.2–3.9)
M PROTEIN SERPL ELPH-MCNC: 1.3 g/dL — AB

## 2015-12-15 ENCOUNTER — Ambulatory Visit (HOSPITAL_COMMUNITY)
Admission: RE | Admit: 2015-12-15 | Discharge: 2015-12-15 | Disposition: A | Discharge status: Home / Self Care | Payer: BLUE CROSS/BLUE SHIELD | Source: Ambulatory Visit | Attending: Hematology and Oncology | Admitting: Hematology and Oncology

## 2015-12-15 DIAGNOSIS — D472 Monoclonal gammopathy: Secondary | ICD-10-CM | POA: Diagnosis not present

## 2015-12-15 DIAGNOSIS — C9000 Multiple myeloma not having achieved remission: Secondary | ICD-10-CM | POA: Diagnosis not present

## 2015-12-15 LAB — UIFE/LIGHT CHAINS/TP QN, 24-HR UR
FR KAPPA LT CH,24HR: 131 mg/24 hr
FR LAMBDA LT CH,24HR: 2 mg/24 hr
FREE KAPPA LT CHAINS, UR: 93.6 mg/L — AB (ref 1.35–24.19)
Free Lambda Lt Chains,Ur: 1.66 mg/L (ref 0.24–6.66)
KAPPA/LAMBDA RATIO, U: 56.39 — AB (ref 2.04–10.37)
PROTEIN 24H UR: 199 mg/(24.h) — AB (ref 30–150)
PROTEIN,TOTAL,URINE: 14.2 mg/dL

## 2015-12-22 ENCOUNTER — Encounter (HOSPITAL_COMMUNITY): Payer: Self-pay | Admitting: General Surgery

## 2015-12-25 ENCOUNTER — Telehealth: Payer: Self-pay | Admitting: *Deleted

## 2015-12-25 NOTE — Telephone Encounter (Signed)
Received initial VM from pt at 1130 this morning.  Reviewed request from pt for Dr. Lindi Adie to call pt to discuss lab work with Dr. Lindi Adie who stated he would call pt back to discuss these results in more detail.  As pt called back since, called pt to confirm that Dr. Lindi Adie is aware of his request and stated he would call to discuss when he is done seeing today's patients and is able to.   Pt verbalized understanding and is without further questions or concerns at time of call.

## 2015-12-25 NOTE — Telephone Encounter (Signed)
Receive call again @ 320 from pt. Regards his test results. He ask if Dr. Lindi Adie or his Nurse would please give him a call back on his cell @ [phone]

## 2015-12-25 NOTE — Telephone Encounter (Signed)
"  I'm being tested to R/O Myeloma. I read results on MyChart and have questions about test results.  Bones look good is what I recall but the report says there are some questionable places with lucency.  The 24 hr urine test also has things I need to talk with someone about.  I really want to know what this is .  Please call my cell number [phone]."

## 2015-12-26 ENCOUNTER — Inpatient Hospital Stay: Payer: BLUE CROSS/BLUE SHIELD

## 2015-12-27 ENCOUNTER — Other Ambulatory Visit (HOSPITAL_COMMUNITY)
Admission: RE | Admit: 2015-12-27 | Discharge: 2015-12-27 | Disposition: A | Discharge status: Home / Self Care | Payer: BLUE CROSS/BLUE SHIELD | Source: Ambulatory Visit | Attending: Hematology and Oncology | Admitting: Hematology and Oncology

## 2015-12-27 ENCOUNTER — Ambulatory Visit: Payer: BLUE CROSS/BLUE SHIELD

## 2015-12-27 ENCOUNTER — Ambulatory Visit (HOSPITAL_BASED_OUTPATIENT_CLINIC_OR_DEPARTMENT_OTHER): Payer: BLUE CROSS/BLUE SHIELD | Admitting: Hematology and Oncology

## 2015-12-27 ENCOUNTER — Other Ambulatory Visit: Payer: Self-pay | Admitting: Hematology and Oncology

## 2015-12-27 ENCOUNTER — Other Ambulatory Visit: Payer: Self-pay

## 2015-12-27 VITALS — BP 113/75 | HR 73 | Temp 98.1°F | Resp 18 | Wt 188.7 lb

## 2015-12-27 DIAGNOSIS — C8339 Diffuse large B-cell lymphoma, extranodal and solid organ sites: Secondary | ICD-10-CM

## 2015-12-27 DIAGNOSIS — D472 Monoclonal gammopathy: Secondary | ICD-10-CM | POA: Diagnosis not present

## 2015-12-27 DIAGNOSIS — Z8572 Personal history of non-Hodgkin lymphomas: Secondary | ICD-10-CM | POA: Diagnosis not present

## 2015-12-27 LAB — PROTEIN AND GLUCOSE, CSF
Glucose, CSF: 54 mg/dL (ref 40–70)
Total  Protein, CSF: 40 mg/dL (ref 15–45)

## 2015-12-27 LAB — CSF CELL COUNT WITH DIFFERENTIAL
RBC Count, CSF: 16 /mm3 — ABNORMAL HIGH
Tube #: 1
WBC CSF: 1 /mm3 (ref 0–5)

## 2015-12-27 MED ORDER — SODIUM CHLORIDE 0.9 % IJ SOLN
Freq: Once | INTRAMUSCULAR | Status: AC
  Administered 2015-12-27: 13:00:00 via INTRATHECAL
  Filled 2015-12-27: qty 0.48

## 2015-12-27 NOTE — Progress Notes (Signed)
1345: Dressing remains clean/dry/intact with no signs of bleeding.  Pt denies headache or other side effects from procedure.  Vitals obtained and remain stable as charted.  All questions discussed with pt and daughter and no further questions or concerns at time of discharge.  Reviewed AVS with pt who left ambulatory with no complaints accompanied by his daughter.

## 2015-12-27 NOTE — Progress Notes (Addendum)
Patient Care Team: Margo Common, MD as PCP - General (Internal Medicine)  DIAGNOSIS:  Encounter Diagnosis  Name Primary?  . Diffuse large B-cell lymphoma of extranodal site ([place]) Yes    SUMMARY OF ONCOLOGIC HISTORY:   Diffuse large B-cell lymphoma of extranodal site ([place])   10/17/2015 Initial Diagnosis    Small intestine resection, proximal ileum: Involvement by diffuse large B-cell lymphoma,. Biopsy of peritoneal implant: Invol by a DLBCL.positive for CD20, CD79a,CD10, bcl-6, and bcl-2. CD3 and CD5 highlight scattered admixed T-cells. CD34 and TdT neg      10/31/2015 Pathology Results    Bone marrow biopsy: Hypercellular bone marrow for age 49-60% with trilineage hematopoiesis, plasma cells 9% with Kappa Light chain excess      11/02/2015 PET scan    Nodular thickening within the mesenteries of small bowel 17 mm concerning for peritoneal implant of high-grade lymphoma, activity along the abdominal wound with inflammation suspicion for infection/abscess/fistula of bladder      11/02/2015 Imaging    Ultrasound scrotum: Normal testes, no epididymitis, no varicocele or hydrocele      11/17/2015 -  Chemotherapy    R CHOP 6 cycles       CHIEF COMPLIANT: Spinal tap, and to discuss the bone lesions on bone survey  INTERVAL HISTORY: Edward Steele is a 60 yr old who is now on chemo with RCHOP for DLBCL. He also has a M protein and elevated Kapp Lambda  As well as urine M protein. He ha Financial planner which showed 3 lytic leaions  REVIEW OF SYSTEMS:   Constitutional: Denies fevers, chills or abnormal weight loss Eyes: Denies blurriness of vision Ears, nose, mouth, throat, and face: Denies mucositis or sore throat Respiratory: Denies cough, dyspnea or wheezes Cardiovascular: Denies palpitation, chest discomfort Gastrointestinal:  Denies nausea, heartburn or change in bowel habits Skin: Denies abnormal skin rashes Lymphatics: Denies new lymphadenopathy or easy  bruising Neurological:Denies numbness, tingling or new weaknesses Behavioral/Psych: Mood is stable, no new changes  Extremities: No lower extremity edema Breast: denies any pain or lumps or nodules in either breasts All other systems were reviewed with the patient and are negative.  I have reviewed the past medical history, past surgical history, social history and family history with the patient and they are unchanged from previous note.  ALLERGIES:  is allergic to penicillin g.  MEDICATIONS:  Current Outpatient Prescriptions  Medication Sig Dispense Refill  . albuterol (PROVENTIL HFA;VENTOLIN HFA) 108 (90 Base) MCG/ACT inhaler Inhale 2 puffs into the lungs every 6 (six) hours as needed for wheezing or shortness of breath.     . allopurinol (ZYLOPRIM) 300 MG tablet Take 1 tablet (300 mg total) by mouth daily. (Patient not taking: Reported on 11/10/2015) 30 tablet 3  . beclomethasone (QVAR) 40 MCG/ACT inhaler Inhale 1 puff into the lungs daily.    . clonazePAM (KLONOPIN) 1 MG tablet Take 1 mg by mouth 2 (two) times daily.    Marland Kitchen DEXILANT 60 MG capsule Take 60 mg by mouth daily.    Marland Kitchen FLUoxetine (PROZAC) 20 MG tablet Take 40 mg by mouth 2 (two) times daily.    Marland Kitchen gabapentin (NEURONTIN) 300 MG capsule Take 300 mg by mouth at bedtime as needed (nerve pain).     Marland Kitchen lidocaine-prilocaine (EMLA) cream Apply to affected area once 30 g 3  . ondansetron (ZOFRAN) 8 MG tablet Take 1 tablet (8 mg total) by mouth every 8 (eight) hours as needed for refractory nausea / vomiting. 30 tablet  1  . oxyCODONE-acetaminophen (ROXICET) 5-325 MG tablet Take 1-2 tablets by mouth every 4 (four) hours as needed. 30 tablet 0  . predniSONE (DELTASONE) 20 MG tablet Take 3 tablets (60 mg total) by mouth daily. Take on days 1-5 of chemotherapy. 90 tablet 0  . prochlorperazine (COMPAZINE) 10 MG tablet Take 1 tablet (10 mg total) by mouth every 6 (six) hours as needed (Nausea or vomiting). 30 tablet 6  . rosuvastatin (CRESTOR) 5 MG  tablet Take 5 mg by mouth daily.    Marland Kitchen zolpidem (AMBIEN) 10 MG tablet Take 1 tablet (10 mg total) by mouth at bedtime as needed for sleep. 30 tablet 0   No current facility-administered medications for this visit.     PHYSICAL EXAMINATION: ECOG PERFORMANCE STATUS:1  Vitals:   12/27/15 1207 12/27/15 1345  BP: 116/67 113/75  Pulse: 99 73  Resp: 18 18  Temp: 98.1 F (36.7 C) 98.1 F (36.7 C)   Filed Weights   12/27/15 1207  Weight: 188 lb 11.2 oz (85.6 kg)    GENERAL:alert, no distress and comfortable SKIN: skin color, texture, turgor are normal, no rashes or significant lesions EYES: normal, Conjunctiva are pink and non-injected, sclera clear OROPHARYNX:no exudate, no erythema and lips, buccal mucosa, and tongue normal  NECK: supple, thyroid normal size, non-tender, without nodularity LYMPH:  no palpable lymphadenopathy in the cervical, axillary or inguinal LUNGS: clear to auscultation and percussion with normal breathing effort HEART: regular rate & rhythm and no murmurs and no lower extremity edema ABDOMEN:abdomen soft, non-tender and normal bowel sounds MUSCULOSKELETAL:no cyanosis of digits and no clubbing  NEURO: alert & oriented x 3 with fluent speech, no focal motor/sensory deficits EXTREMITIES: No lower extremity edema BREAST No palpable masses or nodules in either right or left breasts. No palpable axillary supraclavicular or infraclavicular adenopathy no breast tenderness or nipple discharge. (exam performed in the presence of a chaperone)  LABORATORY DATA:  I have reviewed the data as listed   Chemistry      Component Value Date/Time   NA 139 12/11/2015 1050   K 3.3 (L) 12/11/2015 1050   CL 105 10/18/2015 0444   CO2 27 12/11/2015 1050   BUN 18.8 12/11/2015 1050   CREATININE 0.8 12/11/2015 1050      Component Value Date/Time   CALCIUM 8.9 12/11/2015 1050   ALKPHOS 108 12/11/2015 1050   AST 18 12/11/2015 1050   ALT 22 12/11/2015 1050   BILITOT 0.46  12/11/2015 1050       Lab Results  Component Value Date   WBC 41.7 (H) 12/11/2015   HGB 11.7 (L) 12/11/2015   HCT 35.4 (L) 12/11/2015   MCV 84.4 12/11/2015   PLT 270 12/11/2015   NEUTROABS 39.8 (H) 12/11/2015     ASSESSMENT & PLAN:  Diffuse large B-cell lymphoma of extranodal site [facility]) Small intestine resection, proximal ileum08/10/2015: Involvement by diffuse large B-cell lymphoma,. Biopsy of peritoneal implant: Invol by a DLBCL.positive for CD20, CD79a,CD10, bcl-6, and bcl-2. CD3 and CD5 highlight scattered admixed T-cells. CD34 and TdT neg  PET/CT 11/02/2015: Nodular thickening within the mesenteries of small bowel 17 mm concerning for peritoneal implant of high-grade lymphoma, activity along the abdominal wound with inflammation suspicion for infection/abscess/fistula of bladder Testicular ultrasound: Normal  Bone marrow biopsy08/20/2017: Hypercellular bone marrow for age 65-60% with trilineage hematopoiesis, plasma cells 9% with Kappa Light chain excess, SPEP 1.5 gm M-Protein Monoclonal paraproteinemia: Bone survey showed bone lytic lesions.Elevated M protein Plan: 1. MRI Rt Hip 2. If  lesion is present, will try to biopsy it Refer to [facility] for evaluation and for a second opinion reg the M protein and lytic lesions. ----------------------------------------------------------------------------------------------------------------- DLBCL: Prognosis: Revised IPI score: 1 (4 year disease free survival rate: 80%, estimated overall survival 79%) If I uses age of less than or equal to 65, revised IPI score 38, 78-year-old DFS 94%, estimated overall survival 94%  Current treatment: R-CHOP chemotherapy 6 cycles started 11/17/2015  Chemotherapy toxicities: 1. Profound neutropenia grade 3: encouraged him to wear a mask and instructed him on neutropenic precautions 2. Severe fatigue: Grade 2-3 3. Nausea grade 1   Abdominal incision infection: Patient has seen urology and Dr.  Rosendo Gros. Ciystogram did not reveal a fistula. It is healing very slowly  Return to clinic for cycle 3 ---------------------------------------------------------------------------------------------------------------------------------------------------------------------------------------------------------------------------------------------------------------------------------  Lumbar puncture procedure note  Date of Procedure: 12/27/15 Indication: DLBCL of small bowel Type of Anesthesia: Local 1% lidocaine  Consent has been obtained from the patient explaining the risks and benefits of the procedure including pain, bleeding and infection. Patient was placed in a sitting position leaning forward arching the back. Patient's name and date of birth were verified. The skin was sterilized with Betadine. 1% lidocaine was used to anesthetize the skin and subcutaneous tissues. The spinal needle was used to enter the L4-L5 intervertebral space and advanced to get clear CSF. Fluid was collected in specimen containers and labeled in the room to be sent for testing. 8 cc of CSF was obtained.  IT Methotrexate 12 mg was given over 5 mins.  The CSF was sent for cell count, cytology, glucose and protein. Patient will return next week to go over the results of the tests or sooner if the cell count suggests infection.  The needle was removed and pressure was placed the site of needle entry. Small Band-Aid was then placed over the site and patient was made to lay on the back or 15 minutes prior to discharge.   Patient was given instructions on avoiding exertional activities. Patient was instructed to call us if there was any signs and symptoms of bleeding or intractable headaches.  Complications: None Blood loss : None Disposition: Tolerated the procedure well and discharged home.  Signed Rulon Eisenmenger,  MD   --------------------------------------------------------------------------------------------------------------------------------------------------------  Orders Placed This Encounter  Procedures  . Protein and glucose, CSF   The patient has a good understanding of the overall plan. he agrees with it. he will call with any problems that may develop before the next visit here.   Rulon Eisenmenger, MD 12/27/15

## 2015-12-27 NOTE — Patient Instructions (Signed)
Lumbar Puncture A lumbar puncture, or spinal tap, is a procedure in which a small amount of the fluid that surrounds the brain and spinal cord is removed and examined. The fluid is called the cerebrospinal fluid. This procedure may be done to:   Help diagnose various problems, such as meningitis, encephalitis, multiple sclerosis, and AIDS.   Remove fluid and relieve pressure that occurs with certain types of headaches.   Look for bleeding within the brain and spinal cord areas (central nervous system).   Place medicine into the spinal fluid.  LET [facility] CARE PROVIDER KNOW ABOUT:  Any allergies you have.  All medicines you are taking, including vitamins, herbs, eye drops, creams, and over-the-counter medicines.  Previous problems you or members of your family have had with the use of anesthetics.  Any blood disorders you have.  Previous surgeries you have had.  Medical conditions you have. RISKS AND COMPLICATIONS Generally, this is a safe procedure. However, as with any procedure, complications can occur. Possible complications include:   Spinal headache. This is a severe headache that occurs when there is a leak of spinal fluid. A spinal headache causes discomfort but is not dangerous. If it persists, another procedure may be done to treat the headache.  Bleeding. This most often occurs in people with bleeding disorders. These are disorders in which the blood does not clot normally.   Infection at the insertion site that can spread to the bone or spinal fluid.  Formation of a spinal cord tumor (rare).  Brain herniation or movement of the brain into the spinal cord (rare).  Inability to move (extremely rare). BEFORE THE PROCEDURE  You may have blood tests done. These tests can help tell how well your kidneys and liver are working. They can also show how well your blood clots.   If you take blood thinners (anticoagulant medicine), ask your health care provider if  and when you should stop taking them.   Your health care provider may order a CT scan of your brain.  Make arrangements for someone to drive you home after the procedure.  PROCEDURE  You will be positioned so that the spaces between the bones of the spine (vertebrae) are as wide as possible. This will make it easier to pass the needle into the spinal canal.  Depending on your age and size, you may lie on your side, curled up with your knees under your chin. Or, you may sit with your head resting on a pillow that is placed at waist level.  The skin covering the lower back (or lumbar region) will be cleaned.   The skin may be numbed with medicine.  You may be given pain medicine or a medicine to help you relax (sedative).  A small needle will be inserted in the skin until it enters the space that contains the spinal fluid. The needle will not enter the spinal cord.   The spinal fluid will be collected into tubes.   The needle will be withdrawn, and a bandage will be placed on the site.  AFTER THE PROCEDURE  You will remain lying down for 1 hour or for as long as your health care provider suggests.   The spinal fluid will be sent to a laboratory to be examined. The results of the examination may be available before you go home.  A test, called a culture, may be taken of the spinal fluid if your health care provider thinks you have an infection. If cultures  were taken for exam, the results will usually be available in a couple of days.    This information is not intended to replace advice given to you by your health care provider. Make sure you discuss any questions you have with your health care provider.   Document Released: 02/23/2000 Document Revised: 12/16/2012 Document Reviewed: 11/02/2012 Elsevier Interactive Patient Education 2016 [employer]  Lumbar Puncture, Care After Refer to this sheet in the next few weeks. These instructions provide you with information  on caring for yourself after your procedure. Your health care provider may also give you more specific instructions. Your treatment has been planned according to current medical practices, but problems sometimes occur. Call your health care provider if you have any problems or questions after your procedure. WHAT TO EXPECT AFTER THE PROCEDURE After your procedure, it is typical to have the following sensations:  Mild discomfort or pain at the insertion site.  Mild headache that is relieved with pain medicines. HOME CARE INSTRUCTIONS  Avoid lifting anything heavier than 10 lb (4.5 kg) for at least 12 hours after the procedure.  Drink enough fluids to keep your urine clear or pale yellow. SEEK MEDICAL CARE IF:  You have fever or chills.  You have nausea or vomiting.  You have a headache that lasts for more than 2 days. SEEK IMMEDIATE MEDICAL CARE IF:  You have any numbness or tingling in your legs.  You are unable to control your bowel or bladder.  You have bleeding or swelling in your back at the insertion site.  You are dizzy or faint.   This information is not intended to replace advice given to you by your health care provider. Make sure you discuss any questions you have with your health care provider.   Document Released: 03/02/2013 Document Reviewed: 03/02/2013 Elsevier Interactive Patient Education [employer].

## 2015-12-27 NOTE — Assessment & Plan Note (Signed)
Small intestine resection, proximal ileum08/10/2015: Involvement by diffuse large B-cell lymphoma,. Biopsy of peritoneal implant: Invol by a DLBCL.positive for CD20, CD79a,CD10, bcl-6, and bcl-2. CD3 and CD5 highlight scattered admixed T-cells. CD34 and TdT neg  PET/CT 11/02/2015: Nodular thickening within the mesenteries of small bowel 17 mm concerning for peritoneal implant of high-grade lymphoma, activity along the abdominal wound with inflammation suspicion for infection/abscess/fistula of bladder Testicular ultrasound: Normal  Bone marrow biopsy08/20/2017: Hypercellular bone marrow for age 79-60% with trilineage hematopoiesis, plasma cells 9% with Kappa Light chain excess, SPEP 1.5 gm M-Protein Monoclonal paraproteinemia: Bone survey showed bone lytic lesions.Elevated M protein Plan: 1. MRI Rt Hip 2. If lesion is present, will try to biopsy it Refer to [facility] for evaluation and for a second opinion reg the M protein and lytic lesions. ----------------------------------------------------------------------------------------------------------------- DLBCL: Prognosis: Revised IPI score: 1 (4 year disease free survival rate: 80%, estimated overall survival 79%) If I uses age of less than or equal to 29, revised IPI score 75, 26-year-old DFS 94%, estimated overall survival 94%  Current treatment: R-CHOP chemotherapy 6 cycles started 11/17/2015  Chemotherapy toxicities: 1. Profound neutropenia grade 3: encouraged him to wear a mask and instructed him on neutropenic precautions 2. Severe fatigue: Grade 2-3 3. Nausea grade 1   Abdominal incision infection: Patient has seen urology and Dr. Rosendo Gros. Ciystogram did not reveal a fistula. It is healing very slowly  Return to clinic in 3 weeks for cycle 3

## 2015-12-28 ENCOUNTER — Other Ambulatory Visit: Payer: Self-pay

## 2015-12-28 DIAGNOSIS — C8339 Diffuse large B-cell lymphoma, extranodal and solid organ sites: Secondary | ICD-10-CM

## 2015-12-28 NOTE — Progress Notes (Signed)
Orders entered for MRI of right hip for evaluation of lytic lesion as well as ambulatory referral for [facility] for work up of lymphoma/multiple myeloma/auto SCT per Dr. Lindi Adie.

## 2015-12-28 NOTE — Progress Notes (Signed)
Additional order entered for ambulatory referral to oncology as pt called and stated he would like referral sent to [facility] rather than [facility].

## 2015-12-29 ENCOUNTER — Ambulatory Visit (HOSPITAL_BASED_OUTPATIENT_CLINIC_OR_DEPARTMENT_OTHER): Payer: BLUE CROSS/BLUE SHIELD

## 2015-12-29 ENCOUNTER — Ambulatory Visit: Payer: BLUE CROSS/BLUE SHIELD | Admitting: Hematology and Oncology

## 2015-12-29 ENCOUNTER — Other Ambulatory Visit (HOSPITAL_BASED_OUTPATIENT_CLINIC_OR_DEPARTMENT_OTHER): Payer: BLUE CROSS/BLUE SHIELD

## 2015-12-29 VITALS — BP 111/66 | HR 73 | Temp 97.8°F | Resp 18

## 2015-12-29 DIAGNOSIS — D701 Agranulocytosis secondary to cancer chemotherapy: Secondary | ICD-10-CM | POA: Diagnosis not present

## 2015-12-29 DIAGNOSIS — C8339 Diffuse large B-cell lymphoma, extranodal and solid organ sites: Secondary | ICD-10-CM

## 2015-12-29 DIAGNOSIS — Z5112 Encounter for antineoplastic immunotherapy: Secondary | ICD-10-CM

## 2015-12-29 DIAGNOSIS — C83398 Diffuse large b-cell lymphoma of other extranodal and solid organ sites: Secondary | ICD-10-CM

## 2015-12-29 DIAGNOSIS — Z23 Encounter for immunization: Secondary | ICD-10-CM

## 2015-12-29 DIAGNOSIS — Z5111 Encounter for antineoplastic chemotherapy: Secondary | ICD-10-CM

## 2015-12-29 LAB — CBC WITH DIFFERENTIAL/PLATELET
BASO%: 1 % (ref 0.0–2.0)
BASOS ABS: 0 10*3/uL (ref 0.0–0.1)
EOS ABS: 0 10*3/uL (ref 0.0–0.5)
EOS%: 0.6 % (ref 0.0–7.0)
HCT: 35 % — ABNORMAL LOW (ref 38.4–49.9)
HGB: 11.7 g/dL — ABNORMAL LOW (ref 13.0–17.1)
LYMPH%: 27.9 % (ref 14.0–49.0)
MCH: 28.6 pg (ref 27.2–33.4)
MCHC: 33.3 g/dL (ref 32.0–36.0)
MCV: 85.9 fL (ref 79.3–98.0)
MONO#: 0.5 10*3/uL (ref 0.1–0.9)
MONO%: 13.8 % (ref 0.0–14.0)
NEUT#: 2.1 10*3/uL (ref 1.5–6.5)
NEUT%: 56.7 % (ref 39.0–75.0)
Platelets: 236 10*3/uL (ref 140–400)
RBC: 4.08 10*6/uL — AB (ref 4.20–5.82)
RDW: 19.7 % — AB (ref 11.0–14.6)
WBC: 3.7 10*3/uL — ABNORMAL LOW (ref 4.0–10.3)
lymph#: 1 10*3/uL (ref 0.9–3.3)

## 2015-12-29 LAB — COMPREHENSIVE METABOLIC PANEL
ALBUMIN: 3.4 g/dL — AB (ref 3.5–5.0)
ALK PHOS: 82 U/L (ref 40–150)
ALT: 25 U/L (ref 0–55)
AST: 27 U/L (ref 5–34)
Anion Gap: 8 mEq/L (ref 3–11)
BUN: 15.6 mg/dL (ref 7.0–26.0)
CHLORIDE: 105 meq/L (ref 98–109)
CO2: 26 meq/L (ref 22–29)
Calcium: 9.2 mg/dL (ref 8.4–10.4)
Creatinine: 0.9 mg/dL (ref 0.7–1.3)
EGFR: 89 mL/min/{1.73_m2} — AB (ref >90)
GLUCOSE: 87 mg/dL (ref 70–140)
POTASSIUM: 4.2 meq/L (ref 3.5–5.1)
SODIUM: 139 meq/L (ref 136–145)
Total Bilirubin: 0.36 mg/dL (ref 0.20–1.20)
Total Protein: 7.3 g/dL (ref 6.4–8.3)

## 2015-12-29 MED ORDER — PALONOSETRON HCL INJECTION 0.25 MG/5ML
INTRAVENOUS | Status: AC
  Filled 2015-12-29: qty 5

## 2015-12-29 MED ORDER — PEGFILGRASTIM 6 MG/0.6ML ~~LOC~~ PSKT
6.0000 mg | PREFILLED_SYRINGE | Freq: Once | SUBCUTANEOUS | Status: AC
  Administered 2015-12-29: 6 mg via SUBCUTANEOUS
  Filled 2015-12-29: qty 0.6

## 2015-12-29 MED ORDER — DIPHENHYDRAMINE HCL 25 MG PO CAPS
ORAL_CAPSULE | ORAL | Status: AC
  Filled 2015-12-29: qty 2

## 2015-12-29 MED ORDER — SODIUM CHLORIDE 0.9 % IV SOLN
Freq: Once | INTRAVENOUS | Status: AC
  Administered 2015-12-29: 09:00:00 via INTRAVENOUS

## 2015-12-29 MED ORDER — DIPHENHYDRAMINE HCL 25 MG PO CAPS
50.0000 mg | ORAL_CAPSULE | Freq: Once | ORAL | Status: AC
  Administered 2015-12-29: 50 mg via ORAL

## 2015-12-29 MED ORDER — ACETAMINOPHEN 325 MG PO TABS
ORAL_TABLET | ORAL | Status: AC
  Filled 2015-12-29: qty 2

## 2015-12-29 MED ORDER — PALONOSETRON HCL INJECTION 0.25 MG/5ML
0.2500 mg | Freq: Once | INTRAVENOUS | Status: AC
  Administered 2015-12-29: 0.25 mg via INTRAVENOUS

## 2015-12-29 MED ORDER — SODIUM CHLORIDE 0.9 % IV SOLN
650.0000 mg/m2 | Freq: Once | INTRAVENOUS | Status: AC
  Administered 2015-12-29: 1320 mg via INTRAVENOUS
  Filled 2015-12-29: qty 66

## 2015-12-29 MED ORDER — HEPARIN SOD (PORK) LOCK FLUSH 100 UNIT/ML IV SOLN
500.0000 [IU] | Freq: Once | INTRAVENOUS | Status: AC | PRN
  Administered 2015-12-29: 500 [IU]
  Filled 2015-12-29: qty 5

## 2015-12-29 MED ORDER — VINCRISTINE SULFATE CHEMO INJECTION 1 MG/ML
2.0000 mg | Freq: Once | INTRAVENOUS | Status: AC
  Administered 2015-12-29: 2 mg via INTRAVENOUS
  Filled 2015-12-29: qty 2

## 2015-12-29 MED ORDER — SODIUM CHLORIDE 0.9% FLUSH
10.0000 mL | INTRAVENOUS | Status: DC | PRN
  Administered 2015-12-29: 10 mL
  Filled 2015-12-29: qty 10

## 2015-12-29 MED ORDER — DEXAMETHASONE SODIUM PHOSPHATE 10 MG/ML IJ SOLN
INTRAMUSCULAR | Status: AC
  Filled 2015-12-29: qty 1

## 2015-12-29 MED ORDER — INFLUENZA VAC SPLIT QUAD 0.5 ML IM SUSY
0.5000 mL | PREFILLED_SYRINGE | Freq: Once | INTRAMUSCULAR | Status: AC
  Administered 2015-12-29: 0.5 mL via INTRAMUSCULAR
  Filled 2015-12-29: qty 0.5

## 2015-12-29 MED ORDER — ACETAMINOPHEN 325 MG PO TABS
650.0000 mg | ORAL_TABLET | Freq: Once | ORAL | Status: AC
  Administered 2015-12-29: 650 mg via ORAL

## 2015-12-29 MED ORDER — SODIUM CHLORIDE 0.9 % IV SOLN
375.0000 mg/m2 | Freq: Once | INTRAVENOUS | Status: AC
  Administered 2015-12-29: 800 mg via INTRAVENOUS
  Filled 2015-12-29: qty 30

## 2015-12-29 MED ORDER — DOXORUBICIN HCL CHEMO IV INJECTION 2 MG/ML
45.0000 mg/m2 | Freq: Once | INTRAVENOUS | Status: AC
  Administered 2015-12-29: 92 mg via INTRAVENOUS
  Filled 2015-12-29: qty 46

## 2015-12-29 MED ORDER — DEXAMETHASONE SODIUM PHOSPHATE 10 MG/ML IJ SOLN
10.0000 mg | Freq: Once | INTRAMUSCULAR | Status: AC
  Administered 2015-12-29: 10 mg via INTRAVENOUS

## 2015-12-29 NOTE — Patient Instructions (Signed)
[facility] Discharge Instructions for Patients Receiving Chemotherapy  Today you received the following chemotherapy agents: Adriamycin, Vincristine, Cytoxan, Rituxan.   To help prevent nausea and vomiting after your treatment, we encourage you to take your nausea medication as prescribed.   If you develop nausea and vomiting that is not controlled by your nausea medication, call the clinic.   BELOW ARE SYMPTOMS THAT SHOULD BE REPORTED IMMEDIATELY:  *FEVER GREATER THAN 100.5 F  *CHILLS WITH OR WITHOUT FEVER  NAUSEA AND VOMITING THAT IS NOT CONTROLLED WITH YOUR NAUSEA MEDICATION  *UNUSUAL SHORTNESS OF BREATH  *UNUSUAL BRUISING OR BLEEDING  TENDERNESS IN MOUTH AND THROAT WITH OR WITHOUT PRESENCE OF ULCERS  *URINARY PROBLEMS  *BOWEL PROBLEMS  UNUSUAL RASH Items with * indicate a potential emergency and should be followed up as soon as possible.  Feel free to call the clinic you have any questions or concerns. The clinic phone number is [phone].  Please show the [place] at check-in to the Emergency Department and triage nurse.  Influenza Virus Vaccine injection What is this medicine? INFLUENZA VIRUS VACCINE (in floo EN zuh VAHY ruhs vak SEEN) helps to reduce the risk of getting influenza also known as the flu. The vaccine only helps protect you against some strains of the flu. This medicine may be used for other purposes; ask your health care provider or pharmacist if you have questions. What should I tell my health care provider before I take this medicine? They need to know if you have any of these conditions: -bleeding disorder like hemophilia -fever or infection -Guillain-Barre syndrome or other neurological problems -immune system problems -infection with the human immunodeficiency virus (HIV) or AIDS -low blood platelet counts -multiple sclerosis -an unusual or allergic reaction to influenza virus vaccine, latex, other medicines, foods,  dyes, or preservatives. Different brands of vaccines contain different allergens. Some may contain latex or eggs. Talk to your doctor about your allergies to make sure that you get the right vaccine. -pregnant or trying to get pregnant -breast-feeding How should I use this medicine? This vaccine is for injection into a muscle or under the skin. It is given by a health care professional. A copy of Vaccine Information Statements will be given before each vaccination. Read this sheet carefully each time. The sheet may change frequently. Talk to your healthcare provider to see which vaccines are right for you. Some vaccines should not be used in all age groups. Overdosage: If you think you have taken too much of this medicine contact a poison control center or emergency room at once. NOTE: This medicine is only for you. Do not share this medicine with others. What if I miss a dose? This does not apply. What may interact with this medicine? -chemotherapy or radiation therapy -medicines that lower your immune system like etanercept, anakinra, infliximab, and adalimumab -medicines that treat or prevent blood clots like warfarin -phenytoin -steroid medicines like prednisone or cortisone -theophylline -vaccines This list may not describe all possible interactions. Give your health care provider a list of all the medicines, herbs, non-prescription drugs, or dietary supplements you use. Also tell them if you smoke, drink alcohol, or use illegal drugs. Some items may interact with your medicine. What should I watch for while using this medicine? Report any side effects that do not go away within 3 days to your doctor or health care professional. Call your health care provider if any unusual symptoms occur within 6 weeks of receiving this vaccine.  You may still catch the flu, but the illness is not usually as bad. You cannot get the flu from the vaccine. The vaccine will not protect against colds or other  illnesses that may cause fever. The vaccine is needed every year. What side effects may I notice from receiving this medicine? Side effects that you should report to your doctor or health care professional as soon as possible: -allergic reactions like skin rash, itching or hives, swelling of the face, lips, or tongue Side effects that usually do not require medical attention (report to your doctor or health care professional if they continue or are bothersome): -fever -headache -muscle aches and pains -pain, tenderness, redness, or swelling at the injection site -tiredness This list may not describe all possible side effects. Call your doctor for medical advice about side effects. You may report side effects to FDA at 1-800-FDA-1088. Where should I keep my medicine? The vaccine will be given by a health care professional in a clinic, pharmacy, doctor's office, or other health care setting. You will not be given vaccine doses to store at home. NOTE: This sheet is a summary. It may not cover all possible information. If you have questions about this medicine, talk to your doctor, pharmacist, or health care provider.    2016, Elsevier/Gold Standard. (2014-09-16 10:07:28)

## 2016-01-02 ENCOUNTER — Telehealth: Payer: Self-pay | Admitting: Hematology and Oncology

## 2016-01-02 NOTE — Telephone Encounter (Signed)
Pt appt with Dr. Dellis Filbert is 01/09/16@9 :54 Faxed path reports to [phone]. Remaining records will go thru care everywhere. Pt is aware.

## 2016-01-03 ENCOUNTER — Ambulatory Visit (HOSPITAL_COMMUNITY)
Admission: RE | Admit: 2016-01-03 | Discharge: 2016-01-03 | Disposition: A | Discharge status: Home / Self Care | Payer: BLUE CROSS/BLUE SHIELD | Source: Ambulatory Visit | Attending: Hematology and Oncology | Admitting: Hematology and Oncology

## 2016-01-03 ENCOUNTER — Telehealth: Payer: Self-pay | Admitting: *Deleted

## 2016-01-03 DIAGNOSIS — C8590 Non-Hodgkin lymphoma, unspecified, unspecified site: Secondary | ICD-10-CM | POA: Diagnosis not present

## 2016-01-03 DIAGNOSIS — C8339 Diffuse large B-cell lymphoma, extranodal and solid organ sites: Secondary | ICD-10-CM

## 2016-01-03 MED ORDER — GADOBENATE DIMEGLUMINE 529 MG/ML IV SOLN
20.0000 mL | Freq: Once | INTRAVENOUS | Status: AC | PRN
  Administered 2016-01-03: 17 mL via INTRAVENOUS

## 2016-01-03 NOTE — Telephone Encounter (Signed)
"  Justice with [facility] [phone] calling about a referral we received.  The provider needs scans and Xrays."  Call transferred Radiology File room.

## 2016-01-04 ENCOUNTER — Ambulatory Visit (HOSPITAL_COMMUNITY): Payer: BLUE CROSS/BLUE SHIELD

## 2016-01-09 DIAGNOSIS — C8333 Diffuse large B-cell lymphoma, intra-abdominal lymph nodes: Secondary | ICD-10-CM | POA: Diagnosis not present

## 2016-01-09 DIAGNOSIS — C8339 Diffuse large B-cell lymphoma, extranodal and solid organ sites: Secondary | ICD-10-CM | POA: Diagnosis not present

## 2016-01-09 DIAGNOSIS — D472 Monoclonal gammopathy: Secondary | ICD-10-CM | POA: Diagnosis not present

## 2016-01-10 ENCOUNTER — Telehealth: Payer: Self-pay | Admitting: *Deleted

## 2016-01-10 DIAGNOSIS — L03311 Cellulitis of abdominal wall: Secondary | ICD-10-CM | POA: Diagnosis not present

## 2016-01-10 NOTE — Telephone Encounter (Signed)
Gave Dr. Lindi Adie contact information for Dr. Dellis Filbert at [facility].  Dr. Lindi Adie to contact Dr. Dellis Filbert as requested.

## 2016-01-10 NOTE — Telephone Encounter (Signed)
"  Leonie Man MD with oncology at [facility] calling to speak with Dr. Lindi Adie about this mutual patient.  She is also an oncologist." Will accept return call to [phone].  She is an oncologist."

## 2016-01-11 ENCOUNTER — Other Ambulatory Visit: Payer: Self-pay

## 2016-01-11 DIAGNOSIS — C8330 Diffuse large B-cell lymphoma, unspecified site: Secondary | ICD-10-CM | POA: Diagnosis not present

## 2016-01-11 MED ORDER — ZOLPIDEM TARTRATE 10 MG PO TABS: 10.0000 mg | ORAL_TABLET | Freq: Every evening | ORAL | 0 refills | Status: DC | PRN

## 2016-01-11 NOTE — Telephone Encounter (Signed)
Received VM requesting prescription refill for Ambien.  Upon chart review, refill deemed appropriate and phoned in to [facility] in [facility] as requested.  Called and spoke to pt's wife who verbalized understanding.  No further questions or concerns at time of call.

## 2016-01-17 ENCOUNTER — Telehealth: Payer: Self-pay | Admitting: Hematology and Oncology

## 2016-01-17 NOTE — Telephone Encounter (Signed)
Pt called to confirm receipt of request of records to be certified and picked up on Friday 01/19/16.  Records have been prepared and are ready to be notarized (release id [id number])

## 2016-01-19 ENCOUNTER — Ambulatory Visit (HOSPITAL_BASED_OUTPATIENT_CLINIC_OR_DEPARTMENT_OTHER): Payer: BLUE CROSS/BLUE SHIELD

## 2016-01-19 ENCOUNTER — Other Ambulatory Visit (HOSPITAL_BASED_OUTPATIENT_CLINIC_OR_DEPARTMENT_OTHER): Payer: BLUE CROSS/BLUE SHIELD

## 2016-01-19 VITALS — BP 112/79 | HR 68 | Temp 98.1°F | Resp 18

## 2016-01-19 DIAGNOSIS — Z5112 Encounter for antineoplastic immunotherapy: Secondary | ICD-10-CM | POA: Diagnosis not present

## 2016-01-19 DIAGNOSIS — D701 Agranulocytosis secondary to cancer chemotherapy: Secondary | ICD-10-CM | POA: Diagnosis not present

## 2016-01-19 DIAGNOSIS — C8339 Diffuse large B-cell lymphoma, extranodal and solid organ sites: Secondary | ICD-10-CM

## 2016-01-19 DIAGNOSIS — Z5111 Encounter for antineoplastic chemotherapy: Secondary | ICD-10-CM | POA: Diagnosis not present

## 2016-01-19 LAB — CBC WITH DIFFERENTIAL/PLATELET
BASO%: 0.7 % (ref 0.0–2.0)
BASOS ABS: 0 10*3/uL (ref 0.0–0.1)
EOS%: 0.4 % (ref 0.0–7.0)
Eosinophils Absolute: 0 10*3/uL (ref 0.0–0.5)
HEMATOCRIT: 36.5 % — AB (ref 38.4–49.9)
HEMOGLOBIN: 12.2 g/dL — AB (ref 13.0–17.1)
LYMPH#: 1.3 10*3/uL (ref 0.9–3.3)
LYMPH%: 25 % (ref 14.0–49.0)
MCH: 29.2 pg (ref 27.2–33.4)
MCHC: 33.3 g/dL (ref 32.0–36.0)
MCV: 87.5 fL (ref 79.3–98.0)
MONO#: 0.6 10*3/uL (ref 0.1–0.9)
MONO%: 12.7 % (ref 0.0–14.0)
NEUT#: 3.1 10*3/uL (ref 1.5–6.5)
NEUT%: 61.2 % (ref 39.0–75.0)
PLATELETS: 332 10*3/uL (ref 140–400)
RBC: 4.17 10*6/uL — ABNORMAL LOW (ref 4.20–5.82)
RDW: 21.6 % — AB (ref 11.0–14.6)
WBC: 5 10*3/uL (ref 4.0–10.3)

## 2016-01-19 LAB — COMPREHENSIVE METABOLIC PANEL
ALBUMIN: 3.5 g/dL (ref 3.5–5.0)
ALK PHOS: 92 U/L (ref 40–150)
ALT: 27 U/L (ref 0–55)
AST: 28 U/L (ref 5–34)
Anion Gap: 9 mEq/L (ref 3–11)
BUN: 18.8 mg/dL (ref 7.0–26.0)
CALCIUM: 9.5 mg/dL (ref 8.4–10.4)
CO2: 25 mEq/L (ref 22–29)
CREATININE: 0.9 mg/dL (ref 0.7–1.3)
Chloride: 105 mEq/L (ref 98–109)
EGFR: >90 mL/min/{1.73_m2} (ref >90)
Glucose: 98 mg/dl (ref 70–140)
Potassium: 4.3 mEq/L (ref 3.5–5.1)
Sodium: 139 mEq/L (ref 136–145)
Total Bilirubin: 0.34 mg/dL (ref 0.20–1.20)
Total Protein: 7.8 g/dL (ref 6.4–8.3)

## 2016-01-19 MED ORDER — PEGFILGRASTIM 6 MG/0.6ML ~~LOC~~ PSKT
6.0000 mg | PREFILLED_SYRINGE | Freq: Once | SUBCUTANEOUS | Status: AC
  Administered 2016-01-19: 6 mg via SUBCUTANEOUS
  Filled 2016-01-19: qty 0.6

## 2016-01-19 MED ORDER — DOXORUBICIN HCL CHEMO IV INJECTION 2 MG/ML
45.0000 mg/m2 | Freq: Once | INTRAVENOUS | Status: AC
  Administered 2016-01-19: 92 mg via INTRAVENOUS
  Filled 2016-01-19: qty 46

## 2016-01-19 MED ORDER — HEPARIN SOD (PORK) LOCK FLUSH 100 UNIT/ML IV SOLN
500.0000 [IU] | Freq: Once | INTRAVENOUS | Status: AC | PRN
  Administered 2016-01-19: 500 [IU]
  Filled 2016-01-19: qty 5

## 2016-01-19 MED ORDER — ACETAMINOPHEN 325 MG PO TABS
650.0000 mg | ORAL_TABLET | Freq: Once | ORAL | Status: AC
  Administered 2016-01-19: 650 mg via ORAL

## 2016-01-19 MED ORDER — SODIUM CHLORIDE 0.9 % IV SOLN
650.0000 mg/m2 | Freq: Once | INTRAVENOUS | Status: AC
  Administered 2016-01-19: 1320 mg via INTRAVENOUS
  Filled 2016-01-19: qty 66

## 2016-01-19 MED ORDER — ACETAMINOPHEN 325 MG PO TABS
ORAL_TABLET | ORAL | Status: AC
  Filled 2016-01-19: qty 2

## 2016-01-19 MED ORDER — PALONOSETRON HCL INJECTION 0.25 MG/5ML
INTRAVENOUS | Status: AC
  Filled 2016-01-19: qty 5

## 2016-01-19 MED ORDER — DIPHENHYDRAMINE HCL 25 MG PO CAPS
ORAL_CAPSULE | ORAL | Status: AC
  Filled 2016-01-19: qty 2

## 2016-01-19 MED ORDER — SODIUM CHLORIDE 0.9 % IV SOLN
Freq: Once | INTRAVENOUS | Status: AC
  Administered 2016-01-19: 10:00:00 via INTRAVENOUS

## 2016-01-19 MED ORDER — SODIUM CHLORIDE 0.9 % IV SOLN
375.0000 mg/m2 | Freq: Once | INTRAVENOUS | Status: AC
  Administered 2016-01-19: 800 mg via INTRAVENOUS
  Filled 2016-01-19: qty 50

## 2016-01-19 MED ORDER — SODIUM CHLORIDE 0.9% FLUSH
10.0000 mL | INTRAVENOUS | Status: DC | PRN
  Administered 2016-01-19: 10 mL
  Filled 2016-01-19: qty 10

## 2016-01-19 MED ORDER — DEXAMETHASONE SODIUM PHOSPHATE 10 MG/ML IJ SOLN
10.0000 mg | Freq: Once | INTRAMUSCULAR | Status: AC
  Administered 2016-01-19: 10 mg via INTRAVENOUS

## 2016-01-19 MED ORDER — VINCRISTINE SULFATE CHEMO INJECTION 1 MG/ML
2.0000 mg | Freq: Once | INTRAVENOUS | Status: AC
  Administered 2016-01-19: 2 mg via INTRAVENOUS
  Filled 2016-01-19: qty 2

## 2016-01-19 MED ORDER — PALONOSETRON HCL INJECTION 0.25 MG/5ML
0.2500 mg | Freq: Once | INTRAVENOUS | Status: AC
  Administered 2016-01-19: 0.25 mg via INTRAVENOUS

## 2016-01-19 MED ORDER — DIPHENHYDRAMINE HCL 25 MG PO CAPS
50.0000 mg | ORAL_CAPSULE | Freq: Once | ORAL | Status: AC
  Administered 2016-01-19: 50 mg via ORAL

## 2016-01-19 MED ORDER — DEXAMETHASONE SODIUM PHOSPHATE 10 MG/ML IJ SOLN
INTRAMUSCULAR | Status: AC
  Filled 2016-01-19: qty 1

## 2016-01-19 NOTE — Patient Instructions (Signed)
[facility] Discharge Instructions for Patients Receiving Chemotherapy  Today you received the following chemotherapy agents: Adriamycin, Vincristine, Cytoxan, Rituxan.   To help prevent nausea and vomiting after your treatment, we encourage you to take your nausea medication as prescribed.   If you develop nausea and vomiting that is not controlled by your nausea medication, call the clinic.   BELOW ARE SYMPTOMS THAT SHOULD BE REPORTED IMMEDIATELY:  *FEVER GREATER THAN 100.5 F  *CHILLS WITH OR WITHOUT FEVER  NAUSEA AND VOMITING THAT IS NOT CONTROLLED WITH YOUR NAUSEA MEDICATION  *UNUSUAL SHORTNESS OF BREATH  *UNUSUAL BRUISING OR BLEEDING  TENDERNESS IN MOUTH AND THROAT WITH OR WITHOUT PRESENCE OF ULCERS  *URINARY PROBLEMS  *BOWEL PROBLEMS  UNUSUAL RASH Items with * indicate a potential emergency and should be followed up as soon as possible.  Feel free to call the clinic you have any questions or concerns. The clinic phone number is [phone].  Please show the [place] at check-in to the Emergency Department and triage nurse.  Influenza Virus Vaccine injection What is this medicine? INFLUENZA VIRUS VACCINE (in floo EN zuh VAHY ruhs vak SEEN) helps to reduce the risk of getting influenza also known as the flu. The vaccine only helps protect you against some strains of the flu. This medicine may be used for other purposes; ask your health care provider or pharmacist if you have questions. What should I tell my health care provider before I take this medicine? They need to know if you have any of these conditions: -bleeding disorder like hemophilia -fever or infection -Guillain-Barre syndrome or other neurological problems -immune system problems -infection with the human immunodeficiency virus (HIV) or AIDS -low blood platelet counts -multiple sclerosis -an unusual or allergic reaction to influenza virus vaccine, latex, other medicines, foods,  dyes, or preservatives. Different brands of vaccines contain different allergens. Some may contain latex or eggs. Talk to your doctor about your allergies to make sure that you get the right vaccine. -pregnant or trying to get pregnant -breast-feeding How should I use this medicine? This vaccine is for injection into a muscle or under the skin. It is given by a health care professional. A copy of Vaccine Information Statements will be given before each vaccination. Read this sheet carefully each time. The sheet may change frequently. Talk to your healthcare provider to see which vaccines are right for you. Some vaccines should not be used in all age groups. Overdosage: If you think you have taken too much of this medicine contact a poison control center or emergency room at once. NOTE: This medicine is only for you. Do not share this medicine with others. What if I miss a dose? This does not apply. What may interact with this medicine? -chemotherapy or radiation therapy -medicines that lower your immune system like etanercept, anakinra, infliximab, and adalimumab -medicines that treat or prevent blood clots like warfarin -phenytoin -steroid medicines like prednisone or cortisone -theophylline -vaccines This list may not describe all possible interactions. Give your health care provider a list of all the medicines, herbs, non-prescription drugs, or dietary supplements you use. Also tell them if you smoke, drink alcohol, or use illegal drugs. Some items may interact with your medicine. What should I watch for while using this medicine? Report any side effects that do not go away within 3 days to your doctor or health care professional. Call your health care provider if any unusual symptoms occur within 6 weeks of receiving this vaccine.  You may still catch the flu, but the illness is not usually as bad. You cannot get the flu from the vaccine. The vaccine will not protect against colds or other  illnesses that may cause fever. The vaccine is needed every year. What side effects may I notice from receiving this medicine? Side effects that you should report to your doctor or health care professional as soon as possible: -allergic reactions like skin rash, itching or hives, swelling of the face, lips, or tongue Side effects that usually do not require medical attention (report to your doctor or health care professional if they continue or are bothersome): -fever -headache -muscle aches and pains -pain, tenderness, redness, or swelling at the injection site -tiredness This list may not describe all possible side effects. Call your doctor for medical advice about side effects. You may report side effects to FDA at 1-800-FDA-1088. Where should I keep my medicine? The vaccine will be given by a health care professional in a clinic, pharmacy, doctor's office, or other health care setting. You will not be given vaccine doses to store at home. NOTE: This sheet is a summary. It may not cover all possible information. If you have questions about this medicine, talk to your doctor, pharmacist, or health care provider.    2016, Elsevier/Gold Standard. (2014-09-16 10:07:28)

## 2016-01-27 ENCOUNTER — Encounter: Payer: Self-pay | Admitting: Hematology and Oncology

## 2016-01-30 ENCOUNTER — Telehealth: Payer: Self-pay

## 2016-01-30 NOTE — Telephone Encounter (Signed)
Called pt back regarding symptoms of numbness and tingling on hands and feet. Pt is on neurontin. Pt also has some shaking. Symptoms not as bad today but pt wife states pt was having difficulty the last few days. Discussed email received from wife, Manuela Schwartz, with Dr. Lindi Adie and would like to see pt on his next treatment with RCHOP. Sent urgent scheduling msg for MD appt on 12/1. Pt wife verbalized understanding. Advised to call if there were any worsening of symptoms or any new concerns before appt.

## 2016-02-05 ENCOUNTER — Ambulatory Visit (INDEPENDENT_AMBULATORY_CARE_PROVIDER_SITE_OTHER)
Admission: RE | Admit: 2016-02-05 | Discharge: 2016-02-05 | Disposition: A | Discharge status: Home / Self Care | Payer: BLUE CROSS/BLUE SHIELD | Source: Ambulatory Visit | Attending: Internal Medicine | Admitting: Internal Medicine

## 2016-02-05 ENCOUNTER — Ambulatory Visit (HOSPITAL_COMMUNITY)
Admission: RE | Admit: 2016-02-05 | Discharge: 2016-02-05 | Disposition: A | Discharge status: Home / Self Care | Payer: BLUE CROSS/BLUE SHIELD | Source: Ambulatory Visit | Attending: Internal Medicine | Admitting: Internal Medicine

## 2016-02-05 VITALS — BP 120/80 | HR 87 | Wt 195.0 lb

## 2016-02-05 DIAGNOSIS — C8339 Diffuse large B-cell lymphoma, extranodal and solid organ sites: Secondary | ICD-10-CM

## 2016-02-05 DIAGNOSIS — Z09 Encounter for follow-up examination after completed treatment for conditions other than malignant neoplasm: Secondary | ICD-10-CM

## 2016-02-05 DIAGNOSIS — I501 Left ventricular failure: Secondary | ICD-10-CM | POA: Insufficient documentation

## 2016-02-05 NOTE — Progress Notes (Signed)
Echocardiogram 2D Echocardiogram has been performed.  Edward Steele 02/05/2016, 11:34 AM

## 2016-02-05 NOTE — Progress Notes (Signed)
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 Lindi Adie for enrollment into the [facility].    SUMMARY OF ONCOLOGIC HISTORY:       Diffuse large B-cell lymphoma of extranodal site ([place])   10/17/2015 Initial Diagnosis    Small intestine resection, proximal ileum: Involvement by diffuse large B-cell lymphoma,. Biopsy of peritoneal implant: Invol by a DLBCL.positive for CD20, CD79a,CD10, bcl-6, and bcl-2. CD3 and CD5 highlight scattered admixed T-cells. CD34 and TdT neg     10/31/2015 Pathology Results    Bone marrow biopsy: Hypercellular bone marrow for age 13-60% with trilineage hematopoiesis, plasma cells 9% with Kappa Light chain excess     11/02/2015 PET scan    Nodular thickening within the mesenteries of small bowel 17 mm concerning for peritoneal implant of high-grade lymphoma, activity along the abdominal wound with inflammation suspicion for infection/abscess/fistula of bladder     11/02/2015 Imaging    Ultrasound scrotum: Normal testes, no epididymitis, no varicocele or hydrocele      Denies h/o known heart disease. Had heart cath in [place] about 5 years ago with clean coronaries.   Had intestinal resection on 10/17/15. On 8/30 had wound re-opened as it was not healing fully and continued to drain. Now healing by secondary intestinal.  Started chemo with CHOP x 6 (including adriamycin) on 11/17/15.   He presents today for cardio-oncology follow up. Having a good week.  Gets cycle 5 on Friday, Dec 1st.  Has been doing OK overall. Has been having some neuropathy. Dr. Lindi Adie is going to see Friday when he goes in for his appointment Friday.  When he is low (4-5 days after chemo) he is fatigued and gets occasional SOB with exertion.  No peripheral edema, no orthopnea, no PND. No CP. Every now and then his breath "catches".    Echo today shows LVEF 60-65%, Lat S' 12.6, GLS - 18.4  ECHO 11/10/2015 EF 55-60% Normal RV. Normal diastolic parameters (images reviewed with him and his wife personally)   Review of Systems negative except as reported above in HPI.   Past Medical History:  Diagnosis Date  . Anemia    oral iron supplement used  . Anxiety   . Cancer ([place])    skin cancer - face basal and squamous  . Chronic back pain    lower back  . Constipation   . Depression   . GERD (gastroesophageal reflux disease)   . Head injury, closed, with concussion   . History of kidney stones    x 4 episodes-no recent issues  . Pneumonia    age 52  . Recurrent sinus infections    past history-none recent " Inhalers were for this- no use im many yrs"    Current Outpatient Prescriptions  Medication Sig Dispense Refill  . acetaminophen (TYLENOL) 325 MG tablet Take 650 mg by mouth every 6 (six) hours as needed for mild pain.    Marland Kitchen allopurinol (ZYLOPRIM) 300 MG tablet Take 300 mg by mouth daily.    . clonazePAM (KLONOPIN) 1 MG tablet Take 1 mg by mouth 2 (two) times daily.    Marland Kitchen DEXILANT 60 MG capsule Take 60 mg by mouth daily.    Marland Kitchen FLUoxetine (PROZAC) 20 MG tablet Take 40 mg by mouth 2 (two) times daily.    Marland Kitchen gabapentin (NEURONTIN) 300 MG capsule Take 300 mg by  mouth at bedtime as needed (nerve pain).     Marland Kitchen lidocaine-prilocaine (EMLA) cream Apply to affected area once 30 g 3  . oxyCODONE-acetaminophen (ROXICET) 5-325 MG tablet Take 1-2 tablets by mouth every 4 (four) hours as needed. 30 tablet 0  . predniSONE (DELTASONE) 20 MG tablet Take 3 tablets (60 mg total) by mouth daily. Take on days 1-5 of chemotherapy. 90 tablet 0  . prochlorperazine (COMPAZINE) 10 MG tablet Take 1 tablet (10 mg total) by mouth every 6 (six) hours as needed (Nausea or vomiting). 30 tablet 6  . rosuvastatin (CRESTOR) 5 MG tablet Take 5 mg by mouth daily.    Marland Kitchen zolpidem (AMBIEN) 10 MG tablet Take 1 tablet (10 mg total) by mouth at bedtime as needed  for sleep. 30 tablet 0  . ondansetron (ZOFRAN) 8 MG tablet Take 1 tablet (8 mg total) by mouth every 8 (eight) hours as needed for refractory nausea / vomiting. (Patient not taking: Reported on 02/05/2016) 30 tablet 1   No current facility-administered medications for this encounter.     Allergies  Allergen Reactions  . Penicillin G Hives    Childhood allergy Has patient had a PCN reaction causing immediate rash, facial/tongue/throat swelling, SOB or lightheadedness with hypotension: n/a Has patient had a PCN reaction causing severe rash involving mucus membranes or skin necrosis: n/a Has patient had a PCN reaction that required hospitalization: n/a Has patient had a PCN reaction occurring within the last 10 years:  n/a If all of the above answers are "NO", then may proceed with Cephalosporin use.      Social History   Social History  . Marital status: Married    Spouse name: N/A  . Number of children: N/A  . Years of education: N/A   Occupational History  . Not on file.   Social History Main Topics  . Smoking status: Never Smoker  . Smokeless tobacco: Never Used  . Alcohol use No  . Drug use: No  . Sexual activity: Yes   Other Topics Concern  . Not on file   Social History Narrative  . No narrative on file   Family Hx:  Dad with CABG x 4 in 22s Mom alive CAD s/p MI, AF   Vitals:   02/05/16 1201  BP: 120/80  Pulse: 87  SpO2: 98%  Weight: 195 lb (88.5 kg)    PHYSICAL EXAM: General:  Elderly appearing. NAD.  HEENT: Normal Neck: supple. JVD not elevated Carotids 2+ bilat; no bruits. No thyromegaly or nodule noted.  Cor: PMI nondisplaced. RRR. No M/G/R. Left port-a-cath Lungs: CTAB, normal effort.  Abdomen: soft, NT, ND, no HSM. No bruits or masses. +BS Extremities: no cyanosis, clubbing, rash, edema Neuro: alert & oriented x 3, cranial nerves grossly intact. moves all 4 extremities w/o difficulty. Affect pleasant.   ASSESSMENT & PLAN: 1. Small  intestinal diffuse large B-cell lymphoma  - s/p resection - c/b recent incisional infection - due for 5/6 CHOP chemotherapy this week. - Dr. Haroldine Laws reviewed echos personally. EF and Doppler stable - No HF on exam. Plan repeat Echo in 6 months to ensure stability. - Have previously discussed incidence of Adriamycin cardiotoxicity in detail include small possibility of very delayed toxicity.     Shirley Friar, PA-C 12:07 PM   Patient seen and examined with Oda Kilts, PA-C. We discussed all aspects of the encounter. I agree with the assessment and plan as stated above.   I reviewed echos personally. EF and Doppler  parameters stable. No HF on exam. Continue CHOP. Repeat echo in 6 months.   Bensimhon, Daniel,MD 7:54 PM

## 2016-02-08 ENCOUNTER — Telehealth: Payer: Self-pay

## 2016-02-08 ENCOUNTER — Other Ambulatory Visit: Payer: Self-pay

## 2016-02-08 NOTE — Telephone Encounter (Signed)
Received phone call from Maudie Mercury ([employer]) calling on behalf of pt's employer medical benefits plan, states that certain specialty medications will no longer be covered under the patients plan and will now need to be covered under [facility] to dispense of medication. Also, was informed that pt last chemo RCHOP 11/10 and 10/20 and 11/10 were not covered by pt insurance bc/bs, as well as neulasta. Pt is coming back tomorrow for another RCHOP at 845. Maudie Mercury states that she is requesting a one time exception approval for the past 2 treatments that were denied through pt employer's medical plan. Will need information about neulasta being delivered at home to self inject or to have it delivered at home and sent in during treatment. Told Kim that this information will be forwarded to Aleen Sells Chrismon, who handles chemo auths and will provide her information for call back.   [facility] SPECIALIST REP. [place] [phone] PRIOR AUTH [phone]

## 2016-02-08 NOTE — Telephone Encounter (Signed)
Called pt regarding voicemail about insurance coverage for chemo. No answer.lvm provided call back information.

## 2016-02-08 NOTE — Assessment & Plan Note (Signed)
Diffuse large B-cell lymphoma of extranodal site [facility]) Small intestine resection, proximal ileum08/10/2015: Involvement by diffuse large B-cell lymphoma,. Biopsy of peritoneal implant: Invol by a DLBCL.positive for CD20, CD79a,CD10, bcl-6, and bcl-2. CD3 and CD5 highlight scattered admixed T-cells. CD34 and TdT neg  PET/CT 11/02/2015: Nodular thickening within the mesenteries of small bowel 17 mm concerning for peritoneal implant of high-grade lymphoma, activity along the abdominal wound with inflammation suspicion for infection/abscess/fistula of bladder Testicular ultrasound: Normal  Bone marrow biopsy08/20/2017: Hypercellular bone marrow for age 54-60% with trilineage hematopoiesis, plasma cells 9% with Kappa Light chain excess, SPEP 1.5 gm M-Protein Monoclonal paraproteinemia: Bone survey showed bone lytic lesions.Elevated M protein Plan: 1. MRI Rt Hip 2. If lesion is present, will try to biopsy it Refer to [facility] for evaluation and for a second opinion reg the M protein and lytic lesions. ----------------------------------------------------------------------------------------------------------------- DLBCL: Prognosis: Revised IPI score: 1 (4 year disease free survival rate: 80%, estimated overall survival 79%) If I uses age of less than or equal to 66, revised IPI score 47, 86-year-old DFS 94%, estimated overall survival 94%  Current treatment: R-CHOP chemotherapy 6 cycles started 11/17/2015  Chemotherapy toxicities: 1.Profound neutropenia grade 3:encouraged him to wear a mask and instructed him on neutropenic precautions 2. Severe fatigue: Grade 2-3 3. Nausea grade 1  Abdominal incision infection: Patient has seen urology and Dr. Rosendo Gros. Ciystogram did not reveal a fistula. It is healing very slowly  Return to clinic for cycle 5

## 2016-02-09 ENCOUNTER — Ambulatory Visit (HOSPITAL_BASED_OUTPATIENT_CLINIC_OR_DEPARTMENT_OTHER): Payer: BLUE CROSS/BLUE SHIELD | Admitting: Hematology and Oncology

## 2016-02-09 ENCOUNTER — Encounter: Payer: Self-pay | Admitting: Hematology and Oncology

## 2016-02-09 ENCOUNTER — Other Ambulatory Visit (HOSPITAL_BASED_OUTPATIENT_CLINIC_OR_DEPARTMENT_OTHER): Payer: BLUE CROSS/BLUE SHIELD

## 2016-02-09 ENCOUNTER — Ambulatory Visit (HOSPITAL_BASED_OUTPATIENT_CLINIC_OR_DEPARTMENT_OTHER): Payer: BLUE CROSS/BLUE SHIELD

## 2016-02-09 VITALS — BP 114/64 | HR 75 | Temp 98.1°F | Resp 18

## 2016-02-09 DIAGNOSIS — Z5112 Encounter for antineoplastic immunotherapy: Secondary | ICD-10-CM

## 2016-02-09 DIAGNOSIS — C8339 Diffuse large B-cell lymphoma, extranodal and solid organ sites: Secondary | ICD-10-CM

## 2016-02-09 DIAGNOSIS — G620 Drug-induced polyneuropathy: Secondary | ICD-10-CM

## 2016-02-09 DIAGNOSIS — Z5111 Encounter for antineoplastic chemotherapy: Secondary | ICD-10-CM

## 2016-02-09 DIAGNOSIS — D701 Agranulocytosis secondary to cancer chemotherapy: Secondary | ICD-10-CM

## 2016-02-09 DIAGNOSIS — T451X5A Adverse effect of antineoplastic and immunosuppressive drugs, initial encounter: Secondary | ICD-10-CM

## 2016-02-09 LAB — CBC WITH DIFFERENTIAL/PLATELET
BASO%: 0.8 % (ref 0.0–2.0)
Basophils Absolute: 0 10*3/uL (ref 0.0–0.1)
EOS ABS: 0 10*3/uL (ref 0.0–0.5)
EOS%: 0.8 % (ref 0.0–7.0)
HEMATOCRIT: 34.6 % — AB (ref 38.4–49.9)
HEMOGLOBIN: 11.6 g/dL — AB (ref 13.0–17.1)
LYMPH#: 1 10*3/uL (ref 0.9–3.3)
LYMPH%: 20.6 % (ref 14.0–49.0)
MCH: 29.9 pg (ref 27.2–33.4)
MCHC: 33.6 g/dL (ref 32.0–36.0)
MCV: 88.9 fL (ref 79.3–98.0)
MONO#: 0.5 10*3/uL (ref 0.1–0.9)
MONO%: 10.3 % (ref 0.0–14.0)
NEUT%: 67.5 % (ref 39.0–75.0)
NEUTROS ABS: 3.2 10*3/uL (ref 1.5–6.5)
PLATELETS: 271 10*3/uL (ref 140–400)
RBC: 3.89 10*6/uL — ABNORMAL LOW (ref 4.20–5.82)
RDW: 21.1 % — ABNORMAL HIGH (ref 11.0–14.6)
WBC: 4.8 10*3/uL (ref 4.0–10.3)

## 2016-02-09 LAB — COMPREHENSIVE METABOLIC PANEL
ALBUMIN: 3.4 g/dL — AB (ref 3.5–5.0)
ALK PHOS: 80 U/L (ref 40–150)
ALT: 35 U/L (ref 0–55)
ANION GAP: 9 meq/L (ref 3–11)
AST: 41 U/L — ABNORMAL HIGH (ref 5–34)
BILIRUBIN TOTAL: 0.43 mg/dL (ref 0.20–1.20)
BUN: 17.4 mg/dL (ref 7.0–26.0)
CALCIUM: 9.3 mg/dL (ref 8.4–10.4)
CO2: 25 meq/L (ref 22–29)
CREATININE: 0.8 mg/dL (ref 0.7–1.3)
Chloride: 105 mEq/L (ref 98–109)
EGFR: >90 mL/min/{1.73_m2} (ref >90)
Glucose: 103 mg/dl (ref 70–140)
Potassium: 4.1 mEq/L (ref 3.5–5.1)
Sodium: 138 mEq/L (ref 136–145)
TOTAL PROTEIN: 7.4 g/dL (ref 6.4–8.3)

## 2016-02-09 MED ORDER — PALONOSETRON HCL INJECTION 0.25 MG/5ML
0.2500 mg | Freq: Once | INTRAVENOUS | Status: AC
  Administered 2016-02-09: 0.25 mg via INTRAVENOUS

## 2016-02-09 MED ORDER — ZOLPIDEM TARTRATE 10 MG PO TABS: 10.0000 mg | ORAL_TABLET | Freq: Every evening | ORAL | 3 refills | Status: DC | PRN

## 2016-02-09 MED ORDER — PEGFILGRASTIM 6 MG/0.6ML ~~LOC~~ PSKT
6.0000 mg | PREFILLED_SYRINGE | Freq: Once | SUBCUTANEOUS | Status: AC
  Administered 2016-02-09: 6 mg via SUBCUTANEOUS
  Filled 2016-02-09: qty 0.6

## 2016-02-09 MED ORDER — ACETAMINOPHEN 325 MG PO TABS
ORAL_TABLET | ORAL | Status: AC
  Filled 2016-02-09: qty 2

## 2016-02-09 MED ORDER — DEXAMETHASONE SODIUM PHOSPHATE 10 MG/ML IJ SOLN
10.0000 mg | Freq: Once | INTRAMUSCULAR | Status: AC
  Administered 2016-02-09: 10 mg via INTRAVENOUS

## 2016-02-09 MED ORDER — SODIUM CHLORIDE 0.9 % IV SOLN
Freq: Once | INTRAVENOUS | Status: AC
  Administered 2016-02-09: 10:00:00 via INTRAVENOUS

## 2016-02-09 MED ORDER — SODIUM CHLORIDE 0.9% FLUSH
10.0000 mL | INTRAVENOUS | Status: DC | PRN
  Administered 2016-02-09: 10 mL
  Filled 2016-02-09: qty 10

## 2016-02-09 MED ORDER — SODIUM CHLORIDE 0.9 % IV SOLN
375.0000 mg/m2 | Freq: Once | INTRAVENOUS | Status: AC
  Administered 2016-02-09: 800 mg via INTRAVENOUS
  Filled 2016-02-09: qty 30

## 2016-02-09 MED ORDER — DOXORUBICIN HCL CHEMO IV INJECTION 2 MG/ML
45.0000 mg/m2 | Freq: Once | INTRAVENOUS | Status: AC
  Administered 2016-02-09: 92 mg via INTRAVENOUS
  Filled 2016-02-09: qty 46

## 2016-02-09 MED ORDER — DIPHENHYDRAMINE HCL 25 MG PO CAPS
ORAL_CAPSULE | ORAL | Status: AC
  Filled 2016-02-09: qty 2

## 2016-02-09 MED ORDER — HEPARIN SOD (PORK) LOCK FLUSH 100 UNIT/ML IV SOLN
500.0000 [IU] | Freq: Once | INTRAVENOUS | Status: AC | PRN
  Administered 2016-02-09: 500 [IU]
  Filled 2016-02-09: qty 5

## 2016-02-09 MED ORDER — DEXAMETHASONE SODIUM PHOSPHATE 10 MG/ML IJ SOLN
INTRAMUSCULAR | Status: AC
  Filled 2016-02-09: qty 1

## 2016-02-09 MED ORDER — PALONOSETRON HCL INJECTION 0.25 MG/5ML
INTRAVENOUS | Status: AC
  Filled 2016-02-09: qty 5

## 2016-02-09 MED ORDER — DIPHENHYDRAMINE HCL 25 MG PO CAPS
50.0000 mg | ORAL_CAPSULE | Freq: Once | ORAL | Status: AC
  Administered 2016-02-09: 50 mg via ORAL

## 2016-02-09 MED ORDER — ACETAMINOPHEN 325 MG PO TABS
650.0000 mg | ORAL_TABLET | Freq: Once | ORAL | Status: AC
  Administered 2016-02-09: 650 mg via ORAL

## 2016-02-09 MED ORDER — SODIUM CHLORIDE 0.9 % IV SOLN
650.0000 mg/m2 | Freq: Once | INTRAVENOUS | Status: AC
  Administered 2016-02-09: 1320 mg via INTRAVENOUS
  Filled 2016-02-09: qty 66

## 2016-02-09 NOTE — Progress Notes (Signed)
Patient Care Team: Margo Common, MD as PCP - General (Internal Medicine) Clydene Fake, MD as Referring Physician (Hematology and Oncology)  DIAGNOSIS:  Encounter Diagnosis  Name Primary?  . Diffuse large B-cell lymphoma of extranodal site ([place])     SUMMARY OF ONCOLOGIC HISTORY:   Diffuse large B-cell lymphoma of extranodal site ([place])   10/17/2015 Initial Diagnosis    Small intestine resection, proximal ileum: Involvement by diffuse large B-cell lymphoma,. Biopsy of peritoneal implant: Invol by a DLBCL.positive for CD20, CD79a,CD10, bcl-6, and bcl-2. CD3 and CD5 highlight scattered admixed T-cells. CD34 and TdT neg      10/31/2015 Pathology Results    Bone marrow biopsy: Hypercellular bone marrow for age 58-60% with trilineage hematopoiesis, plasma cells 9% with Kappa Light chain excess      11/02/2015 PET scan    Nodular thickening within the mesenteries of small bowel 17 mm concerning for peritoneal implant of high-grade lymphoma, activity along the abdominal wound with inflammation suspicion for infection/abscess/fistula of bladder      11/02/2015 Imaging    Ultrasound scrotum: Normal testes, no epididymitis, no varicocele or hydrocele      11/17/2015 -  Chemotherapy    R CHOP 6 cycles       CHIEF COMPLIANT: Follow-up on diffuse large B-cell lymphoma, cycle 5R CHOP  INTERVAL HISTORY: Edward Steele is a 60 year old with above-mentioned history diffuse large B-cell lymphoma who is here today to receive cycle 5 of R CHOP chemotherapy. Is tolerating R CHOP reasonably well. He has profound fatigue and occasional nausea. Incidentally we found an elevated monoclonal protein and slightly elevated plasmacytosis. He had been to [facility] for second opinion and they felt that this could be MGUS or smoldering myeloma. The bone lesions noted on x-ray were not visible on the PET scan on the MRI. It is for this reason the bone lesion was felt to be false positive reading.  Agent has been feeling severe fatigue from chemotherapy. He is also noted is neuropathy in his hands. Mildly in his feet. His insurance company suddenly decided to not cover Neulasta. It is unclear why it wasn't going to cover Neulasta since it is standard of care. Patient is extremely frustrated about it.  REVIEW OF SYSTEMS:   Constitutional: Denies fevers, chills or abnormal weight loss, alopecia, fatigue Eyes: Denies blurriness of vision Ears, nose, mouth, throat, and face: Denies mucositis or sore throat Respiratory: Denies cough, dyspnea or wheezes Cardiovascular: Denies palpitation, chest discomfort Gastrointestinal:  Denies nausea, heartburn or change in bowel habits Skin: Denies abnormal skin rashes Lymphatics: Denies new lymphadenopathy or easy bruising Neurological:Denies numbness, tingling or new weaknesses Behavioral/Psych: Mood is stable, no new changes  Extremities: No lower extremity edema  All other systems were reviewed with the patient and are negative.  I have reviewed the past medical history, past surgical history, social history and family history with the patient and they are unchanged from previous note.  ALLERGIES:  is allergic to penicillin g.  MEDICATIONS:  Current Outpatient Prescriptions  Medication Sig Dispense Refill  . acetaminophen (TYLENOL) 325 MG tablet Take 650 mg by mouth every 6 (six) hours as needed for mild pain.    Marland Kitchen allopurinol (ZYLOPRIM) 300 MG tablet Take 300 mg by mouth daily.    . clonazePAM (KLONOPIN) 1 MG tablet Take 1 mg by mouth 2 (two) times daily.    Marland Kitchen DEXILANT 60 MG capsule Take 60 mg by mouth daily.    Marland Kitchen FLUoxetine (PROZAC) 20  MG tablet Take 40 mg by mouth 2 (two) times daily.    Marland Kitchen gabapentin (NEURONTIN) 300 MG capsule Take 300 mg by mouth at bedtime as needed (nerve pain).     Marland Kitchen lidocaine-prilocaine (EMLA) cream Apply to affected area once 30 g 3  . ondansetron (ZOFRAN) 8 MG tablet Take 1 tablet (8 mg total) by mouth every 8  (eight) hours as needed for refractory nausea / vomiting. (Patient not taking: Reported on 02/05/2016) 30 tablet 1  . oxyCODONE-acetaminophen (ROXICET) 5-325 MG tablet Take 1-2 tablets by mouth every 4 (four) hours as needed. 30 tablet 0  . predniSONE (DELTASONE) 20 MG tablet Take 3 tablets (60 mg total) by mouth daily. Take on days 1-5 of chemotherapy. 90 tablet 0  . prochlorperazine (COMPAZINE) 10 MG tablet Take 1 tablet (10 mg total) by mouth every 6 (six) hours as needed (Nausea or vomiting). 30 tablet 6  . rosuvastatin (CRESTOR) 5 MG tablet Take 5 mg by mouth daily.    Marland Kitchen zolpidem (AMBIEN) 10 MG tablet Take 1 tablet (10 mg total) by mouth at bedtime as needed for sleep. 30 tablet 3   No current facility-administered medications for this visit.     PHYSICAL EXAMINATION: ECOG PERFORMANCE STATUS: 1 - Symptomatic but completely ambulatory  Vitals:   02/09/16 0850  BP: 127/74  Pulse: 81  Resp: 18  Temp: 97.8 F (36.6 C)   Filed Weights   02/09/16 0850  Weight: 194 lb 12.8 oz (88.4 kg)    GENERAL:alert, no distress and comfortable SKIN: skin color, texture, turgor are normal, no rashes or significant lesions EYES: normal, Conjunctiva are pink and non-injected, sclera clear OROPHARYNX:no exudate, no erythema and lips, buccal mucosa, and tongue normal  NECK: supple, thyroid normal size, non-tender, without nodularity LYMPH:  no palpable lymphadenopathy in the cervical, axillary or inguinal LUNGS: clear to auscultation and percussion with normal breathing effort HEART: regular rate & rhythm and no murmurs and no lower extremity edema ABDOMEN:abdomen soft, non-tender and normal bowel sounds MUSCULOSKELETAL:no cyanosis of digits and no clubbing  NEURO: alert & oriented x 3 with fluent speech, no focal motor/sensory deficits EXTREMITIES: No lower extremity edema  LABORATORY DATA:  I have reviewed the data as listed   Chemistry      Component Value Date/Time   NA 138 02/09/2016  0840   K 4.1 02/09/2016 0840   CL 105 10/18/2015 0444   CO2 25 02/09/2016 0840   BUN 17.4 02/09/2016 0840   CREATININE 0.8 02/09/2016 0840      Component Value Date/Time   CALCIUM 9.3 02/09/2016 0840   ALKPHOS 80 02/09/2016 0840   AST 41 (H) 02/09/2016 0840   ALT 35 02/09/2016 0840   BILITOT 0.43 02/09/2016 0840       Lab Results  Component Value Date   WBC 4.8 02/09/2016   HGB 11.6 (L) 02/09/2016   HCT 34.6 (L) 02/09/2016   MCV 88.9 02/09/2016   PLT 271 02/09/2016   NEUTROABS 3.2 02/09/2016    ASSESSMENT & PLAN:  Diffuse large B-cell lymphoma of extranodal site ([place])  Small intestine resection, proximal ileum08/10/2015: Involvement by diffuse large B-cell lymphoma,. Biopsy of peritoneal implant: Invol by a DLBCL.positive for CD20, CD79a,CD10, bcl-6, and bcl-2. CD3 and CD5 highlight scattered admixed T-cells. CD34 and TdT neg  PET/CT 11/02/2015: Nodular thickening within the mesenteries of small bowel 17 mm concerning for peritoneal implant of high-grade lymphoma, activity along the abdominal wound with inflammation suspicion for infection/abscess/fistula of bladder Testicular ultrasound:  Normal  Bone marrow biopsy08/20/2017: Hypercellular bone marrow for age 1-60% with trilineage hematopoiesis, plasma cells 9% with Kappa Light chain excess, SPEP 1.5 gm M-Protein Monoclonal paraproteinemia: Bone survey showed bone lytic lesions.Elevated M protein Plan: 1. MRI Rt Hip: No evidence of lytic bone lesions We referred the patient to [place] for evaluation and for a second opinion reg the M protein and lytic lesions. There are opinion was that this was to be treated like MGUS. ----------------------------------------------------------------------------------------------------------------- DLBCL: Prognosis: Revised IPI score: 1 (4 year disease free survival rate: 80%, estimated overall survival 79%) If I use age of less than or equal to 60, revised IPI score 26, 48-year-old  DFS 94%, estimated overall survival 94%  Current treatment: R-CHOP chemotherapy 6 cycles started 11/17/2015  Chemotherapy toxicities: 1.Profound neutropenia grade 3:encouraged him to wear a mask and instructed him on neutropenic precautions 2. Severe fatigue: Grade 2-3 3. Nausea grade 1 4. Chemotherapy induced neuropathy: I will discontinue vincristine for the last 2 rounds of treatment.  I discussed with the patient that Neulasta a standard of care with R CHOP chemotherapy. Our pharmacists and prior authorization specialists are fighting [employer]  Abdominal incision infection: Patient has seen urology and Dr. Rosendo Gros. Ciystogram did not reveal a fistula. It is healing very slowly  Return to clinic for cycle 6   No orders of the defined types were placed in this encounter.  The patient has a good understanding of the overall plan. he agrees with it. he will call with any problems that may develop before the next visit here.   Rulon Eisenmenger, MD 02/09/16

## 2016-02-09 NOTE — Progress Notes (Signed)
Per May RN okay to proceed with treatment at this time.

## 2016-02-09 NOTE — Patient Instructions (Signed)
[facility] Discharge Instructions for Patients Receiving Chemotherapy  Today you received the following chemotherapy agents: Adriamycin, Cytoxan, Rituxan   To help prevent nausea and vomiting after your treatment, we encourage you to take your nausea medication as directed.    If you develop nausea and vomiting that is not controlled by your nausea medication, call the clinic.   BELOW ARE SYMPTOMS THAT SHOULD BE REPORTED IMMEDIATELY:  *FEVER GREATER THAN 100.5 F  *CHILLS WITH OR WITHOUT FEVER  NAUSEA AND VOMITING THAT IS NOT CONTROLLED WITH YOUR NAUSEA MEDICATION  *UNUSUAL SHORTNESS OF BREATH  *UNUSUAL BRUISING OR BLEEDING  TENDERNESS IN MOUTH AND THROAT WITH OR WITHOUT PRESENCE OF ULCERS  *URINARY PROBLEMS  *BOWEL PROBLEMS  UNUSUAL RASH Items with * indicate a potential emergency and should be followed up as soon as possible.  Feel free to call the clinic you have any questions or concerns. The clinic phone number is [phone].  Please show the CHEMO ALERT CARD at check-in to the Emergency Department and triage nurse.   

## 2016-02-09 NOTE — Progress Notes (Signed)
Blood return noted before, every 3 cc during and after Adriamycin push. Pt tolerated treatment well. Pt stable at discharge.

## 2016-03-01 ENCOUNTER — Ambulatory Visit (HOSPITAL_BASED_OUTPATIENT_CLINIC_OR_DEPARTMENT_OTHER): Payer: BLUE CROSS/BLUE SHIELD

## 2016-03-01 ENCOUNTER — Other Ambulatory Visit (HOSPITAL_BASED_OUTPATIENT_CLINIC_OR_DEPARTMENT_OTHER): Payer: BLUE CROSS/BLUE SHIELD

## 2016-03-01 VITALS — BP 112/69 | HR 64 | Temp 97.7°F | Resp 17

## 2016-03-01 DIAGNOSIS — C8339 Diffuse large B-cell lymphoma, extranodal and solid organ sites: Secondary | ICD-10-CM | POA: Diagnosis not present

## 2016-03-01 DIAGNOSIS — Z5189 Encounter for other specified aftercare: Secondary | ICD-10-CM | POA: Diagnosis not present

## 2016-03-01 DIAGNOSIS — Z5111 Encounter for antineoplastic chemotherapy: Secondary | ICD-10-CM | POA: Diagnosis not present

## 2016-03-01 DIAGNOSIS — Z5112 Encounter for antineoplastic immunotherapy: Secondary | ICD-10-CM | POA: Diagnosis not present

## 2016-03-01 LAB — COMPREHENSIVE METABOLIC PANEL
ALBUMIN: 3.7 g/dL (ref 3.5–5.0)
ALK PHOS: 82 U/L (ref 40–150)
ALT: 36 U/L (ref 0–55)
AST: 39 U/L — AB (ref 5–34)
Anion Gap: 6 mEq/L (ref 3–11)
BILIRUBIN TOTAL: 0.46 mg/dL (ref 0.20–1.20)
BUN: 16 mg/dL (ref 7.0–26.0)
CO2: 25 meq/L (ref 22–29)
Calcium: 9.1 mg/dL (ref 8.4–10.4)
Chloride: 106 mEq/L (ref 98–109)
Creatinine: 0.8 mg/dL (ref 0.7–1.3)
GLUCOSE: 101 mg/dL (ref 70–140)
Potassium: 4 mEq/L (ref 3.5–5.1)
SODIUM: 138 meq/L (ref 136–145)
TOTAL PROTEIN: 7.3 g/dL (ref 6.4–8.3)

## 2016-03-01 LAB — CBC WITH DIFFERENTIAL/PLATELET
BASO%: 0.6 % (ref 0.0–2.0)
Basophils Absolute: 0 10*3/uL (ref 0.0–0.1)
EOS%: 0.7 % (ref 0.0–7.0)
Eosinophils Absolute: 0 10*3/uL (ref 0.0–0.5)
HCT: 34.8 % — ABNORMAL LOW (ref 38.4–49.9)
HEMOGLOBIN: 11.6 g/dL — AB (ref 13.0–17.1)
LYMPH%: 26.5 % (ref 14.0–49.0)
MCH: 30.4 pg (ref 27.2–33.4)
MCHC: 33.3 g/dL (ref 32.0–36.0)
MCV: 91.4 fL (ref 79.3–98.0)
MONO#: 0.6 10*3/uL (ref 0.1–0.9)
MONO%: 15.8 % — AB (ref 0.0–14.0)
NEUT%: 56.4 % (ref 39.0–75.0)
NEUTROS ABS: 2 10*3/uL (ref 1.5–6.5)
Platelets: 246 10*3/uL (ref 140–400)
RBC: 3.81 10*6/uL — AB (ref 4.20–5.82)
RDW: 18.9 % — AB (ref 11.0–14.6)
WBC: 3.5 10*3/uL — AB (ref 4.0–10.3)
lymph#: 0.9 10*3/uL (ref 0.9–3.3)

## 2016-03-01 MED ORDER — DIPHENHYDRAMINE HCL 25 MG PO CAPS
50.0000 mg | ORAL_CAPSULE | Freq: Once | ORAL | Status: AC
  Administered 2016-03-01: 50 mg via ORAL

## 2016-03-01 MED ORDER — DOXORUBICIN HCL CHEMO IV INJECTION 2 MG/ML
45.0000 mg/m2 | Freq: Once | INTRAVENOUS | Status: AC
Start: 2016-03-01 — End: 2016-03-01
  Administered 2016-03-01: 92 mg via INTRAVENOUS
  Filled 2016-03-01: qty 46

## 2016-03-01 MED ORDER — SODIUM CHLORIDE 0.9 % IV SOLN
Freq: Once | INTRAVENOUS | Status: AC
  Administered 2016-03-01: 10:00:00 via INTRAVENOUS

## 2016-03-01 MED ORDER — DIPHENHYDRAMINE HCL 25 MG PO CAPS
ORAL_CAPSULE | ORAL | Status: AC
  Filled 2016-03-01: qty 2

## 2016-03-01 MED ORDER — HEPARIN SOD (PORK) LOCK FLUSH 100 UNIT/ML IV SOLN
500.0000 [IU] | Freq: Once | INTRAVENOUS | Status: AC | PRN
  Administered 2016-03-01: 500 [IU]
  Filled 2016-03-01: qty 5

## 2016-03-01 MED ORDER — ACETAMINOPHEN 325 MG PO TABS
ORAL_TABLET | ORAL | Status: AC
  Filled 2016-03-01: qty 2

## 2016-03-01 MED ORDER — DEXAMETHASONE SODIUM PHOSPHATE 10 MG/ML IJ SOLN
10.0000 mg | Freq: Once | INTRAMUSCULAR | Status: AC
  Administered 2016-03-01: 10 mg via INTRAVENOUS

## 2016-03-01 MED ORDER — SODIUM CHLORIDE 0.9 % IV SOLN
375.0000 mg/m2 | Freq: Once | INTRAVENOUS | Status: AC
  Administered 2016-03-01: 800 mg via INTRAVENOUS
  Filled 2016-03-01: qty 30

## 2016-03-01 MED ORDER — PEGFILGRASTIM 6 MG/0.6ML ~~LOC~~ PSKT
6.0000 mg | PREFILLED_SYRINGE | Freq: Once | SUBCUTANEOUS | Status: AC
  Administered 2016-03-01: 6 mg via SUBCUTANEOUS
  Filled 2016-03-01: qty 0.6

## 2016-03-01 MED ORDER — DEXAMETHASONE SODIUM PHOSPHATE 10 MG/ML IJ SOLN
INTRAMUSCULAR | Status: AC
  Filled 2016-03-01: qty 1

## 2016-03-01 MED ORDER — SODIUM CHLORIDE 0.9 % IV SOLN
650.0000 mg/m2 | Freq: Once | INTRAVENOUS | Status: AC
  Administered 2016-03-01: 1320 mg via INTRAVENOUS
  Filled 2016-03-01: qty 66

## 2016-03-01 MED ORDER — PALONOSETRON HCL INJECTION 0.25 MG/5ML
INTRAVENOUS | Status: AC
  Filled 2016-03-01: qty 5

## 2016-03-01 MED ORDER — ACETAMINOPHEN 325 MG PO TABS
650.0000 mg | ORAL_TABLET | Freq: Once | ORAL | Status: AC
  Administered 2016-03-01: 650 mg via ORAL

## 2016-03-01 MED ORDER — SODIUM CHLORIDE 0.9% FLUSH
10.0000 mL | INTRAVENOUS | Status: DC | PRN
  Administered 2016-03-01: 10 mL
  Filled 2016-03-01: qty 10

## 2016-03-01 MED ORDER — PALONOSETRON HCL INJECTION 0.25 MG/5ML
0.2500 mg | Freq: Once | INTRAVENOUS | Status: AC
  Administered 2016-03-01: 0.25 mg via INTRAVENOUS

## 2016-03-01 NOTE — Patient Instructions (Signed)
[facility] Discharge Instructions for Patients Receiving Chemotherapy  Today you received the following chemotherapy agents:  Rituxan, Cytoxan, Doxorubicin  To help prevent nausea and vomiting after your treatment, we encourage you to take your nausea medication as prescribed.   If you develop nausea and vomiting that is not controlled by your nausea medication, call the clinic.   BELOW ARE SYMPTOMS THAT SHOULD BE REPORTED IMMEDIATELY:  *FEVER GREATER THAN 100.5 F  *CHILLS WITH OR WITHOUT FEVER  NAUSEA AND VOMITING THAT IS NOT CONTROLLED WITH YOUR NAUSEA MEDICATION  *UNUSUAL SHORTNESS OF BREATH  *UNUSUAL BRUISING OR BLEEDING  TENDERNESS IN MOUTH AND THROAT WITH OR WITHOUT PRESENCE OF ULCERS  *URINARY PROBLEMS  *BOWEL PROBLEMS  UNUSUAL RASH Items with * indicate a potential emergency and should be followed up as soon as possible.  Feel free to call the clinic you have any questions or concerns. The clinic phone number is [phone].  Please show the [place] at check-in to the Emergency Department and triage nurse.

## 2016-03-04 ENCOUNTER — Encounter: Payer: Self-pay | Admitting: Hematology and Oncology

## 2016-03-15 ENCOUNTER — Other Ambulatory Visit: Payer: Self-pay | Admitting: Emergency Medicine

## 2016-03-15 DIAGNOSIS — C8339 Diffuse large B-cell lymphoma, extranodal and solid organ sites: Secondary | ICD-10-CM

## 2016-03-22 ENCOUNTER — Other Ambulatory Visit: Payer: Self-pay | Admitting: Emergency Medicine

## 2016-03-22 MED ORDER — ALLOPURINOL 300 MG PO TABS
300.0000 mg | ORAL_TABLET | Freq: Every day | ORAL | 3 refills | Status: DC
Start: 2016-03-22 — End: 2016-05-02

## 2016-04-09 ENCOUNTER — Other Ambulatory Visit: Payer: Self-pay | Admitting: Emergency Medicine

## 2016-04-15 ENCOUNTER — Encounter (HOSPITAL_COMMUNITY)
Admission: RE | Admit: 2016-04-15 | Discharge: 2016-04-15 | Disposition: A | Discharge status: Home / Self Care | Payer: BLUE CROSS/BLUE SHIELD | Source: Ambulatory Visit | Attending: Hematology and Oncology | Admitting: Hematology and Oncology

## 2016-04-15 DIAGNOSIS — C8339 Diffuse large B-cell lymphoma, extranodal and solid organ sites: Secondary | ICD-10-CM | POA: Insufficient documentation

## 2016-04-15 DIAGNOSIS — C8330 Diffuse large B-cell lymphoma, unspecified site: Secondary | ICD-10-CM | POA: Diagnosis not present

## 2016-04-15 LAB — GLUCOSE, CAPILLARY: Glucose-Capillary: 107 mg/dL — ABNORMAL HIGH (ref 65–99)

## 2016-04-15 MED ORDER — FLUDEOXYGLUCOSE F - 18 (FDG) INJECTION
9.7000 | Freq: Once | INTRAVENOUS | Status: AC | PRN
  Administered 2016-04-15: 9.7 via INTRAVENOUS

## 2016-04-22 ENCOUNTER — Ambulatory Visit (HOSPITAL_BASED_OUTPATIENT_CLINIC_OR_DEPARTMENT_OTHER): Payer: BLUE CROSS/BLUE SHIELD | Admitting: Hematology and Oncology

## 2016-04-22 ENCOUNTER — Other Ambulatory Visit (HOSPITAL_BASED_OUTPATIENT_CLINIC_OR_DEPARTMENT_OTHER): Payer: BLUE CROSS/BLUE SHIELD

## 2016-04-22 ENCOUNTER — Encounter: Payer: Self-pay | Admitting: Hematology and Oncology

## 2016-04-22 DIAGNOSIS — D472 Monoclonal gammopathy: Secondary | ICD-10-CM

## 2016-04-22 DIAGNOSIS — C8339 Diffuse large B-cell lymphoma, extranodal and solid organ sites: Secondary | ICD-10-CM

## 2016-04-22 LAB — CBC WITH DIFFERENTIAL/PLATELET
BASO%: 0.6 % (ref 0.0–2.0)
BASOS ABS: 0 10*3/uL (ref 0.0–0.1)
EOS%: 5.5 % (ref 0.0–7.0)
Eosinophils Absolute: 0.2 10*3/uL (ref 0.0–0.5)
HEMATOCRIT: 34.6 % — AB (ref 38.4–49.9)
HEMOGLOBIN: 11.8 g/dL — AB (ref 13.0–17.1)
LYMPH#: 0.8 10*3/uL — AB (ref 0.9–3.3)
LYMPH%: 20.9 % (ref 14.0–49.0)
MCH: 30.1 pg (ref 27.2–33.4)
MCHC: 34.2 g/dL (ref 32.0–36.0)
MCV: 88 fL (ref 79.3–98.0)
MONO#: 0.3 10*3/uL (ref 0.1–0.9)
MONO%: 9.2 % (ref 0.0–14.0)
NEUT#: 2.3 10*3/uL (ref 1.5–6.5)
NEUT%: 63.8 % (ref 39.0–75.0)
PLATELETS: 207 10*3/uL (ref 140–400)
RBC: 3.93 10*6/uL — ABNORMAL LOW (ref 4.20–5.82)
RDW: 14.8 % — AB (ref 11.0–14.6)
WBC: 3.6 10*3/uL — ABNORMAL LOW (ref 4.0–10.3)

## 2016-04-22 LAB — COMPREHENSIVE METABOLIC PANEL
ALBUMIN: 3.7 g/dL (ref 3.5–5.0)
ALK PHOS: 86 U/L (ref 40–150)
ALT: 29 U/L (ref 0–55)
ANION GAP: 8 meq/L (ref 3–11)
AST: 34 U/L (ref 5–34)
BUN: 16.7 mg/dL (ref 7.0–26.0)
CALCIUM: 9.3 mg/dL (ref 8.4–10.4)
CO2: 26 mEq/L (ref 22–29)
CREATININE: 0.9 mg/dL (ref 0.7–1.3)
Chloride: 105 mEq/L (ref 98–109)
EGFR: >90 mL/min/{1.73_m2} (ref >90)
Glucose: 106 mg/dl (ref 70–140)
Potassium: 4.2 mEq/L (ref 3.5–5.1)
Sodium: 139 mEq/L (ref 136–145)
Total Bilirubin: 0.5 mg/dL (ref 0.20–1.20)
Total Protein: 7.2 g/dL (ref 6.4–8.3)

## 2016-04-22 LAB — LACTATE DEHYDROGENASE: LDH: 158 U/L (ref 125–245)

## 2016-04-22 NOTE — Progress Notes (Signed)
Patient Care Team: Margo Common, MD as PCP - General (Internal Medicine) Clydene Fake, MD as Referring Physician (Hematology and Oncology)  DIAGNOSIS:  Encounter Diagnosis  Name Primary?  . Diffuse large B-cell lymphoma of extranodal site ([place] Steele)     SUMMARY OF ONCOLOGIC HISTORY:   Diffuse large B-cell lymphoma of extranodal site (Edward Steele)   10/17/2015 Initial Diagnosis    Small intestine resection, proximal ileum: Involvement by diffuse large B-cell lymphoma,. Biopsy of peritoneal implant: Invol by a DLBCL.positive for CD20, CD79a,CD10, bcl-6, and bcl-2. CD3 and CD5 highlight scattered admixed T-cells. CD34 and TdT neg      10/31/2015 Pathology Results    Bone marrow biopsy: Hypercellular bone marrow for age 62-60% with trilineage hematopoiesis, plasma cells 9% with Kappa Light chain excess      11/02/2015 PET scan    Nodular thickening within the mesenteries of small bowel 17 mm concerning for peritoneal implant of high-grade lymphoma, activity along the abdominal wound with inflammation suspicion for infection/abscess/fistula of bladder      11/02/2015 Imaging    Ultrasound scrotum: Normal testes, no epididymitis, no varicocele or hydrocele      11/17/2015 - 03/01/2016 Chemotherapy    R CHOP 6 cycles      04/15/2016 PET scan    No residual abnormal hypermetabolic activity to suggest any residual lymphoma. Prior mesenteric activity has resolved. Metabolic activity left central prostate gland. Retracted left testicle         CHIEF COMPLIANT: Follow-up to review but CT scan  INTERVAL HISTORY: Edward Steele is a 61 year old with above-mentioned history of diffuse large B cell lymphoma who underwent 6 cycles of R CHOP chemotherapy and had a PET/CT scan. He does with that his energy levels are improving. He resumed work and is staying very busy. He still has days where he feels tired. No longer has any abdominal pain. He has frequent urination at nighttime.  REVIEW OF SYSTEMS:     Constitutional: Denies fevers, chills or abnormal weight loss Eyes: Denies blurriness of vision Ears, nose, mouth, throat, and face: Denies mucositis or sore throat Respiratory: Denies cough, dyspnea or wheezes Cardiovascular: Denies palpitation, chest discomfort Gastrointestinal:  Denies nausea, heartburn or change in bowel habits Skin: Denies abnormal skin rashes Lymphatics: Denies new lymphadenopathy or easy bruising Neurological:Denies numbness, tingling or new weaknesses Behavioral/Psych: Mood is stable, no new changes  Extremities: No lower extremity edema All other systems were reviewed with the patient and are negative.  I have reviewed the past medical history, past surgical history, social history and family history with the patient and they are unchanged from previous note.  ALLERGIES:  is allergic to penicillin g.  MEDICATIONS:  Current Outpatient Prescriptions  Medication Sig Dispense Refill  . acetaminophen (TYLENOL) 325 MG tablet Take 650 mg by mouth every 6 (six) hours as needed for mild pain.    Marland Kitchen allopurinol (ZYLOPRIM) 300 MG tablet Take 1 tablet (300 mg total) by mouth daily. 30 tablet 3  . clonazePAM (KLONOPIN) 1 MG tablet Take 1 mg by mouth 2 (two) times daily.    Marland Kitchen DEXILANT 60 MG capsule Take 60 mg by mouth daily.    Marland Kitchen FLUoxetine (PROZAC) 20 MG tablet Take 40 mg by mouth 2 (two) times daily.    Marland Kitchen gabapentin (NEURONTIN) 300 MG capsule Take 300 mg by mouth at bedtime as needed (nerve pain).     Marland Kitchen lidocaine-prilocaine (EMLA) cream Apply to affected area once 30 g 3  . ondansetron (ZOFRAN) 8 MG  tablet Take 1 tablet (8 mg total) by mouth every 8 (eight) hours as needed for refractory nausea / vomiting. (Patient not taking: Reported on 02/05/2016) 30 tablet 1  . oxyCODONE-acetaminophen (ROXICET) 5-325 MG tablet Take 1-2 tablets by mouth every 4 (four) hours as needed. 30 tablet 0  . predniSONE (DELTASONE) 20 MG tablet Take 3 tablets (60 mg total) by mouth daily. Take on  days 1-5 of chemotherapy. 90 tablet 0  . prochlorperazine (COMPAZINE) 10 MG tablet Take 1 tablet (10 mg total) by mouth every 6 (six) hours as needed (Nausea or vomiting). 30 tablet 6  . rosuvastatin (CRESTOR) 5 MG tablet Take 5 mg by mouth daily.    Marland Kitchen zolpidem (AMBIEN) 10 MG tablet Take 1 tablet (10 mg total) by mouth at bedtime as needed for sleep. 30 tablet 3   No current facility-administered medications for this visit.     PHYSICAL EXAMINATION: ECOG PERFORMANCE STATUS: 1 - Symptomatic but completely ambulatory  Vitals:   04/22/16 0909  BP: 115/72  Pulse: 75  Resp: 18  Temp: 97.5 F (36.4 C)   Filed Weights   04/22/16 0909  Weight: 200 lb 12.8 oz (91.1 kg)    GENERAL:alert, no distress and comfortable SKIN: skin color, texture, turgor are normal, no rashes or significant lesions EYES: normal, Conjunctiva are pink and non-injected, sclera clear OROPHARYNX:no exudate, no erythema and lips, buccal mucosa, and tongue normal  NECK: supple, thyroid normal size, non-tender, without nodularity LYMPH:  no palpable lymphadenopathy in the cervical, axillary or inguinal LUNGS: clear to auscultation and percussion with normal breathing effort HEART: regular rate & rhythm and no murmurs and no lower extremity edema ABDOMEN:abdomen soft, non-tender and normal bowel sounds MUSCULOSKELETAL:no cyanosis of digits and no clubbing  NEURO: alert & oriented x 3 with fluent speech, no focal motor/sensory deficits EXTREMITIES: No lower extremity edema  LABORATORY DATA:  I have reviewed the data as listed   Chemistry      Component Value Date/Time   NA 139 04/22/2016 0852   K 4.2 04/22/2016 0852   CL 105 10/18/2015 0444   CO2 26 04/22/2016 0852   BUN 16.7 04/22/2016 0852   CREATININE 0.9 04/22/2016 0852      Component Value Date/Time   CALCIUM 9.3 04/22/2016 0852   ALKPHOS 86 04/22/2016 0852   AST 34 04/22/2016 0852   ALT 29 04/22/2016 0852   BILITOT 0.50 04/22/2016 0852        Lab Results  Component Value Date   WBC 3.6 (L) 04/22/2016   HGB 11.8 (L) 04/22/2016   HCT 34.6 (L) 04/22/2016   MCV 88.0 04/22/2016   PLT 207 04/22/2016   NEUTROABS 2.3 04/22/2016    ASSESSMENT & PLAN:  Diffuse large B-cell lymphoma of extranodal site ([place]) Small intestine resection, proximal ileum08/10/2015: Involvement by diffuse large B-cell lymphoma,. Biopsy of peritoneal implant: Invol by a DLBCL.positive for CD20, CD79a,CD10, bcl-6, and bcl-2. CD3 and CD5 highlight scattered admixed T-cells. CD34 and TdT neg  PET/CT 11/02/2015: Nodular thickening within the mesenteries of small bowel 17 mm concerning for peritoneal implant of high-grade lymphoma, activity along the abdominal wound with inflammation suspicion for infection/abscess/fistula of bladder Testicular ultrasound: Normal  Bone marrow biopsy08/20/2017: Hypercellular bone marrow for age 67-60% with trilineage hematopoiesis, plasma cells 9% with Kappa Light chain excess, SPEP 1.5 gm M-Protein Monoclonal paraproteinemia: Bone survey showed bone lytic lesions.Elevated M protein MRI Rt Hip: Negative Referred to [facility] for evaluation and for a second opinion reg the M protein  and lytic lesions: They agreed that he does not have myeloma ----------------------------------------------------------------------------------------------------------------- DLBCL: Prognosis: Revised IPI score: 1 (4 year disease free survival rate: 80%, estimated overall survival 79%) If I uses age of less than or equal to 60, revised IPI score 50, 37-year-old DFS 94%, estimated overall survival 94%  Treatment summary: R-CHOP chemotherapy 6 cycles started 11/17/2015 completed 03/01/2016 PET/CT scan 04/15/2016:No residual abnormal hypermetabolic activity to suggest any residual lymphoma. Prior mesenteric activity has resolved. Metabolic activity left central prostate gland. Retracted left testicle  Radiology review: I discussed with the patient  PET/CT scan and I gave him a copy of this report.  Surveillance plan: 1. Every 6 month CT chest abdomen pelvis with contrast for 2 years  2. serum protein electrophoresis and Kappa lambda ratio every 6 months 3. Follow with urology regarding the PET/CT findings  Return to clinic in 6 months with labs and CT scans and follow-up.  I spent 25 minutes talking to the patient of which more than half was spent in counseling and coordination of care.  Orders Placed This Encounter  Procedures  . CT Chest W Contrast    Standing Status:   Future    Standing Expiration Date:   04/22/2017    Order Specific Question:   If indicated for the ordered procedure, I authorize the administration of contrast media per Radiology protocol    Answer:   Yes    Order Specific Question:   Reason for Exam (SYMPTOM  OR DIAGNOSIS REQUIRED)    Answer:   Lymphoma restaging    Order Specific Question:   Preferred imaging location?    Answer:   [address] [facility]  . CT Abdomen Pelvis W Contrast    Standing Status:   Future    Standing Expiration Date:   04/22/2017    Order Specific Question:   If indicated for the ordered procedure, I authorize the administration of contrast media per Radiology protocol    Answer:   Yes    Order Specific Question:   Reason for Exam (SYMPTOM  OR DIAGNOSIS REQUIRED)    Answer:   Lymphoma restaging    Order Specific Question:   Preferred imaging location?    Answer:   [facility]  . CBC with Differential    Standing Status:   Future    Standing Expiration Date:   04/22/2017  . Comprehensive metabolic panel    Standing Status:   Future    Standing Expiration Date:   04/22/2017  . Multiple Myeloma Panel (SPEP&IFE w/QIG)    Standing Status:   Future    Standing Expiration Date:   05/27/2017  . Kappa/lambda light chains    Standing Status:   Future    Standing Expiration Date:   05/27/2017  . Lactate dehydrogenase (LDH)    Standing Status:   Future    Standing Expiration  Date:   04/22/2017   The patient has a good understanding of the overall plan. he agrees with it. he will call with any problems that may develop before the next visit here.   Rulon Eisenmenger, MD 04/22/16

## 2016-04-22 NOTE — Assessment & Plan Note (Signed)
Diffuse large B-cell lymphoma of extranodal site [facility]) Small intestine resection, proximal ileum08/10/2015: Involvement by diffuse large B-cell lymphoma,. Biopsy of peritoneal implant: Invol by a DLBCL.positive for CD20, CD79a,CD10, bcl-6, and bcl-2. CD3 and CD5 highlight scattered admixed T-cells. CD34 and TdT neg  PET/CT 11/02/2015: Nodular thickening within the mesenteries of small bowel 17 mm concerning for peritoneal implant of high-grade lymphoma, activity along the abdominal wound with inflammation suspicion for infection/abscess/fistula of bladder Testicular ultrasound: Normal  Bone marrow biopsy08/20/2017: Hypercellular bone marrow for age 36-60% with trilineage hematopoiesis, plasma cells 9% with Kappa Light chain excess, SPEP 1.5 gm M-Protein Monoclonal paraproteinemia: Bone survey showed bone lytic lesions.Elevated M protein MRI Rt Hip: Negative Referred to [facility] for evaluation and for a second opinion reg the M protein and lytic lesions: They agreed that he does not have myeloma ----------------------------------------------------------------------------------------------------------------- DLBCL: Prognosis: Revised IPI score: 1 (4 year disease free survival rate: 80%, estimated overall survival 79%) If I uses age of less than or equal to 60, revised IPI score 23, 24-year-old DFS 94%, estimated overall survival 94%  Treatment summary: R-CHOP chemotherapy 6 cycles started 11/17/2015 completed 03/01/2016 PET/CT scan 04/15/2016:No residual abnormal hypermetabolic activity to suggest any residual lymphoma. Prior mesenteric activity has resolved. Metabolic activity left central prostate gland. Retracted left testicle  Radiology review: I discussed with the patient PET/CT scan and I gave him a copy of this report.  Surveillance plan: 1. Every 6 month CT chest abdomen pelvis with contrast for 2 years  2. serum protein electrophoresis and Kappa lambda ratio every 6 months 3. Follow  with urology and Gen. Surgery  Return to clinic in 6 months with labs and CT scans and follow-up.

## 2016-04-23 ENCOUNTER — Ambulatory Visit: Payer: Self-pay | Admitting: General Surgery

## 2016-04-23 NOTE — H&P (Signed)
Edward Steele is an 61 y.o. male.   Chief Complaint: PAC HPI: patient is a 61 year old male who had a previous diagnostic laparoscopy and small bowel resection for a mass.  This turned out to be B-cell lymphoma.patient underwent chemotherapy.  Patient has been following up with his oncologist.  Patient currently in remission.  At this time has requested the patient's Port-A-Cath be removed.  Past Medical History:  Diagnosis Date  . Anemia    oral iron supplement used  . Anxiety   . Cancer ([place])    skin cancer - face basal and squamous  . Chronic back pain    lower back  . Constipation   . Depression   . GERD (gastroesophageal reflux disease)   . Head injury, closed, with concussion   . History of kidney stones    x 4 episodes-no recent issues  . Pneumonia    age 43  . Recurrent sinus infections    past history-none recent " Inhalers were for this- no use im many yrs"    Past Surgical History:  Procedure Laterality Date  . ABDOMINAL WALL DEFECT REPAIR N/A 11/08/2015   Procedure: EXPLORE LOWER  ABDOMINAL WALL WITH BIOPSY;  Surgeon: Ralene Ok, MD;  Location: [facility];  Service: General;  Laterality: N/A;  . ANTERIOR FUSION CERVICAL SPINE    . BACK SURGERY     x2 lower back with fusion  . BOWEL RESECTION N/A 10/17/2015   Procedure: EXPLORATORY LAPAROTOMY, SMALL BOWEL RESECTION, WITH ANASTAMOSIS;  Surgeon: Ralene Ok, MD;  Location: [facility];  Service: General;  Laterality: N/A;  . CATARACT EXTRACTION, BILATERAL Bilateral   . EYE SURGERY Left    corneal tranplant  . LAPAROSCOPY N/A 10/17/2015   Procedure: LAPAROSCOPY DIAGNOSTIC;  Surgeon: Ralene Ok, MD;  Location: [facility];  Service: General;  Laterality: N/A;  . PORTACATH PLACEMENT N/A 11/08/2015   Procedure: INSERTION PORT-A-CATH;  Surgeon: Ralene Ok, MD;  Location: [place];  Service: General;  Laterality: N/A;    No family history on file. Social History:  reports that he has never smoked. He has never used smokeless  tobacco. He reports that he does not drink alcohol or use drugs.  Allergies:  Allergies  Allergen Reactions  . Penicillin G Hives    Childhood allergy Has patient had a PCN reaction causing immediate rash, facial/tongue/throat swelling, SOB or lightheadedness with hypotension: n/a Has patient had a PCN reaction causing severe rash involving mucus membranes or skin necrosis: n/a Has patient had a PCN reaction that required hospitalization: n/a Has patient had a PCN reaction occurring within the last 10 years:  n/a If all of the above answers are "NO", then may proceed with Cephalosporin use.     (Not in a hospital admission)  Results for orders placed or performed in visit on 04/22/16 (from the past 48 hour(s))  CBC with Differential     Status: Abnormal   Collection Time: 04/22/16  8:52 AM  Result Value Ref Range   WBC 3.6 (L) 4.0 - 10.3 10e3/uL   NEUT# 2.3 1.5 - 6.5 10e3/uL   HGB 11.8 (L) 13.0 - 17.1 g/dL   HCT 34.6 (L) 38.4 - 49.9 %   Platelets 207 140 - 400 10e3/uL   MCV 88.0 79.3 - 98.0 fL   MCH 30.1 27.2 - 33.4 pg   MCHC 34.2 32.0 - 36.0 g/dL   RBC 3.93 (L) 4.20 - 5.82 10e6/uL   RDW 14.8 (H) 11.0 - 14.6 %   lymph# 0.8 (  L) 0.9 - 3.3 10e3/uL   MONO# 0.3 0.1 - 0.9 10e3/uL   Eosinophils Absolute 0.2 0.0 - 0.5 10e3/uL   Basophils Absolute 0.0 0.0 - 0.1 10e3/uL   NEUT% 63.8 39.0 - 75.0 %   LYMPH% 20.9 14.0 - 49.0 %   MONO% 9.2 0.0 - 14.0 %   EOS% 5.5 0.0 - 7.0 %   BASO% 0.6 0.0 - 2.0 %  Comprehensive metabolic panel     Status: None   Collection Time: 04/22/16  8:52 AM  Result Value Ref Range   Sodium 139 136 - 145 mEq/L   Potassium 4.2 3.5 - 5.1 mEq/L   Chloride 105 98 - 109 mEq/L   CO2 26 22 - 29 mEq/L   Glucose 106 70 - 140 mg/dl    Comment: Glucose reference range is for nonfasting patients. Fasting glucose reference range is 70- 100.   BUN 16.7 7.0 - 26.0 mg/dL   Creatinine 0.9 0.7 - 1.3 mg/dL   Total Bilirubin 0.50 0.20 - 1.20 mg/dL   Alkaline Phosphatase 86  40 - 150 U/L   AST 34 5 - 34 U/L   ALT 29 0 - 55 U/L   Total Protein 7.2 6.4 - 8.3 g/dL   Albumin 3.7 3.5 - 5.0 g/dL   Calcium 9.3 8.4 - 10.4 mg/dL   Anion Gap 8 3 - 11 mEq/L   EGFR >90 >90 ml/min/1.73 m2    Comment: eGFR is calculated using the CKD-EPI Creatinine Equation (2009)  Lactate dehydrogenase (LDH)     Status: None   Collection Time: 04/22/16  8:52 AM  Result Value Ref Range   LDH 158 125 - 245 U/L   No results found.  Review of Systems  Constitutional: Negative for chills, fever and weight loss.  HENT: Negative for ear discharge, ear pain, hearing loss and tinnitus.   Eyes: Negative for blurred vision, double vision and photophobia.  Respiratory: Negative for hemoptysis, sputum production and shortness of breath.   Cardiovascular: Negative for chest pain, palpitations and orthopnea.  Gastrointestinal: Negative for abdominal pain, diarrhea, heartburn, nausea and vomiting.  Musculoskeletal: Negative for back pain, myalgias and neck pain.  Neurological: Negative for dizziness, tingling, tremors, sensory change and headaches.  Psychiatric/Behavioral: Negative for hallucinations, substance abuse and suicidal ideas.    There were no vitals taken for this visit. Physical Exam  Constitutional: He is oriented to person, place, and time. He appears well-developed. No distress.  HENT:  Head: Normocephalic and atraumatic.  Eyes: Conjunctivae are normal. Pupils are equal, round, and reactive to light.  Neck: Normal range of motion. Neck supple. No JVD present. No thyromegaly present.  Respiratory: Effort normal and breath sounds normal. No stridor. No respiratory distress. He has no wheezes. He has no rales. He exhibits no tenderness.  GI: Soft. He exhibits no distension and no mass. There is no tenderness. There is no rebound and no guarding.  Musculoskeletal: He exhibits no edema, tenderness or deformity.  Neurological: He is alert and oriented to person, place, and time.   Skin: Skin is warm and dry. He is not diaphoretic.  Psychiatric: He has a normal mood and affect. His behavior is normal.     Assessment/Plan 62 year old male with Port-A-Cath in place, remission of lymphoma.  1.  Will proceed to the ope for excision of Port-A-Cath 2.  I discussed with the patient risks and benefits of the procedure to include but not limited to: Infection, bleeding anddelayed wound healing.  The patient was  understanding and wishes to proceed.  Reyes Ivan, MD 04/23/2016, 9:34 AM

## 2016-04-25 DIAGNOSIS — D509 Iron deficiency anemia, unspecified: Secondary | ICD-10-CM | POA: Diagnosis not present

## 2016-04-25 DIAGNOSIS — E785 Hyperlipidemia, unspecified: Secondary | ICD-10-CM | POA: Diagnosis not present

## 2016-04-25 DIAGNOSIS — Z9221 Personal history of antineoplastic chemotherapy: Secondary | ICD-10-CM | POA: Diagnosis not present

## 2016-04-25 DIAGNOSIS — Z125 Encounter for screening for malignant neoplasm of prostate: Secondary | ICD-10-CM | POA: Diagnosis not present

## 2016-04-30 ENCOUNTER — Other Ambulatory Visit (HOSPITAL_COMMUNITY): Payer: Self-pay | Admitting: *Deleted

## 2016-04-30 ENCOUNTER — Encounter (HOSPITAL_COMMUNITY)
Admission: RE | Admit: 2016-04-30 | Discharge: 2016-04-30 | Disposition: A | Discharge status: Home / Self Care | Payer: BLUE CROSS/BLUE SHIELD | Source: Ambulatory Visit | Attending: General Surgery | Admitting: General Surgery

## 2016-04-30 ENCOUNTER — Encounter (HOSPITAL_COMMUNITY): Payer: Self-pay

## 2016-04-30 DIAGNOSIS — Z01810 Encounter for preprocedural cardiovascular examination: Secondary | ICD-10-CM | POA: Insufficient documentation

## 2016-04-30 DIAGNOSIS — Z6827 Body mass index (BMI) 27.0-27.9, adult: Secondary | ICD-10-CM | POA: Diagnosis not present

## 2016-04-30 DIAGNOSIS — Z85048 Personal history of other malignant neoplasm of rectum, rectosigmoid junction, and anus: Secondary | ICD-10-CM | POA: Diagnosis not present

## 2016-04-30 DIAGNOSIS — K219 Gastro-esophageal reflux disease without esophagitis: Secondary | ICD-10-CM | POA: Diagnosis not present

## 2016-04-30 DIAGNOSIS — Z452 Encounter for adjustment and management of vascular access device: Secondary | ICD-10-CM | POA: Diagnosis not present

## 2016-04-30 DIAGNOSIS — R9431 Abnormal electrocardiogram [ECG] [EKG]: Secondary | ICD-10-CM

## 2016-04-30 DIAGNOSIS — Z9221 Personal history of antineoplastic chemotherapy: Secondary | ICD-10-CM | POA: Diagnosis not present

## 2016-04-30 HISTORY — DX: Keratoconus, unspecified, unspecified eye: H18.609

## 2016-04-30 HISTORY — DX: Non-Hodgkin lymphoma, unspecified, unspecified site: C85.90

## 2016-04-30 NOTE — Patient Instructions (Addendum)
Edward Steele  04/30/2016   Your procedure is scheduled on: 05-02-16  Report to [facility] Main  Entrance take [facility]  elevators to 3rd floor to  [place] at 1150 AM.  Call this number if you have problems the morning of surgery [phone]   Remember: ONLY 1 PERSON MAY GO WITH YOU TO SHORT STAY TO GET  READY MORNING OF [name].  Do not eat food  :After Midnight.CLEAR LIQUIDS FROM MIDNIGHT UNTIL 750 AM DAY OF SURGERY, THEN NOTHING BY MOUTH AFTER 750 AM DAY OF SURGERY.  BRING CONTACT LENS SOLUTION AND CASE   Take these medicines the morning of surgery with A SIP OF WATER: ALBUTERO,INHALER IF NEEDED AND BRING INHALER, CLONAZEPAM, (KLONOPIN), DEXILANT, fluoxetine                                You may not have any metal on your body including hair pins and              piercings  Do not wear jewelry, make-up, lotions, powders or perfumes, deodorant             Do not wear nail polish.  Do not shave  48 hours prior to surgery.              Men may shave face and neck.   Do not bring valuables to the hospital. [name].  Contacts, dentures or bridgework may not be worn into surgery.  Leave suitcase in the car. After surgery it may be brought to your room.     Patients discharged the day of surgery will not be allowed to drive home.  Name and phone number of your driver:wife susan cell [phone]  Special Instructions: N/A              Please read over the following fact sheets you were given: _____________________________________________________________________                CLEAR LIQUID DIET   Foods Allowed                                                                     Foods Excluded  Coffee and tea, regular and decaf                             liquids that you cannot  Plain Jell-O in any flavor                                             see through such as: Fruit ices (not with  fruit pulp)                                     milk, soups, orange  juice  Iced Popsicles                                    All solid food Carbonated beverages, regular and diet                                    Cranberry, grape and apple juices Sports drinks like Gatorade Lightly seasoned clear broth or consume(fat free) Sugar, honey syrup  Sample Menu Breakfast                                Lunch                                     Supper Cranberry juice                    Beef broth                            Chicken broth Jell-O                                     Grape juice                           Apple juice Coffee or tea                        Jell-O                                      Popsicle                                                Coffee or tea                        Coffee or tea  _____________________________________________________________________  [name] - Preparing for Surgery Before surgery, you can play an important role.  Because skin is not sterile, your skin needs to be as free of germs as possible.  You can reduce the number of germs on your skin by washing with CHG (chlorahexidine gluconate) soap before surgery.  CHG is an antiseptic cleaner which kills germs and bonds with the skin to continue killing germs even after washing. Please DO NOT use if you have an allergy to CHG or antibacterial soaps.  If your skin becomes reddened/irritated stop using the CHG and inform your nurse when you arrive at Short Stay. Do not shave (including legs and underarms) for at least 48 hours prior to the first CHG shower.  You may shave your face/neck. Please follow these instructions carefully:  1.  Shower with CHG Soap the night before surgery and the  morning of Surgery.  2.  If you choose to wash your hair, wash  your hair first as usual with your  normal  shampoo.  3.  After you shampoo, rinse your hair and body thoroughly to remove the  shampoo.                            4.  Use CHG as you would any other liquid soap.  You can apply chg directly  to the skin and wash                       Gently with a scrungie or clean washcloth.  5.  Apply the CHG Soap to your body ONLY FROM THE NECK DOWN.   Do not use on face/ open                           Wound or open sores. Avoid contact with eyes, ears mouth and genitals (private parts).                       Wash face,  Genitals (private parts) with your normal soap.             6.  Wash thoroughly, paying special attention to the area where your surgery  will be performed.  7.  Thoroughly rinse your body with warm water from the neck down.  8.  DO NOT shower/wash with your normal soap after using and rinsing off  the CHG Soap.                9.  Pat yourself dry with a clean towel.            10.  Wear clean pajamas.            11.  Place clean sheets on your bed the night of your first shower and do not  sleep with pets. Day of Surgery : Do not apply any lotions/deodorants the morning of surgery.  Please wear clean clothes to the hospital/surgery center.  FAILURE TO FOLLOW THESE INSTRUCTIONS MAY RESULT IN THE CANCELLATION OF YOUR SURGERY PATIENT SIGNATURE_________________________________  NURSE SIGNATURE__________________________________  ________________________________________________________________________

## 2016-04-30 NOTE — Progress Notes (Signed)
CHEST XRAY 02-05-16 EPIC CBC WITH DIF, CMET 2-118 EPIC ECHO 02-05-16 EPIC

## 2016-05-02 ENCOUNTER — Encounter (HOSPITAL_COMMUNITY): Payer: Self-pay | Admitting: *Deleted

## 2016-05-02 ENCOUNTER — Ambulatory Visit (HOSPITAL_COMMUNITY): Payer: BLUE CROSS/BLUE SHIELD | Admitting: Anesthesiology

## 2016-05-02 ENCOUNTER — Encounter (HOSPITAL_COMMUNITY)
Admission: RE | Disposition: A | Discharge status: Home / Self Care | Payer: Self-pay | Source: Ambulatory Visit | Attending: General Surgery

## 2016-05-02 ENCOUNTER — Ambulatory Visit (HOSPITAL_COMMUNITY)
Admission: RE | Admit: 2016-05-02 | Discharge: 2016-05-02 | Disposition: A | Discharge status: Home / Self Care | Payer: BLUE CROSS/BLUE SHIELD | Source: Ambulatory Visit | Attending: General Surgery | Admitting: General Surgery

## 2016-05-02 DIAGNOSIS — K219 Gastro-esophageal reflux disease without esophagitis: Secondary | ICD-10-CM | POA: Insufficient documentation

## 2016-05-02 DIAGNOSIS — Z85048 Personal history of other malignant neoplasm of rectum, rectosigmoid junction, and anus: Secondary | ICD-10-CM | POA: Insufficient documentation

## 2016-05-02 DIAGNOSIS — Z6827 Body mass index (BMI) 27.0-27.9, adult: Secondary | ICD-10-CM | POA: Diagnosis not present

## 2016-05-02 DIAGNOSIS — Z452 Encounter for adjustment and management of vascular access device: Secondary | ICD-10-CM | POA: Diagnosis not present

## 2016-05-02 DIAGNOSIS — Z9221 Personal history of antineoplastic chemotherapy: Secondary | ICD-10-CM | POA: Insufficient documentation

## 2016-05-02 HISTORY — PX: PORT-A-CATH REMOVAL: SHX5289

## 2016-05-02 SURGERY — REMOVAL PORT-A-CATH
Anesthesia: Monitor Anesthesia Care | Laterality: Left

## 2016-05-02 MED ORDER — ACETAMINOPHEN 325 MG PO TABS: 650.0000 mg | ORAL_TABLET | ORAL | Status: DC | PRN

## 2016-05-02 MED ORDER — SODIUM CHLORIDE 0.9 % IJ SOLN
INTRAMUSCULAR | Status: AC
  Filled 2016-05-02: qty 10

## 2016-05-02 MED ORDER — BUPIVACAINE HCL (PF) 0.25 % IJ SOLN
INTRAMUSCULAR | Status: AC
  Filled 2016-05-02: qty 30

## 2016-05-02 MED ORDER — VANCOMYCIN HCL IN DEXTROSE 1-5 GM/200ML-% IV SOLN
1000.0000 mg | INTRAVENOUS | Status: AC
  Administered 2016-05-02: 1000 mg via INTRAVENOUS
  Filled 2016-05-02: qty 200

## 2016-05-02 MED ORDER — MIDAZOLAM HCL 2 MG/2ML IJ SOLN
INTRAMUSCULAR | Status: AC
  Filled 2016-05-02: qty 2

## 2016-05-02 MED ORDER — MEPERIDINE HCL 50 MG/ML IJ SOLN: 6.2500 mg | INTRAMUSCULAR | Status: DC | PRN

## 2016-05-02 MED ORDER — PROPOFOL 500 MG/50ML IV EMUL
INTRAVENOUS | Status: DC | PRN
  Administered 2016-05-02: 20 mg via INTRAVENOUS

## 2016-05-02 MED ORDER — MIDAZOLAM HCL 5 MG/5ML IJ SOLN
INTRAMUSCULAR | Status: DC | PRN
  Administered 2016-05-02: 2 mg via INTRAVENOUS

## 2016-05-02 MED ORDER — 0.9 % SODIUM CHLORIDE (POUR BTL) OPTIME
TOPICAL | Status: DC | PRN
  Administered 2016-05-02: 1000 mL

## 2016-05-02 MED ORDER — EPHEDRINE 5 MG/ML INJ
INTRAVENOUS | Status: AC
  Filled 2016-05-02: qty 30

## 2016-05-02 MED ORDER — OXYCODONE-ACETAMINOPHEN 5-325 MG PO TABS: 1.0000 | ORAL_TABLET | ORAL | 0 refills | Status: DC | PRN

## 2016-05-02 MED ORDER — FENTANYL CITRATE (PF) 100 MCG/2ML IJ SOLN
25.0000 ug | INTRAMUSCULAR | Status: DC | PRN
  Administered 2016-05-02: 50 ug via INTRAVENOUS
  Administered 2016-05-02 (×2): 25 ug via INTRAVENOUS
  Administered 2016-05-02: 50 ug via INTRAVENOUS

## 2016-05-02 MED ORDER — FENTANYL CITRATE (PF) 100 MCG/2ML IJ SOLN
INTRAMUSCULAR | Status: AC
  Administered 2016-05-02: 25 ug via INTRAVENOUS
  Filled 2016-05-02: qty 2

## 2016-05-02 MED ORDER — PROMETHAZINE HCL 25 MG/ML IJ SOLN: 6.2500 mg | INTRAMUSCULAR | Status: DC | PRN

## 2016-05-02 MED ORDER — PROPOFOL 10 MG/ML IV BOLUS
INTRAVENOUS | Status: AC
  Filled 2016-05-02: qty 40

## 2016-05-02 MED ORDER — MIDAZOLAM HCL 2 MG/2ML IJ SOLN: 0.5000 mg | Freq: Once | INTRAMUSCULAR | Status: DC | PRN

## 2016-05-02 MED ORDER — BUPIVACAINE HCL (PF) 0.25 % IJ SOLN
INTRAMUSCULAR | Status: DC | PRN
  Administered 2016-05-02: 8 mL

## 2016-05-02 MED ORDER — SODIUM CHLORIDE 0.9% FLUSH: 3.0000 mL | INTRAVENOUS | Status: DC | PRN

## 2016-05-02 MED ORDER — SODIUM CHLORIDE 0.9% FLUSH: 3.0000 mL | Freq: Two times a day (BID) | INTRAVENOUS | Status: DC

## 2016-05-02 MED ORDER — SUGAMMADEX SODIUM 500 MG/5ML IV SOLN
INTRAVENOUS | Status: AC
  Filled 2016-05-02: qty 5

## 2016-05-02 MED ORDER — FENTANYL CITRATE (PF) 100 MCG/2ML IJ SOLN
INTRAMUSCULAR | Status: AC
  Filled 2016-05-02: qty 2

## 2016-05-02 MED ORDER — MORPHINE SULFATE (PF) 10 MG/ML IV SOLN: 2.0000 mg | INTRAVENOUS | Status: DC | PRN

## 2016-05-02 MED ORDER — SODIUM CHLORIDE 0.9 % IV SOLN: 250.0000 mL | INTRAVENOUS | Status: DC | PRN

## 2016-05-02 MED ORDER — OXYCODONE HCL 5 MG PO TABS: 5.0000 mg | ORAL_TABLET | ORAL | Status: DC | PRN

## 2016-05-02 MED ORDER — SUFENTANIL CITRATE 50 MCG/ML IV SOLN
INTRAVENOUS | Status: AC
  Filled 2016-05-02: qty 1

## 2016-05-02 MED ORDER — ACETAMINOPHEN 650 MG RE SUPP
650.0000 mg | RECTAL | Status: DC | PRN
  Filled 2016-05-02: qty 1

## 2016-05-02 MED ORDER — LACTATED RINGERS IV SOLN
INTRAVENOUS | Status: DC
  Administered 2016-05-02: 13:00:00 via INTRAVENOUS

## 2016-05-02 MED ORDER — SUFENTANIL CITRATE 50 MCG/ML IV SOLN
INTRAVENOUS | Status: DC | PRN
  Administered 2016-05-02: 5 ug via INTRAVENOUS

## 2016-05-02 MED ORDER — ONDANSETRON HCL 4 MG/2ML IJ SOLN
INTRAMUSCULAR | Status: DC | PRN
  Administered 2016-05-02: 4 mg via INTRAVENOUS

## 2016-05-02 MED ORDER — PROPOFOL 500 MG/50ML IV EMUL
INTRAVENOUS | Status: DC | PRN
  Administered 2016-05-02: 150 ug/kg/min via INTRAVENOUS

## 2016-05-02 SURGICAL SUPPLY — 26 items
BLADE HEX COATED 2.75 (ELECTRODE) IMPLANT
BLADE SURG 15 STRL LF DISP TIS (BLADE) ×1 IMPLANT
BLADE SURG 15 STRL SS (BLADE) ×2
CHLORAPREP W/TINT 26ML (MISCELLANEOUS) ×3 IMPLANT
COVER SURGICAL LIGHT HANDLE (MISCELLANEOUS) IMPLANT
DECANTER SPIKE VIAL GLASS SM (MISCELLANEOUS) IMPLANT
DERMABOND ADVANCED (GAUZE/BANDAGES/DRESSINGS) ×2
DERMABOND ADVANCED .7 DNX12 (GAUZE/BANDAGES/DRESSINGS) ×1 IMPLANT
DRAPE LAPAROTOMY TRNSV 102X78 (DRAPE) ×3 IMPLANT
ELECT PENCIL ROCKER SW 15FT (MISCELLANEOUS) ×3 IMPLANT
ELECT REM PT RETURN 9FT ADLT (ELECTROSURGICAL) ×3
ELECTRODE REM PT RTRN 9FT ADLT (ELECTROSURGICAL) ×1 IMPLANT
GAUZE SPONGE 4X4 12PLY STRL (GAUZE/BANDAGES/DRESSINGS) IMPLANT
GAUZE SPONGE 4X4 16PLY XRAY LF (GAUZE/BANDAGES/DRESSINGS) ×3 IMPLANT
GLOVE BIO SURGEON STRL SZ7.5 (GLOVE) ×3 IMPLANT
GOWN STRL REUS W/TWL XL LVL3 (GOWN DISPOSABLE) ×6 IMPLANT
KIT BASIN OR (CUSTOM PROCEDURE TRAY) ×3 IMPLANT
NEEDLE HYPO 25X1 1.5 SAFETY (NEEDLE) ×3 IMPLANT
PACK BASIC VI WITH GOWN DISP (CUSTOM PROCEDURE TRAY) ×3 IMPLANT
SUT MNCRL AB 4-0 PS2 18 (SUTURE) ×3 IMPLANT
SUT SILK 2 0 SH (SUTURE) ×3 IMPLANT
SUT VIC AB 3-0 SH 27 (SUTURE) ×2
SUT VIC AB 3-0 SH 27XBRD (SUTURE) ×1 IMPLANT
SYR CONTROL 10ML LL (SYRINGE) ×3 IMPLANT
TOWEL OR 17X26 10 PK STRL BLUE (TOWEL DISPOSABLE) ×3 IMPLANT
TOWEL OR NON WOVEN STRL DISP B (DISPOSABLE) ×3 IMPLANT

## 2016-05-02 NOTE — Anesthesia Postprocedure Evaluation (Signed)
Anesthesia Post Note  Patient: [name]  Procedure(s) Performed: Procedure(s) (LRB): REMOVAL PORT-A-CATH (Left)  Patient location during evaluation: PACU Anesthesia Type: MAC Level of consciousness: awake and alert Pain management: pain level controlled Vital Signs Assessment: post-procedure vital signs reviewed and stable Respiratory status: spontaneous breathing, nonlabored ventilation, respiratory function stable and patient connected to nasal cannula oxygen Cardiovascular status: stable and blood pressure returned to baseline Anesthetic complications: no       Last Vitals:  Vitals:   05/02/16 1415 05/02/16 1430  BP: 113/74 125/80  Pulse: (!) 59 65  Resp: (!) 9 13  Temp:      Last Pain:  Vitals:   05/02/16 1430  TempSrc:   PainSc: 5                  Nestor Wieneke S

## 2016-05-02 NOTE — Discharge Instructions (Signed)
You have skin glue over your incision. Do not scratch it. Use ice over incision to help prevent bruising. Report any redness, drainage, or signs of infection to Dr Rosendo Gros.        General Anesthesia, Adult, Care After These instructions provide you with information about caring for yourself after your procedure. Your health care provider may also give you more specific instructions. Your treatment has been planned according to current medical practices, but problems sometimes occur. Call your health care provider if you have any problems or questions after your procedure. What can I expect after the procedure? After the procedure, it is common to have:  Vomiting.  A sore throat.  Mental slowness. It is common to feel:  Nauseous.  Cold or shivery.  Sleepy.  Tired.  Sore or achy, even in parts of your body where you did not have surgery. Follow these instructions at home: For at least 24 hours after the procedure:  Do not:  Participate in activities where you could fall or become injured.  Drive.  Use heavy machinery.  Drink alcohol.  Take sleeping pills or medicines that cause drowsiness.  Make important decisions or sign legal documents.  Take care of children on your own.  Rest. Eating and drinking  If you vomit, drink water, juice, or soup when you can drink without vomiting.  Drink enough fluid to keep your urine clear or pale yellow.  Make sure you have little or no nausea before eating solid foods.  Follow the diet recommended by your health care provider. General instructions  Have a responsible adult stay with you until you are awake and alert.  Return to your normal activities as told by your health care provider. Ask your health care provider what activities are safe for you.  Take over-the-counter and prescription medicines only as told by your health care provider.  If you smoke, do not smoke without supervision.  Keep all follow-up visits  as told by your health care provider. This is important. Contact a health care provider if:  You continue to have nausea or vomiting at home, and medicines are not helpful.  You cannot drink fluids or start eating again.  You cannot urinate after 8-12 hours.  You develop a skin rash.  You have fever.  You have increasing redness at the site of your procedure. Get help right away if:  You have difficulty breathing.  You have chest pain.  You have unexpected bleeding.  You feel that you are having a life-threatening or urgent problem. This information is not intended to replace advice given to you by your health care provider. Make sure you discuss any questions you have with your health care provider. Document Released: 06/03/2000 Document Revised: 07/31/2015 Document Reviewed: 02/09/2015 Elsevier Interactive Patient Education  2017 [employer].

## 2016-05-02 NOTE — Anesthesia Preprocedure Evaluation (Addendum)
Anesthesia Evaluation  Patient identified by MRN, date of birth, ID band Patient awake    Reviewed: NPO status , Patient's Chart, lab work & pertinent test results  History of Anesthesia Complications Negative for: history of anesthetic complications  Airway Mallampati: II  TM Distance: >3 FB Neck ROM: Full    Dental  (+) Dental Advisory Given, Caps   Pulmonary asthma ,    breath sounds clear to auscultation       Cardiovascular (-) hypertension(-) angina Rhythm:Regular Rate:Normal  '17 ECHO: ECHO: EF 60-65%, valves OK   Neuro/Psych negative neurological ROS     GI/Hepatic Neg liver ROS, GERD  Medicated and Controlled,  Endo/Other  Morbid obesity  Renal/GU negative Renal ROS     Musculoskeletal   Abdominal (+) + obese,   Peds  Hematology Lymphoma: chemo   Anesthesia Other Findings   Reproductive/Obstetrics                            Anesthesia Physical Anesthesia Plan  ASA: III  Anesthesia Plan: MAC   Post-op Pain Management:    Induction: Intravenous  Airway Management Planned: Natural Airway and Nasal Cannula  Additional Equipment:   Intra-op Plan:   Post-operative Plan:   Informed Consent: I have reviewed the patients History and Physical, chart, labs and discussed the procedure including the risks, benefits and alternatives for the proposed anesthesia with the patient or authorized representative who has indicated his/her understanding and acceptance.   Dental advisory given  Plan Discussed with: CRNA and Surgeon  Anesthesia Plan Comments: (Plan routine monitors, MAC)        Anesthesia Quick Evaluation

## 2016-05-02 NOTE — Transfer of Care (Signed)
Immediate Anesthesia Transfer of Care Note  Patient: [name]  Procedure(s) Performed: Procedure(s): REMOVAL PORT-A-CATH (Left)  Patient Location: PACU  Anesthesia Type:MAC  Level of Consciousness:  sedated, patient cooperative and responds to stimulation  Airway & Oxygen Therapy:Patient Spontanous Breathing and Patient connected to face mask oxgen  Post-op Assessment:  Report given to PACU RN and Post -op Vital signs reviewed and stable  Post vital signs:  Reviewed and stable  Last Vitals:  Vitals:   05/02/16 1115  BP: 117/84  Pulse: 73  Resp: 18  Temp: 08.1 C    Complications: No apparent anesthesia complications

## 2016-05-02 NOTE — Interval H&P Note (Signed)
History and Physical Interval Note:  05/02/2016 1:15 PM  Edward Steele  has presented today for surgery, with the diagnosis of Port-A-Cath  The various methods of treatment have been discussed with the patient and family. After consideration of risks, benefits and other options for treatment, the patient has consented to  Procedure(s): REMOVAL PORT-A-CATH (N/A) as a surgical intervention .  The patient's history has been reviewed, patient examined, no change in status, stable for surgery.  I have reviewed the patient's chart and labs.  Questions were answered to the patient's satisfaction.     Rosario Jacks., Anne Hahn

## 2016-05-02 NOTE — Op Note (Signed)
05/02/2016  2:06 PM  PATIENT:  Edward Steele  61 y.o. male  PRE-OPERATIVE DIAGNOSIS:  Port-A-Cath  POST-OPERATIVE DIAGNOSIS:  Port-A-Cath  PROCEDURE:  Procedure(s): REMOVAL PORT-A-CATH (Left)  SURGEON:  Surgeon(s) and Role:    * Ralene Ok, MD - Primary  ANESTHESIA:   local and general  EBL:<5cc  BLOOD ADMINISTERED:none  DRAINS: none   LOCAL MEDICATIONS USED:  BUPIVICAINE   SPECIMEN:  No Specimen  DISPOSITION OF SPECIMEN:  N/A  COUNTS:  YES  TOURNIQUET:  * No tourniquets in log *  DICTATION: .Dragon Dictation  Indication for procedure: Patient is a 61 year old male with previous history of rectal cancer. Patient underwent Port-A-Cath placement for chemotherapy. Patient was followed up with oncology in secondary to the completion of chemotherapy requested Port-A-Cath removed.  Details procedure: At the patient was consented he was taken back to the OR and placed in the supine position with bilateral SCDs in place. Patient underwent Mac anesthesia.  A timeout was called all facts verified.  The area of the previous incision was infiltrated with quarter percent Marcaine. A 15 blade was used to make an incision over the previous incision site. Sharp dressing was taken down to the catheter site. This was then circumferentially dissected away. The tunnel tract was identified. 2-0 silk was then placed around the tunnel tract. The catheter was removed from the subclavian vein in its entirety. At the same time a 2-0 silk was then used and tied down to obliterate the tract. At this time the Prolenes were injury down the port were removed. The port was easily extracted. At this time the pocket of the port was cauterized to help scar tissue. The area was irrigated out with sterile saline. At this time a 2-0 Vicryl was then used to reapproximate the port pocket. Once this was done the incision site was reapproximated using the 2-0 Vicryl interrupted standard fashion. The skin was then  reapproximated using a 4-0 Monocryl subcuticular fashion. Skin was dressed Dermabond. The patient tied the procedure well was taken to the recovery stable condition.-.    PLAN OF CARE: Discharge to home after PACU  PATIENT DISPOSITION:  PACU - hemodynamically stable.   Delay start of Pharmacological VTE agent (>24hrs) due to surgical blood loss or risk of bleeding: not applicable

## 2016-05-02 NOTE — H&P (View-Only) (Signed)
Edward Steele is an 61 y.o. male.   Chief Complaint: PAC HPI: patient is a 61 year old male who had a previous diagnostic laparoscopy and small bowel resection for a mass.  This turned out to be B-cell lymphoma.patient underwent chemotherapy.  Patient has been following up with his oncologist.  Patient currently in remission.  At this time has requested the patient's Port-A-Cath be removed.  Past Medical History:  Diagnosis Date  . Anemia    oral iron supplement used  . Anxiety   . Cancer ([place])    skin cancer - face basal and squamous  . Chronic back pain    lower back  . Constipation   . Depression   . GERD (gastroesophageal reflux disease)   . Head injury, closed, with concussion   . History of kidney stones    x 4 episodes-no recent issues  . Pneumonia    age 24  . Recurrent sinus infections    past history-none recent " Inhalers were for this- no use im many yrs"    Past Surgical History:  Procedure Laterality Date  . ABDOMINAL WALL DEFECT REPAIR N/A 11/08/2015   Procedure: EXPLORE LOWER  ABDOMINAL WALL WITH BIOPSY;  Surgeon: Ralene Ok, MD;  Location: [facility];  Service: General;  Laterality: N/A;  . ANTERIOR FUSION CERVICAL SPINE    . BACK SURGERY     x2 lower back with fusion  . BOWEL RESECTION N/A 10/17/2015   Procedure: EXPLORATORY LAPAROTOMY, SMALL BOWEL RESECTION, WITH ANASTAMOSIS;  Surgeon: Ralene Ok, MD;  Location: [facility];  Service: General;  Laterality: N/A;  . CATARACT EXTRACTION, BILATERAL Bilateral   . EYE SURGERY Left    corneal tranplant  . LAPAROSCOPY N/A 10/17/2015   Procedure: LAPAROSCOPY DIAGNOSTIC;  Surgeon: Ralene Ok, MD;  Location: [facility];  Service: General;  Laterality: N/A;  . PORTACATH PLACEMENT N/A 11/08/2015   Procedure: INSERTION PORT-A-CATH;  Surgeon: Ralene Ok, MD;  Location: [facility];  Service: General;  Laterality: N/A;    No family history on file. Social History:  reports that he has never smoked. He has never used smokeless  tobacco. He reports that he does not drink alcohol or use drugs.  Allergies:  Allergies  Allergen Reactions  . Penicillin G Hives    Childhood allergy Has patient had a PCN reaction causing immediate rash, facial/tongue/throat swelling, SOB or lightheadedness with hypotension: n/a Has patient had a PCN reaction causing severe rash involving mucus membranes or skin necrosis: n/a Has patient had a PCN reaction that required hospitalization: n/a Has patient had a PCN reaction occurring within the last 10 years:  n/a If all of the above answers are "NO", then may proceed with Cephalosporin use.     (Not in a hospital admission)  Results for orders placed or performed in visit on 04/22/16 (from the past 48 hour(s))  CBC with Differential     Status: Abnormal   Collection Time: 04/22/16  8:52 AM  Result Value Ref Range   WBC 3.6 (L) 4.0 - 10.3 10e3/uL   NEUT# 2.3 1.5 - 6.5 10e3/uL   HGB 11.8 (L) 13.0 - 17.1 g/dL   HCT 34.6 (L) 38.4 - 49.9 %   Platelets 207 140 - 400 10e3/uL   MCV 88.0 79.3 - 98.0 fL   MCH 30.1 27.2 - 33.4 pg   MCHC 34.2 32.0 - 36.0 g/dL   RBC 3.93 (L) 4.20 - 5.82 10e6/uL   RDW 14.8 (H) 11.0 - 14.6 %   lymph# 0.8 (  L) 0.9 - 3.3 10e3/uL   MONO# 0.3 0.1 - 0.9 10e3/uL   Eosinophils Absolute 0.2 0.0 - 0.5 10e3/uL   Basophils Absolute 0.0 0.0 - 0.1 10e3/uL   NEUT% 63.8 39.0 - 75.0 %   LYMPH% 20.9 14.0 - 49.0 %   MONO% 9.2 0.0 - 14.0 %   EOS% 5.5 0.0 - 7.0 %   BASO% 0.6 0.0 - 2.0 %  Comprehensive metabolic panel     Status: None   Collection Time: 04/22/16  8:52 AM  Result Value Ref Range   Sodium 139 136 - 145 mEq/L   Potassium 4.2 3.5 - 5.1 mEq/L   Chloride 105 98 - 109 mEq/L   CO2 26 22 - 29 mEq/L   Glucose 106 70 - 140 mg/dl    Comment: Glucose reference range is for nonfasting patients. Fasting glucose reference range is 70- 100.   BUN 16.7 7.0 - 26.0 mg/dL   Creatinine 0.9 0.7 - 1.3 mg/dL   Total Bilirubin 0.50 0.20 - 1.20 mg/dL   Alkaline Phosphatase 86  40 - 150 U/L   AST 34 5 - 34 U/L   ALT 29 0 - 55 U/L   Total Protein 7.2 6.4 - 8.3 g/dL   Albumin 3.7 3.5 - 5.0 g/dL   Calcium 9.3 8.4 - 10.4 mg/dL   Anion Gap 8 3 - 11 mEq/L   EGFR >90 >90 ml/min/1.73 m2    Comment: eGFR is calculated using the CKD-EPI Creatinine Equation (2009)  Lactate dehydrogenase (LDH)     Status: None   Collection Time: 04/22/16  8:52 AM  Result Value Ref Range   LDH 158 125 - 245 U/L   No results found.  Review of Systems  Constitutional: Negative for chills, fever and weight loss.  HENT: Negative for ear discharge, ear pain, hearing loss and tinnitus.   Eyes: Negative for blurred vision, double vision and photophobia.  Respiratory: Negative for hemoptysis, sputum production and shortness of breath.   Cardiovascular: Negative for chest pain, palpitations and orthopnea.  Gastrointestinal: Negative for abdominal pain, diarrhea, heartburn, nausea and vomiting.  Musculoskeletal: Negative for back pain, myalgias and neck pain.  Neurological: Negative for dizziness, tingling, tremors, sensory change and headaches.  Psychiatric/Behavioral: Negative for hallucinations, substance abuse and suicidal ideas.    There were no vitals taken for this visit. Physical Exam  Constitutional: He is oriented to person, place, and time. He appears well-developed. No distress.  HENT:  Head: Normocephalic and atraumatic.  Eyes: Conjunctivae are normal. Pupils are equal, round, and reactive to light.  Neck: Normal range of motion. Neck supple. No JVD present. No thyromegaly present.  Respiratory: Effort normal and breath sounds normal. No stridor. No respiratory distress. He has no wheezes. He has no rales. He exhibits no tenderness.  GI: Soft. He exhibits no distension and no mass. There is no tenderness. There is no rebound and no guarding.  Musculoskeletal: He exhibits no edema, tenderness or deformity.  Neurological: He is alert and oriented to person, place, and time.   Skin: Skin is warm and dry. He is not diaphoretic.  Psychiatric: He has a normal mood and affect. His behavior is normal.     Assessment/Plan 61 year old male with Port-A-Cath in place, remission of lymphoma.  1.  Will proceed to the ope for excision of Port-A-Cath 2.  I discussed with the patient risks and benefits of the procedure to include but not limited to: Infection, bleeding anddelayed wound healing.  The patient was  understanding and wishes to proceed.  Reyes Ivan, MD 04/23/2016, 9:34 AM

## 2016-05-03 ENCOUNTER — Encounter (HOSPITAL_COMMUNITY): Payer: Self-pay | Admitting: General Surgery

## 2016-05-13 DIAGNOSIS — Q539 Undescended testicle, unspecified: Secondary | ICD-10-CM | POA: Diagnosis not present

## 2016-05-13 DIAGNOSIS — S3720XA Unspecified injury of bladder, initial encounter: Secondary | ICD-10-CM | POA: Diagnosis not present

## 2016-07-08 ENCOUNTER — Encounter: Payer: Self-pay | Admitting: Hematology and Oncology

## 2016-07-09 ENCOUNTER — Other Ambulatory Visit: Payer: Self-pay | Admitting: Emergency Medicine

## 2016-07-09 DIAGNOSIS — C8339 Diffuse large B-cell lymphoma, extranodal and solid organ sites: Secondary | ICD-10-CM

## 2016-07-24 ENCOUNTER — Other Ambulatory Visit: Payer: Self-pay | Admitting: Emergency Medicine

## 2016-07-24 ENCOUNTER — Other Ambulatory Visit (HOSPITAL_BASED_OUTPATIENT_CLINIC_OR_DEPARTMENT_OTHER): Payer: BLUE CROSS/BLUE SHIELD

## 2016-07-24 DIAGNOSIS — D472 Monoclonal gammopathy: Secondary | ICD-10-CM | POA: Diagnosis not present

## 2016-07-24 DIAGNOSIS — C8339 Diffuse large B-cell lymphoma, extranodal and solid organ sites: Secondary | ICD-10-CM

## 2016-07-24 LAB — CBC WITH DIFFERENTIAL/PLATELET
BASO%: 0.4 % (ref 0.0–2.0)
Basophils Absolute: 0 10*3/uL (ref 0.0–0.1)
EOS ABS: 0.1 10*3/uL (ref 0.0–0.5)
EOS%: 1.7 % (ref 0.0–7.0)
HCT: 39.9 % (ref 38.4–49.9)
HGB: 13.7 g/dL (ref 13.0–17.1)
LYMPH%: 33.5 % (ref 14.0–49.0)
MCH: 29.6 pg (ref 27.2–33.4)
MCHC: 34.2 g/dL (ref 32.0–36.0)
MCV: 86.5 fL (ref 79.3–98.0)
MONO#: 0.4 10*3/uL (ref 0.1–0.9)
MONO%: 8.8 % (ref 0.0–14.0)
NEUT#: 2.2 10*3/uL (ref 1.5–6.5)
NEUT%: 55.6 % (ref 39.0–75.0)
PLATELETS: 163 10*3/uL (ref 140–400)
RBC: 4.62 10*6/uL (ref 4.20–5.82)
RDW: 16.3 % — ABNORMAL HIGH (ref 11.0–14.6)
WBC: 4 10*3/uL (ref 4.0–10.3)
lymph#: 1.3 10*3/uL (ref 0.9–3.3)

## 2016-07-24 LAB — COMPREHENSIVE METABOLIC PANEL
ALT: 34 U/L (ref 0–55)
ANION GAP: 8 meq/L (ref 3–11)
AST: 34 U/L (ref 5–34)
Albumin: 3.9 g/dL (ref 3.5–5.0)
Alkaline Phosphatase: 87 U/L (ref 40–150)
BUN: 13.7 mg/dL (ref 7.0–26.0)
CHLORIDE: 105 meq/L (ref 98–109)
CO2: 25 meq/L (ref 22–29)
Calcium: 9.4 mg/dL (ref 8.4–10.4)
Creatinine: 1 mg/dL (ref 0.7–1.3)
EGFR: 81 mL/min/{1.73_m2} — ABNORMAL LOW (ref >90)
Glucose: 119 mg/dl (ref 70–140)
POTASSIUM: 4.1 meq/L (ref 3.5–5.1)
Sodium: 139 mEq/L (ref 136–145)
Total Bilirubin: 0.63 mg/dL (ref 0.20–1.20)
Total Protein: 7.6 g/dL (ref 6.4–8.3)

## 2016-07-24 LAB — IRON AND TIBC
%SAT: 21 % (ref 20–55)
IRON: 79 ug/dL (ref 42–163)
TIBC: 375 ug/dL (ref 202–409)
UIBC: 296 ug/dL (ref 117–376)

## 2016-07-24 LAB — FERRITIN: FERRITIN: 33 ng/mL (ref 22–316)

## 2016-07-25 LAB — VITAMIN D 25 HYDROXY (VIT D DEFICIENCY, FRACTURES): VIT D 25 HYDROXY: 31.7 ng/mL (ref 30.0–100.0)

## 2016-08-07 DIAGNOSIS — M19012 Primary osteoarthritis, left shoulder: Secondary | ICD-10-CM | POA: Diagnosis not present

## 2016-08-12 DIAGNOSIS — M7502 Adhesive capsulitis of left shoulder: Secondary | ICD-10-CM | POA: Diagnosis not present

## 2016-08-12 DIAGNOSIS — M25512 Pain in left shoulder: Secondary | ICD-10-CM | POA: Diagnosis not present

## 2016-08-12 DIAGNOSIS — M25612 Stiffness of left shoulder, not elsewhere classified: Secondary | ICD-10-CM | POA: Diagnosis not present

## 2016-08-12 NOTE — Addendum Note (Signed)
Addendum  created 08/12/16 1129 by Myrtie Soman, MD   Sign clinical note

## 2016-08-12 NOTE — Anesthesia Postprocedure Evaluation (Signed)
Anesthesia Post Note  Patient: [name]  Procedure(s) Performed: Procedure(s) (LRB): REMOVAL PORT-A-CATH (Left)     Anesthesia Post Evaluation  Last Vitals:  Vitals:   05/02/16 1515 05/02/16 1542  BP: 134/85 136/78  Pulse: (!) 56 64  Resp: 15 16  Temp: 36.7 C 37 C    Last Pain:  Vitals:   05/03/16 1321  TempSrc:   PainSc: 1                  Freddie Nghiem S

## 2016-08-15 DIAGNOSIS — M25612 Stiffness of left shoulder, not elsewhere classified: Secondary | ICD-10-CM | POA: Diagnosis not present

## 2016-08-15 DIAGNOSIS — M25512 Pain in left shoulder: Secondary | ICD-10-CM | POA: Diagnosis not present

## 2016-08-15 DIAGNOSIS — M7502 Adhesive capsulitis of left shoulder: Secondary | ICD-10-CM | POA: Diagnosis not present

## 2016-08-27 DIAGNOSIS — M25612 Stiffness of left shoulder, not elsewhere classified: Secondary | ICD-10-CM | POA: Diagnosis not present

## 2016-08-27 DIAGNOSIS — M25512 Pain in left shoulder: Secondary | ICD-10-CM | POA: Diagnosis not present

## 2016-08-27 DIAGNOSIS — M7502 Adhesive capsulitis of left shoulder: Secondary | ICD-10-CM | POA: Diagnosis not present

## 2016-08-29 DIAGNOSIS — M25512 Pain in left shoulder: Secondary | ICD-10-CM | POA: Diagnosis not present

## 2016-08-29 DIAGNOSIS — M7502 Adhesive capsulitis of left shoulder: Secondary | ICD-10-CM | POA: Diagnosis not present

## 2016-08-29 DIAGNOSIS — M25612 Stiffness of left shoulder, not elsewhere classified: Secondary | ICD-10-CM | POA: Diagnosis not present

## 2016-09-03 DIAGNOSIS — M7502 Adhesive capsulitis of left shoulder: Secondary | ICD-10-CM | POA: Diagnosis not present

## 2016-09-03 DIAGNOSIS — M25512 Pain in left shoulder: Secondary | ICD-10-CM | POA: Diagnosis not present

## 2016-09-03 DIAGNOSIS — M25612 Stiffness of left shoulder, not elsewhere classified: Secondary | ICD-10-CM | POA: Diagnosis not present

## 2016-09-04 DIAGNOSIS — M25512 Pain in left shoulder: Secondary | ICD-10-CM | POA: Diagnosis not present

## 2016-09-09 DIAGNOSIS — I789 Disease of capillaries, unspecified: Secondary | ICD-10-CM | POA: Diagnosis not present

## 2016-09-09 DIAGNOSIS — L239 Allergic contact dermatitis, unspecified cause: Secondary | ICD-10-CM | POA: Diagnosis not present

## 2016-09-09 DIAGNOSIS — L570 Actinic keratosis: Secondary | ICD-10-CM | POA: Diagnosis not present

## 2016-09-09 DIAGNOSIS — D485 Neoplasm of uncertain behavior of skin: Secondary | ICD-10-CM | POA: Diagnosis not present

## 2016-09-10 DIAGNOSIS — M7502 Adhesive capsulitis of left shoulder: Secondary | ICD-10-CM | POA: Diagnosis not present

## 2016-09-10 DIAGNOSIS — M25512 Pain in left shoulder: Secondary | ICD-10-CM | POA: Diagnosis not present

## 2016-09-10 DIAGNOSIS — M25612 Stiffness of left shoulder, not elsewhere classified: Secondary | ICD-10-CM | POA: Diagnosis not present

## 2016-09-12 DIAGNOSIS — M25612 Stiffness of left shoulder, not elsewhere classified: Secondary | ICD-10-CM | POA: Diagnosis not present

## 2016-09-12 DIAGNOSIS — M7502 Adhesive capsulitis of left shoulder: Secondary | ICD-10-CM | POA: Diagnosis not present

## 2016-09-12 DIAGNOSIS — M25512 Pain in left shoulder: Secondary | ICD-10-CM | POA: Diagnosis not present

## 2016-09-24 DIAGNOSIS — M7502 Adhesive capsulitis of left shoulder: Secondary | ICD-10-CM | POA: Diagnosis not present

## 2016-09-24 DIAGNOSIS — M25512 Pain in left shoulder: Secondary | ICD-10-CM | POA: Diagnosis not present

## 2016-09-24 DIAGNOSIS — M25612 Stiffness of left shoulder, not elsewhere classified: Secondary | ICD-10-CM | POA: Diagnosis not present

## 2016-09-26 DIAGNOSIS — M25512 Pain in left shoulder: Secondary | ICD-10-CM | POA: Diagnosis not present

## 2016-09-26 DIAGNOSIS — M7502 Adhesive capsulitis of left shoulder: Secondary | ICD-10-CM | POA: Diagnosis not present

## 2016-09-26 DIAGNOSIS — M25612 Stiffness of left shoulder, not elsewhere classified: Secondary | ICD-10-CM | POA: Diagnosis not present

## 2016-09-30 DIAGNOSIS — M7502 Adhesive capsulitis of left shoulder: Secondary | ICD-10-CM | POA: Diagnosis not present

## 2016-09-30 DIAGNOSIS — M25512 Pain in left shoulder: Secondary | ICD-10-CM | POA: Diagnosis not present

## 2016-09-30 DIAGNOSIS — M25612 Stiffness of left shoulder, not elsewhere classified: Secondary | ICD-10-CM | POA: Diagnosis not present

## 2016-10-02 ENCOUNTER — Other Ambulatory Visit: Payer: Self-pay | Admitting: Gastroenterology

## 2016-10-02 DIAGNOSIS — M25512 Pain in left shoulder: Secondary | ICD-10-CM | POA: Diagnosis not present

## 2016-10-02 DIAGNOSIS — R935 Abnormal findings on diagnostic imaging of other abdominal regions, including retroperitoneum: Secondary | ICD-10-CM

## 2016-10-02 DIAGNOSIS — D509 Iron deficiency anemia, unspecified: Secondary | ICD-10-CM

## 2016-10-02 DIAGNOSIS — K6389 Other specified diseases of intestine: Secondary | ICD-10-CM

## 2016-10-03 ENCOUNTER — Encounter: Payer: Self-pay | Admitting: Hematology and Oncology

## 2016-10-03 DIAGNOSIS — M25512 Pain in left shoulder: Secondary | ICD-10-CM | POA: Diagnosis not present

## 2016-10-03 DIAGNOSIS — M25612 Stiffness of left shoulder, not elsewhere classified: Secondary | ICD-10-CM | POA: Diagnosis not present

## 2016-10-03 DIAGNOSIS — M7502 Adhesive capsulitis of left shoulder: Secondary | ICD-10-CM | POA: Diagnosis not present

## 2016-10-09 ENCOUNTER — Encounter: Payer: Self-pay | Admitting: Hematology and Oncology

## 2016-10-13 ENCOUNTER — Telehealth: Payer: Self-pay

## 2016-10-13 NOTE — Telephone Encounter (Signed)
Called and left a message with a new appt due to pal  Andray Assefa

## 2016-10-14 ENCOUNTER — Other Ambulatory Visit: Payer: BLUE CROSS/BLUE SHIELD

## 2016-10-14 ENCOUNTER — Ambulatory Visit
Admission: RE | Admit: 2016-10-14 | Discharge: 2016-10-14 | Disposition: A | Discharge status: Home / Self Care | Payer: BLUE CROSS/BLUE SHIELD | Source: Ambulatory Visit | Attending: Hematology and Oncology | Admitting: Hematology and Oncology

## 2016-10-14 DIAGNOSIS — C8339 Diffuse large B-cell lymphoma, extranodal and solid organ sites: Secondary | ICD-10-CM

## 2016-10-14 DIAGNOSIS — K7689 Other specified diseases of liver: Secondary | ICD-10-CM | POA: Diagnosis not present

## 2016-10-14 DIAGNOSIS — C8330 Diffuse large B-cell lymphoma, unspecified site: Secondary | ICD-10-CM | POA: Diagnosis not present

## 2016-10-14 MED ORDER — IOPAMIDOL (ISOVUE-300) INJECTION 61%
75.0000 mL | Freq: Once | INTRAVENOUS | Status: AC | PRN
  Administered 2016-10-14: 100 mL via INTRAVENOUS

## 2016-10-16 ENCOUNTER — Ambulatory Visit
Admission: RE | Admit: 2016-10-16 | Discharge: 2016-10-16 | Disposition: A | Discharge status: Home / Self Care | Payer: BLUE CROSS/BLUE SHIELD | Source: Ambulatory Visit | Attending: Gastroenterology | Admitting: Gastroenterology

## 2016-10-16 DIAGNOSIS — K449 Diaphragmatic hernia without obstruction or gangrene: Secondary | ICD-10-CM | POA: Diagnosis not present

## 2016-10-16 DIAGNOSIS — K6389 Other specified diseases of intestine: Secondary | ICD-10-CM

## 2016-10-16 DIAGNOSIS — D509 Iron deficiency anemia, unspecified: Secondary | ICD-10-CM

## 2016-10-16 DIAGNOSIS — D134 Benign neoplasm of liver: Secondary | ICD-10-CM | POA: Diagnosis not present

## 2016-10-16 DIAGNOSIS — R935 Abnormal findings on diagnostic imaging of other abdominal regions, including retroperitoneum: Secondary | ICD-10-CM

## 2016-10-16 MED ORDER — GADOXETATE DISODIUM 0.25 MMOL/ML IV SOLN: 10.0000 mL | Freq: Once | INTRAVENOUS | Status: DC | PRN

## 2016-10-18 ENCOUNTER — Other Ambulatory Visit (HOSPITAL_BASED_OUTPATIENT_CLINIC_OR_DEPARTMENT_OTHER): Payer: BLUE CROSS/BLUE SHIELD

## 2016-10-18 ENCOUNTER — Other Ambulatory Visit: Payer: Self-pay | Admitting: Hematology and Oncology

## 2016-10-18 ENCOUNTER — Ambulatory Visit (HOSPITAL_BASED_OUTPATIENT_CLINIC_OR_DEPARTMENT_OTHER): Payer: BLUE CROSS/BLUE SHIELD | Admitting: Hematology and Oncology

## 2016-10-18 ENCOUNTER — Encounter: Payer: Self-pay | Admitting: Hematology and Oncology

## 2016-10-18 VITALS — BP 103/69 | HR 65 | Temp 98.4°F | Resp 18 | Ht 71.5 in | Wt 201.0 lb

## 2016-10-18 DIAGNOSIS — Z8572 Personal history of non-Hodgkin lymphomas: Secondary | ICD-10-CM

## 2016-10-18 DIAGNOSIS — N63 Unspecified lump in unspecified breast: Secondary | ICD-10-CM

## 2016-10-18 DIAGNOSIS — C8339 Diffuse large B-cell lymphoma, extranodal and solid organ sites: Secondary | ICD-10-CM

## 2016-10-18 DIAGNOSIS — D472 Monoclonal gammopathy: Secondary | ICD-10-CM

## 2016-10-18 LAB — CBC WITH DIFFERENTIAL/PLATELET
BASO%: 0.5 % (ref 0.0–2.0)
BASOS ABS: 0 10*3/uL (ref 0.0–0.1)
EOS ABS: 0.1 10*3/uL (ref 0.0–0.5)
EOS%: 2.9 % (ref 0.0–7.0)
HCT: 37.4 % — ABNORMAL LOW (ref 38.4–49.9)
HGB: 13.1 g/dL (ref 13.0–17.1)
LYMPH%: 28.5 % (ref 14.0–49.0)
MCH: 31.5 pg (ref 27.2–33.4)
MCHC: 35.1 g/dL (ref 32.0–36.0)
MCV: 90 fL (ref 79.3–98.0)
MONO#: 0.4 10*3/uL (ref 0.1–0.9)
MONO%: 9.6 % (ref 0.0–14.0)
NEUT%: 58.5 % (ref 39.0–75.0)
NEUTROS ABS: 2.2 10*3/uL (ref 1.5–6.5)
PLATELETS: 163 10*3/uL (ref 140–400)
RBC: 4.16 10*6/uL — AB (ref 4.20–5.82)
RDW: 13.6 % (ref 11.0–14.6)
WBC: 3.8 10*3/uL — ABNORMAL LOW (ref 4.0–10.3)
lymph#: 1.1 10*3/uL (ref 0.9–3.3)

## 2016-10-18 LAB — COMPREHENSIVE METABOLIC PANEL
ALT: 24 U/L (ref 0–55)
ANION GAP: 7 meq/L (ref 3–11)
AST: 26 U/L (ref 5–34)
Albumin: 3.5 g/dL (ref 3.5–5.0)
Alkaline Phosphatase: 74 U/L (ref 40–150)
BILIRUBIN TOTAL: 0.44 mg/dL (ref 0.20–1.20)
BUN: 19 mg/dL (ref 7.0–26.0)
CO2: 25 meq/L (ref 22–29)
Calcium: 9.3 mg/dL (ref 8.4–10.4)
Chloride: 106 mEq/L (ref 98–109)
Creatinine: 0.9 mg/dL (ref 0.7–1.3)
Glucose: 99 mg/dl (ref 70–140)
POTASSIUM: 4.3 meq/L (ref 3.5–5.1)
SODIUM: 139 meq/L (ref 136–145)
TOTAL PROTEIN: 7.2 g/dL (ref 6.4–8.3)

## 2016-10-18 LAB — LACTATE DEHYDROGENASE: LDH: 145 U/L (ref 125–245)

## 2016-10-18 NOTE — Progress Notes (Signed)
Patient Care Team: Margo Common, MD as PCP - General (Internal Medicine) Clydene Fake, MD as Referring Physician (Hematology and Oncology)  DIAGNOSIS:  Encounter Diagnoses  Name Primary?  . Diffuse large B-cell lymphoma of extranodal site ([place])   . Breast lump   . MGUS (monoclonal gammopathy of unknown significance) Yes    SUMMARY OF ONCOLOGIC HISTORY:   Diffuse large B-cell lymphoma of extranodal site ([facility])   10/17/2015 Initial Diagnosis    Small intestine resection, proximal ileum: Involvement by diffuse large B-cell lymphoma,. Biopsy of peritoneal implant: Invol by a DLBCL.positive for CD20, CD79a,CD10, bcl-6, and bcl-2. CD3 and CD5 highlight scattered admixed T-cells. CD34 and TdT neg      10/31/2015 Pathology Results    Bone marrow biopsy: Hypercellular bone marrow for age 42-60% with trilineage hematopoiesis, plasma cells 9% with Kappa Light chain excess      11/02/2015 PET scan    Nodular thickening within the mesenteries of small bowel 17 mm concerning for peritoneal implant of high-grade lymphoma, activity along the abdominal wound with inflammation suspicion for infection/abscess/fistula of bladder      11/02/2015 Imaging    Ultrasound scrotum: Normal testes, no epididymitis, no varicocele or hydrocele      11/17/2015 - 03/01/2016 Chemotherapy    R CHOP 6 cycles      04/15/2016 PET scan    No residual abnormal hypermetabolic activity to suggest any residual lymphoma. Prior mesenteric activity has resolved. Metabolic activity left central prostate gland. Retracted left testicle         CHIEF COMPLIANT: Follow-up on recent scans for diffuse large B-cell lymphoma  INTERVAL HISTORY: Edward Steele is a 61 year old with above-mentioned history of diffuse large piece lymphoma who underwent chemotherapy with R CHOP. He is here today for routine follow-up. He had recent CT scans and MRI of the liver. The CT scans show no evidence of recurrence of lymphoma. He has been  feeling mildly fatigued due to work related issues but otherwise doing quite well. Denies any fevers chills night sweats or weight loss.  REVIEW OF SYSTEMS:   Constitutional: Denies fevers, chills or abnormal weight loss Eyes: Denies blurriness of vision Ears, nose, mouth, throat, and face: Denies mucositis or sore throat Respiratory: Denies cough, dyspnea or wheezes Cardiovascular: Denies palpitation, chest discomfort Gastrointestinal:  Denies nausea, heartburn or change in bowel habits Skin: Denies abnormal skin rashes Lymphatics: Denies new lymphadenopathy or easy bruising Neurological:Denies numbness, tingling or new weaknesses Behavioral/Psych: Mood is stable, no new changes  Extremities: No lower extremity edema All other systems were reviewed with the patient and are negative.  I have reviewed the past medical history, past surgical history, social history and family history with the patient and they are unchanged from previous note.  ALLERGIES:  is allergic to penicillin g.  MEDICATIONS:  Current Outpatient Prescriptions  Medication Sig Dispense Refill  . acetaminophen (TYLENOL) 325 MG tablet Take 650 mg by mouth every 6 (six) hours as needed for mild pain.    Marland Kitchen albuterol (PROVENTIL HFA;VENTOLIN HFA) 108 (90 Base) MCG/ACT inhaler Inhale 2 puffs into the lungs every 6 (six) hours as needed for wheezing or shortness of breath.     . clonazePAM (KLONOPIN) 1 MG tablet Take 1 mg by mouth 2 (two) times daily.    Marland Kitchen DEXILANT 60 MG capsule Take 60 mg by mouth daily.    Marland Kitchen FLUoxetine (PROZAC) 20 MG tablet Take 40 mg by mouth 2 (two) times daily.    Marland Kitchen gabapentin (NEURONTIN)  300 MG capsule Take 300 mg by mouth at bedtime as needed (nerve pain).     . Multiple Vitamin (MULTIVITAMIN WITH MINERALS) TABS tablet Take 1 tablet by mouth daily.    . rosuvastatin (CRESTOR) 5 MG tablet Take 5 mg by mouth every evening.     . zolpidem (AMBIEN) 10 MG tablet Take 1 tablet (10 mg total) by mouth at  bedtime as needed for sleep. 30 tablet 3   No current facility-administered medications for this visit.     PHYSICAL EXAMINATION: ECOG PERFORMANCE STATUS: 1 - Symptomatic but completely ambulatory  Vitals:   10/18/16 0842  BP: 103/69  Pulse: 65  Resp: 18  Temp: 98.4 F (36.9 C)  SpO2: 99%   Filed Weights   10/18/16 0842  Weight: 201 lb (91.2 kg)    GENERAL:alert, no distress and comfortable SKIN: skin color, texture, turgor are normal, no rashes or significant lesions EYES: normal, Conjunctiva are pink and non-injected, sclera clear OROPHARYNX:no exudate, no erythema and lips, buccal mucosa, and tongue normal  NECK: supple, thyroid normal size, non-tender, without nodularity LYMPH:  no palpable lymphadenopathy in the cervical, axillary or inguinal LUNGS: clear to auscultation and percussion with normal breathing effort HEART: regular rate & rhythm and no murmurs and no lower extremity edema ABDOMEN:abdomen soft, non-tender and normal bowel sounds MUSCULOSKELETAL:no cyanosis of digits and no clubbing  NEURO: alert & oriented x 3 with fluent speech, no focal motor/sensory deficits EXTREMITIES: No lower extremity edema  LABORATORY DATA:  I have reviewed the data as listed   Chemistry      Component Value Date/Time   NA 139 10/18/2016 0822   K 4.3 10/18/2016 0822   CL 105 10/18/2015 0444   CO2 25 10/18/2016 0822   BUN 19.0 10/18/2016 0822   CREATININE 0.9 10/18/2016 0822      Component Value Date/Time   CALCIUM 9.3 10/18/2016 0822   ALKPHOS 74 10/18/2016 0822   AST 26 10/18/2016 0822   ALT 24 10/18/2016 0822   BILITOT 0.44 10/18/2016 0822       Lab Results  Component Value Date   WBC 3.8 (L) 10/18/2016   HGB 13.1 10/18/2016   HCT 37.4 (L) 10/18/2016   MCV 90.0 10/18/2016   PLT 163 10/18/2016   NEUTROABS 2.2 10/18/2016    ASSESSMENT & PLAN:  Diffuse large B-cell lymphoma of extranodal site ([place]) Small intestine resection, proximal ileum08/10/2015:  Involvement by diffuse large B-cell lymphoma,. Biopsy of peritoneal implant: Invol by a DLBCL.positive for CD20, CD79a,CD10, bcl-6, and bcl-2. CD3 and CD5 highlight scattered admixed T-cells. CD34 and TdT neg  PET/CT 11/02/2015: Nodular thickening within the mesenteries of small bowel 17 mm concerning for peritoneal implant of high-grade lymphoma, activity along the abdominal wound with inflammation suspicion for infection/abscess/fistula of bladder Testicular ultrasound: Normal  Bone marrow biopsy08/20/2017: Hypercellular bone marrow for age 57-60% with trilineage hematopoiesis, plasma cells 9% with Kappa Light chain excess, SPEP 1.5 gm M-Protein Monoclonal paraproteinemia: Bone survey showed bone lytic lesions.Elevated M protein MRI Rt Hip: Negative Referred to [facility] for evaluation and for a second opinion reg the M protein and lytic lesions: They agreed that he does not have myeloma ----------------------------------------------------------------------------------------------------------------- DLBCL: Prognosis: Revised IPI score: 1 (4 year disease free survival rate: 80%, estimated overall survival 79%) If I uses age of less than or equal to 60, revised IPI score 45, 74-year-old DFS 94%, estimated overall survival 94%  Treatment summary: R-CHOP chemotherapy 6 cycles started 11/17/2015 completed 03/01/2016 PET/CT scan 04/15/2016:No residual  abnormal hypermetabolic activity to suggest any residual lymphoma. Prior mesenteric activity has resolved. Metabolic activity left central prostate gland. Retracted left testicle ------------------------------------------------------------------------- Ct CAP 10/14/2016: No findings of recurrent lymphoma, distal esophageal thickening, benign liver and kidney lesions stable MRI abdomen 10/16/2016: Stable benign focal nodular hyperplasia of right lower lobe  Return to clinic in 6 months with another CT scan follow-up  I spent 25 minutes talking to the  patient of which more than half was spent in counseling and coordination of care.  Orders Placed This Encounter  Procedures  . CT Chest W Contrast    Standing Status:   Future    Standing Expiration Date:   10/18/2017    Order Specific Question:   If indicated for the ordered procedure, I authorize the administration of contrast media per Radiology protocol    Answer:   Yes    Order Specific Question:   Preferred imaging location?    Answer:   [facility]    Order Specific Question:   Radiology Contrast Protocol - do NOT remove file path    Answer:   [email or web address]    Order Specific Question:   Reason for Exam additional comments    Answer:   Lymphoma restaging  . CT Abdomen Pelvis W Contrast    Standing Status:   Future    Standing Expiration Date:   10/18/2017    Order Specific Question:   If indicated for the ordered procedure, I authorize the administration of contrast media per Radiology protocol    Answer:   Yes    Order Specific Question:   Preferred imaging location?    Answer:   [facility]    Order Specific Question:   Radiology Contrast Protocol - do NOT remove file path    Answer:   [email or web address]    Order Specific Question:   Reason for Exam additional comments    Answer:   Lymphoma restaging  . MM DIAG BREAST TOMO BILATERAL    Bcbs//sent for co sign 10/18/16 EX:[id number]//YFVCB breast lump//no needs/sb w/office    Standing Status:   Future    Standing Expiration Date:   12/18/2017    Order Specific Question:   Reason for Exam (SYMPTOM  OR DIAGNOSIS REQUIRED)    Answer:   Rt breast lump    Order Specific Question:   Preferred imaging location?    Answer:   [facility]   The patient has a good understanding of the overall plan. he agrees with it. he will call with any problems that may develop before the next visit here.   Rulon Eisenmenger, MD 10/18/16

## 2016-10-18 NOTE — Assessment & Plan Note (Signed)
Small intestine resection, proximal ileum08/10/2015: Involvement by diffuse large B-cell lymphoma,. Biopsy of peritoneal implant: Invol by a DLBCL.positive for CD20, CD79a,CD10, bcl-6, and bcl-2. CD3 and CD5 highlight scattered admixed T-cells. CD34 and TdT neg  PET/CT 11/02/2015: Nodular thickening within the mesenteries of small bowel 17 mm concerning for peritoneal implant of high-grade lymphoma, activity along the abdominal wound with inflammation suspicion for infection/abscess/fistula of bladder Testicular ultrasound: Normal  Bone marrow biopsy08/20/2017: Hypercellular bone marrow for age 38-60% with trilineage hematopoiesis, plasma cells 9% with Kappa Light chain excess, SPEP 1.5 gm M-Protein Monoclonal paraproteinemia: Bone survey showed bone lytic lesions.Elevated M protein MRI Rt Hip: Negative Referred to [facility] for evaluation and for a second opinion reg the M protein and lytic lesions: They agreed that he does not have myeloma ----------------------------------------------------------------------------------------------------------------- DLBCL: Prognosis: Revised IPI score: 1 (4 year disease free survival rate: 80%, estimated overall survival 79%) If I uses age of less than or equal to 60, revised IPI score 46, 34-year-old DFS 94%, estimated overall survival 94%  Treatment summary: R-CHOP chemotherapy 6 cycles started 11/17/2015 completed 03/01/2016 PET/CT scan 04/15/2016:No residual abnormal hypermetabolic activity to suggest any residual lymphoma. Prior mesenteric activity has resolved. Metabolic activity left central prostate gland. Retracted left testicle ------------------------------------------------------------------------- Ct CAP 10/14/2016: No findings of recurrent lymphoma, distal esophageal thickening, benign liver and kidney lesions stable MRI abdomen 10/16/2016: Stable benign focal nodular hyperplasia of right lower lobe  Return to clinic in 6 months with another CT  scan follow-up

## 2016-10-21 LAB — KAPPA/LAMBDA LIGHT CHAINS
Ig Kappa Free Light Chain: 12 mg/L (ref 3.3–19.4)
Ig Lambda Free Light Chain: 10.4 mg/L (ref 5.7–26.3)
Kappa/Lambda FluidC Ratio: 1.15 (ref 0.26–1.65)

## 2016-10-22 ENCOUNTER — Telehealth: Payer: Self-pay

## 2016-10-22 ENCOUNTER — Ambulatory Visit: Payer: BLUE CROSS/BLUE SHIELD | Admitting: Hematology and Oncology

## 2016-10-22 ENCOUNTER — Other Ambulatory Visit: Payer: BLUE CROSS/BLUE SHIELD

## 2016-10-22 LAB — MULTIPLE MYELOMA PANEL, SERUM
Albumin SerPl Elph-Mcnc: 3.6 g/dL (ref 2.9–4.4)
Albumin/Glob SerPl: 1.1 (ref 0.7–1.7)
Alpha 1: 0.2 g/dL (ref 0.0–0.4)
Alpha2 Glob SerPl Elph-Mcnc: 0.6 g/dL (ref 0.4–1.0)
B-GLOBULIN SERPL ELPH-MCNC: 0.9 g/dL (ref 0.7–1.3)
GAMMA GLOB SERPL ELPH-MCNC: 1.7 g/dL (ref 0.4–1.8)
GLOBULIN, TOTAL: 3.4 g/dL (ref 2.2–3.9)
IgA, Qn, Serum: 110 mg/dL (ref 61–437)
IgG, Qn, Serum: 1480 mg/dL (ref 700–1600)
IgM, Qn, Serum: 67 mg/dL (ref 20–172)
M PROTEIN SERPL ELPH-MCNC: 1.3 g/dL — AB
Total Protein: 7 g/dL (ref 6.0–8.5)

## 2016-10-22 NOTE — Telephone Encounter (Signed)
Pt called for his lab results, results given and told the M protein is present. He asked that Dr Lindi Adie call him [place] gets [place] country to explain the meaning of the lab. A copy was mailed to the pt.

## 2016-10-23 ENCOUNTER — Other Ambulatory Visit: Payer: BLUE CROSS/BLUE SHIELD

## 2016-10-23 DIAGNOSIS — D0411 Carcinoma in situ of skin of right eyelid, including canthus: Secondary | ICD-10-CM | POA: Diagnosis not present

## 2016-10-24 ENCOUNTER — Ambulatory Visit: Payer: BLUE CROSS/BLUE SHIELD

## 2016-10-24 ENCOUNTER — Ambulatory Visit
Admission: RE | Admit: 2016-10-24 | Discharge: 2016-10-24 | Disposition: A | Discharge status: Home / Self Care | Payer: BLUE CROSS/BLUE SHIELD | Source: Ambulatory Visit | Attending: Hematology and Oncology | Admitting: Hematology and Oncology

## 2016-10-24 DIAGNOSIS — N63 Unspecified lump in unspecified breast: Secondary | ICD-10-CM

## 2016-10-24 DIAGNOSIS — R928 Other abnormal and inconclusive findings on diagnostic imaging of breast: Secondary | ICD-10-CM | POA: Diagnosis not present

## 2016-10-24 HISTORY — DX: Personal history of antineoplastic chemotherapy: Z92.21

## 2016-12-02 DIAGNOSIS — H9209 Otalgia, unspecified ear: Secondary | ICD-10-CM | POA: Diagnosis not present

## 2016-12-02 DIAGNOSIS — Z23 Encounter for immunization: Secondary | ICD-10-CM | POA: Diagnosis not present

## 2017-04-07 ENCOUNTER — Telehealth: Payer: Self-pay

## 2017-04-07 NOTE — Telephone Encounter (Signed)
Informed patient of his lab appt prior to his Ct scan. Confirmed all upcoming appts with pt. He has clear understanding. No further questions at this time.  Cyndia Bent RN

## 2017-04-17 ENCOUNTER — Telehealth: Payer: Self-pay

## 2017-04-17 ENCOUNTER — Other Ambulatory Visit: Payer: Self-pay

## 2017-04-17 DIAGNOSIS — C8339 Diffuse large B-cell lymphoma, extranodal and solid organ sites: Secondary | ICD-10-CM

## 2017-04-17 NOTE — Telephone Encounter (Signed)
I called [employer] to initiate this case and unfortunately, the facility that patient is scheduled at [facility]) is out-of-network. I also tried other local facilities including [place] and [place] and these are OON as well. Patient has no OON benefits per [employer] and I am unable to even initiated a case. I have spoke to patient about this and he is aware that appt is canceled. Darlena will follow up with [facility] and patient tomorrow.

## 2017-04-18 ENCOUNTER — Telehealth: Payer: Self-pay

## 2017-04-18 ENCOUNTER — Inpatient Hospital Stay (HOSPITAL_COMMUNITY): Admission: RE | Admit: 2017-04-18 | Payer: BLUE CROSS/BLUE SHIELD | Source: Ambulatory Visit

## 2017-04-18 ENCOUNTER — Other Ambulatory Visit: Payer: BLUE CROSS/BLUE SHIELD

## 2017-04-18 ENCOUNTER — Ambulatory Visit (HOSPITAL_COMMUNITY): Payer: BLUE CROSS/BLUE SHIELD

## 2017-04-18 ENCOUNTER — Ambulatory Visit (HOSPITAL_COMMUNITY)
Admission: RE | Admit: 2017-04-18 | Discharge: 2017-04-18 | Disposition: A | Discharge status: Home / Self Care | Payer: BLUE CROSS/BLUE SHIELD | Source: Ambulatory Visit | Attending: Hematology and Oncology | Admitting: Hematology and Oncology

## 2017-04-18 ENCOUNTER — Inpatient Hospital Stay: Payer: BLUE CROSS/BLUE SHIELD | Attending: Hematology and Oncology

## 2017-04-18 ENCOUNTER — Ambulatory Visit: Payer: BLUE CROSS/BLUE SHIELD

## 2017-04-18 ENCOUNTER — Other Ambulatory Visit: Payer: Self-pay

## 2017-04-18 DIAGNOSIS — Z8572 Personal history of non-Hodgkin lymphomas: Secondary | ICD-10-CM | POA: Insufficient documentation

## 2017-04-18 DIAGNOSIS — D472 Monoclonal gammopathy: Secondary | ICD-10-CM | POA: Diagnosis not present

## 2017-04-18 DIAGNOSIS — R5383 Other fatigue: Secondary | ICD-10-CM | POA: Diagnosis not present

## 2017-04-18 DIAGNOSIS — R935 Abnormal findings on diagnostic imaging of other abdominal regions, including retroperitoneum: Secondary | ICD-10-CM | POA: Insufficient documentation

## 2017-04-18 DIAGNOSIS — Z981 Arthrodesis status: Secondary | ICD-10-CM | POA: Insufficient documentation

## 2017-04-18 DIAGNOSIS — C8339 Diffuse large B-cell lymphoma, extranodal and solid organ sites: Secondary | ICD-10-CM

## 2017-04-18 DIAGNOSIS — K6389 Other specified diseases of intestine: Secondary | ICD-10-CM | POA: Diagnosis not present

## 2017-04-18 DIAGNOSIS — C8512 Unspecified B-cell lymphoma, intrathoracic lymph nodes: Secondary | ICD-10-CM | POA: Diagnosis not present

## 2017-04-18 LAB — CBC WITH DIFFERENTIAL (CANCER CENTER ONLY)
Basophils Absolute: 0 10*3/uL (ref 0.0–0.1)
Basophils Relative: 0 %
Eosinophils Absolute: 0.1 10*3/uL (ref 0.0–0.5)
Eosinophils Relative: 2 %
HEMATOCRIT: 39 % (ref 38.4–49.9)
HEMOGLOBIN: 13.2 g/dL (ref 13.0–17.1)
Lymphocytes Relative: 31 %
Lymphs Abs: 1.4 10*3/uL (ref 0.9–3.3)
MCH: 30.1 pg (ref 27.2–33.4)
MCHC: 33.8 g/dL (ref 32.0–36.0)
MCV: 88.8 fL (ref 79.3–98.0)
MONOS PCT: 9 %
Monocytes Absolute: 0.4 10*3/uL (ref 0.1–0.9)
NEUTROS ABS: 2.6 10*3/uL (ref 1.5–6.5)
NEUTROS PCT: 58 %
Platelet Count: 139 10*3/uL — ABNORMAL LOW (ref 140–400)
RBC: 4.39 MIL/uL (ref 4.20–5.82)
RDW: 13.3 % (ref 11.0–14.6)
WBC: 4.5 10*3/uL (ref 4.0–10.3)

## 2017-04-18 LAB — CMP (CANCER CENTER ONLY)
ALT: 24 U/L (ref 0–55)
ANION GAP: 9 (ref 3–11)
AST: 28 U/L (ref 5–34)
Albumin: 3.8 g/dL (ref 3.5–5.0)
Alkaline Phosphatase: 71 U/L (ref 40–150)
BUN: 20 mg/dL (ref 7–26)
CO2: 27 mmol/L (ref 22–29)
Calcium: 8.9 mg/dL (ref 8.4–10.4)
Chloride: 106 mmol/L (ref 98–109)
Creatinine: 1 mg/dL (ref 0.70–1.30)
GFR, Estimated: >60 mL/min (ref >60)
Glucose, Bld: 86 mg/dL (ref 70–140)
POTASSIUM: 3.9 mmol/L (ref 3.5–5.1)
SODIUM: 142 mmol/L (ref 136–145)
Total Bilirubin: 0.7 mg/dL (ref 0.2–1.2)
Total Protein: 7.5 g/dL (ref 6.4–8.3)

## 2017-04-18 MED ORDER — IOPAMIDOL (ISOVUE-300) INJECTION 61%
INTRAVENOUS | Status: AC
  Filled 2017-04-18: qty 100

## 2017-04-18 MED ORDER — SODIUM CHLORIDE 0.9 % IJ SOLN
INTRAMUSCULAR | Status: AC
  Filled 2017-04-18: qty 50

## 2017-04-18 MED ORDER — IOPAMIDOL (ISOVUE-300) INJECTION 61%
100.0000 mL | Freq: Once | INTRAVENOUS | Status: AC | PRN
  Administered 2017-04-18: 100 mL via INTRAVENOUS

## 2017-04-18 NOTE — Telephone Encounter (Addendum)
Spoke with pt to confirm of his appointment for CT. Pt states that he spoke with Darlena as well and confirmed that his prior auth was approved and that he will be able to get his CT at 6pm today. Pt aware of lab appt at 4pm and check in at 530pm to radiology at [facility] long.  Pt aware of MD appt with Dr.[name] on Monday to discuss CT results.

## 2017-04-21 ENCOUNTER — Inpatient Hospital Stay: Payer: BLUE CROSS/BLUE SHIELD

## 2017-04-21 ENCOUNTER — Ambulatory Visit (HOSPITAL_COMMUNITY): Payer: BLUE CROSS/BLUE SHIELD

## 2017-04-21 ENCOUNTER — Other Ambulatory Visit: Payer: BLUE CROSS/BLUE SHIELD

## 2017-04-21 ENCOUNTER — Ambulatory Visit: Payer: BLUE CROSS/BLUE SHIELD | Admitting: Hematology and Oncology

## 2017-04-21 ENCOUNTER — Inpatient Hospital Stay (HOSPITAL_BASED_OUTPATIENT_CLINIC_OR_DEPARTMENT_OTHER): Payer: BLUE CROSS/BLUE SHIELD | Admitting: Hematology and Oncology

## 2017-04-21 VITALS — BP 138/81 | HR 61 | Temp 97.6°F | Resp 20 | Ht 71.5 in | Wt 202.0 lb

## 2017-04-21 DIAGNOSIS — C8339 Diffuse large B-cell lymphoma, extranodal and solid organ sites: Secondary | ICD-10-CM

## 2017-04-21 DIAGNOSIS — D472 Monoclonal gammopathy: Secondary | ICD-10-CM

## 2017-04-21 DIAGNOSIS — R5383 Other fatigue: Secondary | ICD-10-CM

## 2017-04-21 DIAGNOSIS — Z8572 Personal history of non-Hodgkin lymphomas: Secondary | ICD-10-CM | POA: Diagnosis not present

## 2017-04-21 DIAGNOSIS — R935 Abnormal findings on diagnostic imaging of other abdominal regions, including retroperitoneum: Secondary | ICD-10-CM | POA: Diagnosis not present

## 2017-04-21 LAB — CMP (CANCER CENTER ONLY)
ALBUMIN: 4 g/dL (ref 3.5–5.0)
ALK PHOS: 73 U/L (ref 40–150)
ALT: 23 U/L (ref 0–55)
AST: 29 U/L (ref 5–34)
Anion gap: 9 (ref 3–11)
BILIRUBIN TOTAL: 0.7 mg/dL (ref 0.2–1.2)
BUN: 19 mg/dL (ref 7–26)
CALCIUM: 9.3 mg/dL (ref 8.4–10.4)
CO2: 28 mmol/L (ref 22–29)
Chloride: 102 mmol/L (ref 98–109)
Creatinine: 1.08 mg/dL (ref 0.70–1.30)
GFR, Est AFR Am: >60 mL/min (ref >60)
GFR, Estimated: >60 mL/min (ref >60)
GLUCOSE: 79 mg/dL (ref 70–140)
Potassium: 4.1 mmol/L (ref 3.5–5.1)
SODIUM: 139 mmol/L (ref 136–145)
TOTAL PROTEIN: 7.8 g/dL (ref 6.4–8.3)

## 2017-04-21 LAB — LACTATE DEHYDROGENASE: LDH: 164 U/L (ref 125–245)

## 2017-04-21 MED ORDER — MELOXICAM 7.5 MG PO TABS: 7.5000 mg | ORAL_TABLET | Freq: Every day | ORAL | Status: DC

## 2017-04-21 MED ORDER — GABAPENTIN 300 MG PO CAPS: 300.0000 mg | ORAL_CAPSULE | Freq: Every evening | ORAL | 0 refills | Status: DC | PRN

## 2017-04-21 NOTE — Progress Notes (Signed)
Patient Care Team: Margo Common, MD as PCP - General (Internal Medicine) Clydene Fake, MD as Referring Physician (Hematology and Oncology)  DIAGNOSIS:  Encounter Diagnoses  Name Primary?  Marland Kitchen MGUS (monoclonal gammopathy of unknown significance) Yes  . Diffuse large B-cell lymphoma of extranodal site ([place])     SUMMARY OF ONCOLOGIC HISTORY:   Diffuse large B-cell lymphoma of extranodal site ([place])   10/17/2015 Initial Diagnosis    Small intestine resection, proximal ileum: Involvement by diffuse large B-cell lymphoma,. Biopsy of peritoneal implant: Invol by a DLBCL.positive for CD20, CD79a,CD10, bcl-6, and bcl-2. CD3 and CD5 highlight scattered admixed T-cells. CD34 and TdT neg      10/31/2015 Pathology Results    Bone marrow biopsy: Hypercellular bone marrow for age 34-60% with trilineage hematopoiesis, plasma cells 9% with Kappa Light chain excess      11/02/2015 PET scan    Nodular thickening within the mesenteries of small bowel 17 mm concerning for peritoneal implant of high-grade lymphoma, activity along the abdominal wound with inflammation suspicion for infection/abscess/fistula of bladder      11/02/2015 Imaging    Ultrasound scrotum: Normal testes, no epididymitis, no varicocele or hydrocele      11/17/2015 - 03/01/2016 Chemotherapy    R CHOP 6 cycles      04/15/2016 PET scan    No residual abnormal hypermetabolic activity to suggest any residual lymphoma. Prior mesenteric activity has resolved. Metabolic activity left central prostate gland. Retracted left testicle         CHIEF COMPLIANT: Follow-up to discuss recently performed CT scan  INTERVAL HISTORY: Edward Steele is a 62 year old with above-mentioned history of small bowel diffuse large B-cell lymphoma with peritoneal implant underwent initial surgery followed by R CHOP x6 cycles and had a complete response based on PET/CT scan.  He had a recent CT abdomen and pelvis which showed asymmetric thickening at the  site of the anastomosis.  This was raising the concern for recurrent lymphoma.  Interestingly over the past 2 weeks patient has been experiencing increased fatigue and generalized weakness.  He denies any fevers or chills or night sweats or weight loss.  Just overall not feeling well.  He denies any cold or cough symptoms.  REVIEW OF SYSTEMS:   Constitutional: Denies fevers, chills or abnormal weight loss Eyes: Denies blurriness of vision Ears, nose, mouth, throat, and face: Denies mucositis or sore throat Respiratory: Denies cough, dyspnea or wheezes Cardiovascular: Denies palpitation, chest discomfort Gastrointestinal:  Denies nausea, heartburn or change in bowel habits Skin: Denies abnormal skin rashes Lymphatics: Denies new lymphadenopathy or easy bruising Neurological:Denies numbness, tingling or new weaknesses Behavioral/Psych: Mood is stable, no new changes  Extremities: No lower extremity edema  All other systems were reviewed with the patient and are negative.  I have reviewed the past medical history, past surgical history, social history and family history with the patient and they are unchanged from previous note.  ALLERGIES:  is allergic to penicillin g.  MEDICATIONS:  Current Outpatient Medications  Medication Sig Dispense Refill  . acetaminophen (TYLENOL) 325 MG tablet Take 650 mg by mouth every 6 (six) hours as needed for mild pain.    Marland Kitchen albuterol (PROVENTIL HFA;VENTOLIN HFA) 108 (90 Base) MCG/ACT inhaler Inhale 2 puffs into the lungs every 6 (six) hours as needed for wheezing or shortness of breath.     . clonazePAM (KLONOPIN) 1 MG tablet Take 1 mg by mouth 2 (two) times daily.    Marland Kitchen DEXILANT 60 MG capsule  Take 60 mg by mouth daily.    Marland Kitchen FLUoxetine (PROZAC) 20 MG tablet Take 40 mg by mouth 2 (two) times daily.    Marland Kitchen gabapentin (NEURONTIN) 300 MG capsule Take 1 capsule (300 mg total) by mouth at bedtime as needed (nerve pain). 30 capsule 0  . meloxicam (MOBIC) 7.5 MG  tablet Take 1 tablet (7.5 mg total) by mouth daily.    . Multiple Vitamin (MULTIVITAMIN WITH MINERALS) TABS tablet Take 1 tablet by mouth daily.    . rosuvastatin (CRESTOR) 5 MG tablet Take 5 mg by mouth every evening.     . zolpidem (AMBIEN) 10 MG tablet Take 1 tablet (10 mg total) by mouth at bedtime as needed for sleep. 30 tablet 3   No current facility-administered medications for this visit.     PHYSICAL EXAMINATION: ECOG PERFORMANCE STATUS: 1 - Symptomatic but completely ambulatory  Vitals:   04/21/17 1425  BP: 138/81  Pulse: 61  Resp: 20  Temp: 97.6 F (36.4 C)  SpO2: 100%   Filed Weights   04/21/17 1425  Weight: 202 lb (91.6 kg)    GENERAL:alert, no distress and comfortable SKIN: skin color, texture, turgor are normal, no rashes or significant lesions EYES: normal, Conjunctiva are pink and non-injected, sclera clear OROPHARYNX:no exudate, no erythema and lips, buccal mucosa, and tongue normal  NECK: supple, thyroid normal size, non-tender, without nodularity LYMPH:  no palpable lymphadenopathy in the cervical, axillary or inguinal LUNGS: clear to auscultation and percussion with normal breathing effort HEART: regular rate & rhythm and no murmurs and no lower extremity edema ABDOMEN:abdomen soft, non-tender and normal bowel sounds MUSCULOSKELETAL:no cyanosis of digits and no clubbing  NEURO: alert & oriented x 3 with fluent speech, no focal motor/sensory deficits EXTREMITIES: No lower extremity edema  LABORATORY DATA:  I have reviewed the data as listed CMP Latest Ref Rng & Units 04/21/2017 04/18/2017 10/18/2016  Glucose 70 - 140 mg/dL 79 86 99  BUN 7 - 26 mg/dL 19 20 19.0  Creatinine 0.70 - 1.30 mg/dL 1.08 1.00 0.9  Sodium 136 - 145 mmol/L 139 142 139  Potassium 3.5 - 5.1 mmol/L 4.1 3.9 4.3  Chloride 98 - 109 mmol/L 102 106 -  CO2 22 - 29 mmol/L _0 Calcium 8.4 - 10.4 mg/dL 9.3 8.9 9.3  Total Protein 6.4 - 8.3 g/dL 7.8 7.5 7.2  Total Bilirubin 0.2 - 1.2  mg/dL 0.7 0.7 0.44  Alkaline Phos 40 - 150 U/L 73 71 74  AST 5 - 34 U/L _1 ALT 0 - 55 U/L _2 Lab Results  Component Value Date   WBC 4.5 04/18/2017   HGB 13.1 10/18/2016   HCT 39.0 04/18/2017   MCV 88.8 04/18/2017   PLT 139 (L) 04/18/2017   NEUTROABS 2.6 04/18/2017    ASSESSMENT & PLAN:  Diffuse large B-cell lymphoma of extranodal site ([facility]) Small intestine resection, proximal ileum08/10/2015: Involvement by diffuse large B-cell lymphoma,. Biopsy of peritoneal implant: Invol by a DLBCL.positive for CD20, CD79a,CD10, bcl-6, and bcl-2. CD3 and CD5 highlight scattered admixed T-cells. CD34 and TdT neg  PET/CT 11/02/2015: Nodular thickening within the mesenteries of small bowel 17 mm concerning for peritoneal implant of high-grade lymphoma, activity along the abdominal wound with inflammation suspicion for infection/abscess/fistula of bladder Testicular ultrasound: Normal  Bone marrow biopsy08/20/2017: Hypercellular bone marrow for age 86-60% with trilineage hematopoiesis, plasma cells 9% with Kappa Light chain excess, SPEP 1.5 gm M-Protein Monoclonal paraproteinemia: Bone  survey showed bone lytic lesions.Elevated M protein MRI Rt Hip: Negative Referredto [facility] for evaluation and for a second opinion reg the M protein and lytic lesions: They agreed that he does not have myeloma ----------------------------------------------------------------------------------------------------------------- DLBCL: Prognosis: Revised IPI score: 1 (4 year disease free survival rate: 80%, estimated overall survival 79%) If I uses age of less than or equal to 60, revised IPI score 93, 46-year-old DFS 94%, estimated overall survival 94%  Treatment summary: R-CHOP chemotherapy 6 cycles started 09/08/2017completed 03/01/2016 PET/CT scan 04/15/2016:No residual abnormal hypermetabolic activity to suggest any residual lymphoma. Prior mesentericactivity has resolved. Metabolic activity left  central prostate gland. Retracted left testicle ------------------------------------------------------------------------- Ct CAP 04/19/2017: Mild asymmetric wall thickening in the region of the small bowel anastomosis.  Indeterminate finding needs a PET/CT no other abdominal or pelvic lymphadenopathy  Patient feels fatigued, and CT scan showing abnormality at the small bowel anastomosis.  We will obtain a PET/CT scan for further evaluation.  He will need LDH as well as Serum protein electrophoresis for evaluation of MGUS.   Return in 1 week after the PET/CT scan to review the results.  I spent 25 minutes talking to the patient of which more than half was spent in counseling and coordination of care.  Orders Placed This Encounter  Procedures  . NM PET Image Restag (PS) Skull Base To Thigh    Standing Status:   Future    Standing Expiration Date:   04/21/2018    Order Specific Question:   If indicated for the ordered procedure, I authorize the administration of a radiopharmaceutical per Radiology protocol    Answer:   Yes    Order Specific Question:   Preferred imaging location?    Answer:   [facility]    Order Specific Question:   Radiology Contrast Protocol - do NOT remove file path    Answer:   [email or web address]    Order Specific Question:   Reason for Exam additional comments    Answer:   Lymphoma restaging. Patient has thickening at the site of small bowel resection.  . Lactate dehydrogenase (LDH)    Standing Status:   Future    Number of Occurrences:   1    Standing Expiration Date:   04/21/2018  . Multiple Myeloma Panel (SPEP&IFE w/QIG)    Standing Status:   Future    Number of Occurrences:   1    Standing Expiration Date:   05/26/2018  . Kappa/lambda light chains    Standing Status:   Future    Number of Occurrences:   1    Standing Expiration Date:   05/26/2018   The patient has a good understanding of the overall plan. he agrees with it.  he will call with any problems that may develop before the next visit here.   Harriette Ohara, MD 04/21/17

## 2017-04-21 NOTE — Assessment & Plan Note (Signed)
Small intestine resection, proximal ileum08/10/2015: Involvement by diffuse large B-cell lymphoma,. Biopsy of peritoneal implant: Invol by a DLBCL.positive for CD20, CD79a,CD10, bcl-6, and bcl-2. CD3 and CD5 highlight scattered admixed T-cells. CD34 and TdT neg  PET/CT 11/02/2015: Nodular thickening within the mesenteries of small bowel 17 mm concerning for peritoneal implant of high-grade lymphoma, activity along the abdominal wound with inflammation suspicion for infection/abscess/fistula of bladder Testicular ultrasound: Normal  Bone marrow biopsy08/20/2017: Hypercellular bone marrow for age 110-60% with trilineage hematopoiesis, plasma cells 9% with Kappa Light chain excess, SPEP 1.5 gm M-Protein Monoclonal paraproteinemia: Bone survey showed bone lytic lesions.Elevated M protein MRI Rt Hip: Negative Referredto [facility] for evaluation and for a second opinion reg the M protein and lytic lesions: They agreed that he does not have myeloma ----------------------------------------------------------------------------------------------------------------- DLBCL: Prognosis: Revised IPI score: 1 (4 year disease free survival rate: 80%, estimated overall survival 79%) If I uses age of less than or equal to 60, revised IPI score 32, 62-year-old DFS 94%, estimated overall survival 94%  Treatment summary: R-CHOP chemotherapy 6 cycles started 09/08/2017completed 03/01/2016 PET/CT scan 04/15/2016:No residual abnormal hypermetabolic activity to suggest any residual lymphoma. Prior mesentericactivity has resolved. Metabolic activity left central prostate gland. Retracted left testicle ------------------------------------------------------------------------- Ct CAP 04/19/2017: Mild asymmetric wall thickening in the region of the small bowel anastomosis.  Indeterminate finding needs a PET/CT no other abdominal or pelvic lymphadenopathy  Patient feels fatigued, and CT scan showing abnormality at the small bowel  anastomosis.  We will obtain a PET/CT scan for further evaluation.  He will need LDH as well as Serum protein electrophoresis for evaluation of MGUS.

## 2017-04-22 ENCOUNTER — Telehealth: Payer: Self-pay

## 2017-04-22 LAB — KAPPA/LAMBDA LIGHT CHAINS
KAPPA FREE LGHT CHN: 13.5 mg/L (ref 3.3–19.4)
Kappa, lambda light chain ratio: 1.29 (ref 0.26–1.65)
Lambda free light chains: 10.5 mg/L (ref 5.7–26.3)

## 2017-04-22 NOTE — Telephone Encounter (Signed)
Returned pt call and informed him his orders for PET scan were placed yesterday and a message was sent for prior auth and he should hear from them to get scheduled this week.  Cyndia Bent RN

## 2017-04-23 ENCOUNTER — Telehealth: Payer: Self-pay | Admitting: Hematology and Oncology

## 2017-04-23 NOTE — Telephone Encounter (Signed)
Spoke to patients wife regarding upcoming February appointments.  °

## 2017-04-24 ENCOUNTER — Encounter: Payer: Self-pay | Admitting: Hematology and Oncology

## 2017-04-24 ENCOUNTER — Telehealth: Payer: Self-pay

## 2017-04-24 NOTE — Telephone Encounter (Signed)
Spoke with patient and change appointment based on patient stated they will be out of town. Per 2/14 phone messages

## 2017-04-28 LAB — MULTIPLE MYELOMA PANEL, SERUM
Albumin SerPl Elph-Mcnc: 3.8 g/dL (ref 2.9–4.4)
Albumin/Glob SerPl: 1.1 (ref 0.7–1.7)
Alpha 1: 0.2 g/dL (ref 0.0–0.4)
Alpha2 Glob SerPl Elph-Mcnc: 0.6 g/dL (ref 0.4–1.0)
B-GLOBULIN SERPL ELPH-MCNC: 1 g/dL (ref 0.7–1.3)
GAMMA GLOB SERPL ELPH-MCNC: 1.8 g/dL (ref 0.4–1.8)
GLOBULIN, TOTAL: 3.7 g/dL (ref 2.2–3.9)
IGA: 97 mg/dL (ref 61–437)
IgG (Immunoglobin G), Serum: 1852 mg/dL — ABNORMAL HIGH (ref 700–1600)
IgM (Immunoglobulin M), Srm: 66 mg/dL (ref 20–172)
M PROTEIN SERPL ELPH-MCNC: 1.4 g/dL — AB
Total Protein ELP: 7.5 g/dL (ref 6.0–8.5)

## 2017-05-01 ENCOUNTER — Ambulatory Visit (HOSPITAL_COMMUNITY)
Admission: RE | Admit: 2017-05-01 | Discharge: 2017-05-01 | Disposition: A | Discharge status: Home / Self Care | Payer: BLUE CROSS/BLUE SHIELD | Source: Ambulatory Visit | Attending: Hematology and Oncology | Admitting: Hematology and Oncology

## 2017-05-01 ENCOUNTER — Telehealth: Payer: Self-pay | Admitting: Hematology and Oncology

## 2017-05-01 DIAGNOSIS — C8339 Diffuse large B-cell lymphoma, extranodal and solid organ sites: Secondary | ICD-10-CM | POA: Diagnosis not present

## 2017-05-01 DIAGNOSIS — C719 Malignant neoplasm of brain, unspecified: Secondary | ICD-10-CM | POA: Diagnosis not present

## 2017-05-01 LAB — GLUCOSE, CAPILLARY: Glucose-Capillary: 103 mg/dL — ABNORMAL HIGH (ref 65–99)

## 2017-05-01 MED ORDER — FLUDEOXYGLUCOSE F - 18 (FDG) INJECTION
9.6000 | Freq: Once | INTRAVENOUS | Status: AC | PRN
  Administered 2017-05-01: 9.6 via INTRAVENOUS

## 2017-05-01 NOTE — Telephone Encounter (Signed)
I called the patient and inform him that the PET scan does not show any evidence of lymphoma. I suspect the cause of his fatigue may be related to underlying viral syndrome. He does have MGUS and does not have any evidence of myeloma.

## 2017-05-02 ENCOUNTER — Telehealth: Payer: Self-pay | Admitting: Hematology and Oncology

## 2017-05-02 NOTE — Telephone Encounter (Signed)
Mailed patient calendar of upcoming August appointments per 2/21 sch message

## 2017-05-06 ENCOUNTER — Other Ambulatory Visit: Payer: BLUE CROSS/BLUE SHIELD

## 2017-05-06 ENCOUNTER — Ambulatory Visit: Payer: BLUE CROSS/BLUE SHIELD | Admitting: Hematology and Oncology

## 2017-05-08 ENCOUNTER — Ambulatory Visit: Payer: BLUE CROSS/BLUE SHIELD | Admitting: Hematology and Oncology

## 2017-05-08 ENCOUNTER — Other Ambulatory Visit: Payer: BLUE CROSS/BLUE SHIELD

## 2017-05-08 DIAGNOSIS — L853 Xerosis cutis: Secondary | ICD-10-CM | POA: Diagnosis not present

## 2017-05-08 DIAGNOSIS — L570 Actinic keratosis: Secondary | ICD-10-CM | POA: Diagnosis not present

## 2017-05-08 DIAGNOSIS — H52213 Irregular astigmatism, bilateral: Secondary | ICD-10-CM | POA: Diagnosis not present

## 2017-05-08 DIAGNOSIS — D225 Melanocytic nevi of trunk: Secondary | ICD-10-CM | POA: Diagnosis not present

## 2017-05-08 DIAGNOSIS — D2261 Melanocytic nevi of right upper limb, including shoulder: Secondary | ICD-10-CM | POA: Diagnosis not present

## 2017-05-08 DIAGNOSIS — L821 Other seborrheic keratosis: Secondary | ICD-10-CM | POA: Diagnosis not present

## 2017-05-20 ENCOUNTER — Telehealth: Payer: Self-pay | Admitting: Hematology and Oncology

## 2017-05-20 NOTE — Telephone Encounter (Signed)
Per pt phone conversation to fax his labs to his pcp

## 2017-05-21 DIAGNOSIS — H9201 Otalgia, right ear: Secondary | ICD-10-CM | POA: Diagnosis not present

## 2017-05-21 DIAGNOSIS — H9202 Otalgia, left ear: Secondary | ICD-10-CM | POA: Diagnosis not present

## 2017-05-30 DIAGNOSIS — F419 Anxiety disorder, unspecified: Secondary | ICD-10-CM | POA: Diagnosis not present

## 2017-05-30 DIAGNOSIS — L404 Guttate psoriasis: Secondary | ICD-10-CM | POA: Diagnosis not present

## 2017-05-30 DIAGNOSIS — E7800 Pure hypercholesterolemia, unspecified: Secondary | ICD-10-CM | POA: Diagnosis not present

## 2017-05-30 DIAGNOSIS — Z23 Encounter for immunization: Secondary | ICD-10-CM | POA: Diagnosis not present

## 2017-05-30 DIAGNOSIS — K644 Residual hemorrhoidal skin tags: Secondary | ICD-10-CM | POA: Diagnosis not present

## 2017-05-30 DIAGNOSIS — Z79899 Other long term (current) drug therapy: Secondary | ICD-10-CM | POA: Diagnosis not present

## 2017-08-05 DIAGNOSIS — M5412 Radiculopathy, cervical region: Secondary | ICD-10-CM | POA: Diagnosis not present

## 2017-08-05 DIAGNOSIS — M542 Cervicalgia: Secondary | ICD-10-CM | POA: Diagnosis not present

## 2017-08-12 DIAGNOSIS — M542 Cervicalgia: Secondary | ICD-10-CM | POA: Diagnosis not present

## 2017-08-14 DIAGNOSIS — M542 Cervicalgia: Secondary | ICD-10-CM | POA: Diagnosis not present

## 2017-08-14 DIAGNOSIS — M5412 Radiculopathy, cervical region: Secondary | ICD-10-CM | POA: Diagnosis not present

## 2017-11-03 ENCOUNTER — Other Ambulatory Visit: Payer: Self-pay

## 2017-11-03 DIAGNOSIS — D472 Monoclonal gammopathy: Secondary | ICD-10-CM

## 2017-11-04 ENCOUNTER — Telehealth: Payer: Self-pay | Admitting: Hematology and Oncology

## 2017-11-04 ENCOUNTER — Inpatient Hospital Stay (HOSPITAL_BASED_OUTPATIENT_CLINIC_OR_DEPARTMENT_OTHER): Payer: BLUE CROSS/BLUE SHIELD | Admitting: Hematology and Oncology

## 2017-11-04 ENCOUNTER — Inpatient Hospital Stay: Payer: BLUE CROSS/BLUE SHIELD | Attending: Hematology and Oncology

## 2017-11-04 ENCOUNTER — Other Ambulatory Visit: Payer: Self-pay | Admitting: Hematology and Oncology

## 2017-11-04 VITALS — BP 141/91 | HR 62 | Temp 98.4°F | Resp 20 | Ht 71.5 in | Wt 201.3 lb

## 2017-11-04 DIAGNOSIS — C8339 Diffuse large B-cell lymphoma, extranodal and solid organ sites: Secondary | ICD-10-CM

## 2017-11-04 DIAGNOSIS — D472 Monoclonal gammopathy: Secondary | ICD-10-CM | POA: Diagnosis not present

## 2017-11-04 DIAGNOSIS — G4730 Sleep apnea, unspecified: Secondary | ICD-10-CM

## 2017-11-04 LAB — CMP (CANCER CENTER ONLY)
ALBUMIN: 3.8 g/dL (ref 3.5–5.0)
ALK PHOS: 68 U/L (ref 38–126)
ALT: 25 U/L (ref 0–44)
ANION GAP: 5 (ref 5–15)
AST: 29 U/L (ref 15–41)
BILIRUBIN TOTAL: 0.4 mg/dL (ref 0.3–1.2)
BUN: 16 mg/dL (ref 8–23)
CALCIUM: 9.2 mg/dL (ref 8.9–10.3)
CO2: 30 mmol/L (ref 22–32)
Chloride: 105 mmol/L (ref 98–111)
Creatinine: 1 mg/dL (ref 0.61–1.24)
GFR, Est AFR Am: >60 mL/min (ref >60)
GFR, Estimated: >60 mL/min (ref >60)
Glucose, Bld: 86 mg/dL (ref 70–99)
Potassium: 4.4 mmol/L (ref 3.5–5.1)
Sodium: 140 mmol/L (ref 135–145)
TOTAL PROTEIN: 7.7 g/dL (ref 6.5–8.1)

## 2017-11-04 LAB — CBC WITH DIFFERENTIAL (CANCER CENTER ONLY)
BASOS ABS: 0 10*3/uL (ref 0.0–0.1)
BASOS PCT: 0 %
EOS ABS: 0.1 10*3/uL (ref 0.0–0.5)
EOS PCT: 3 %
HCT: 36.5 % — ABNORMAL LOW (ref 38.4–49.9)
Hemoglobin: 12.6 g/dL — ABNORMAL LOW (ref 13.0–17.1)
Lymphocytes Relative: 41 %
Lymphs Abs: 1.7 10*3/uL (ref 0.9–3.3)
MCH: 30.9 pg (ref 27.2–33.4)
MCHC: 34.5 g/dL (ref 32.0–36.0)
MCV: 89.5 fL (ref 79.3–98.0)
Monocytes Absolute: 0.4 10*3/uL (ref 0.1–0.9)
Monocytes Relative: 10 %
Neutro Abs: 2 10*3/uL (ref 1.5–6.5)
Neutrophils Relative %: 46 %
PLATELETS: 129 10*3/uL — AB (ref 140–400)
RBC: 4.08 MIL/uL — ABNORMAL LOW (ref 4.20–5.82)
RDW: 13.4 % (ref 11.0–14.6)
WBC Count: 4.3 10*3/uL (ref 4.0–10.3)

## 2017-11-04 LAB — LACTATE DEHYDROGENASE: LDH: 170 U/L (ref 98–192)

## 2017-11-04 LAB — MAGNESIUM: Magnesium: 1.8 mg/dL (ref 1.7–2.4)

## 2017-11-04 MED ORDER — ZOLPIDEM TARTRATE 10 MG PO TABS: 10.0000 mg | ORAL_TABLET | Freq: Every evening | ORAL | 3 refills | Status: DC | PRN

## 2017-11-04 NOTE — Progress Notes (Signed)
Patient Care Team: Margo Common, MD as PCP - General (Internal Medicine) Clydene Fake, MD as Referring Physician (Hematology and Oncology)  DIAGNOSIS:  Encounter Diagnosis  Name Primary?  . Diffuse large B-cell lymphoma of extranodal site ([place])     SUMMARY OF ONCOLOGIC HISTORY:   Diffuse large B-cell lymphoma of extranodal site ([place])   10/17/2015 Initial Diagnosis    Small intestine resection, proximal ileum: Involvement by diffuse large B-cell lymphoma,. Biopsy of peritoneal implant: Invol by a DLBCL.positive for CD20, CD79a,CD10, bcl-6, and bcl-2. CD3 and CD5 highlight scattered admixed T-cells. CD34 and TdT neg    10/31/2015 Pathology Results    Bone marrow biopsy: Hypercellular bone marrow for age 69-60% with trilineage hematopoiesis, plasma cells 9% with Kappa Light chain excess    11/02/2015 PET scan    Nodular thickening within the mesenteries of small bowel 17 mm concerning for peritoneal implant of high-grade lymphoma, activity along the abdominal wound with inflammation suspicion for infection/abscess/fistula of bladder    11/02/2015 Imaging    Ultrasound scrotum: Normal testes, no epididymitis, no varicocele or hydrocele    11/17/2015 - 03/01/2016 Chemotherapy    R CHOP 6 cycles    04/15/2016 PET scan    No residual abnormal hypermetabolic activity to suggest any residual lymphoma. Prior mesenteric activity has resolved. Metabolic activity left central prostate gland. Retracted left testicle       CHIEF COMPLIANT: Follow-up of lymphoma  INTERVAL HISTORY: Edward Steele is a 62 year old with above-mentioned history of small bowel diffuse large B-cell lymphoma who was treated with initially surgery to resect the lymphoma but followed by 6 cycles of R-CHOP chemotherapy.  Follow-up scans did not show any evidence of disease.  He is here for follow-up to review blood work and to talk about subsequent treatment strategy.  His energy levels have recovered to baseline.  He does  not have any abdominal pain nausea or vomiting.  His bowels are moving appropriately.  He has however not been exercising at all.  He has had lots of stresses at work.  REVIEW OF SYSTEMS:   Constitutional: Denies fevers, chills or abnormal weight loss Eyes: Denies blurriness of vision Ears, nose, mouth, throat, and face: Denies mucositis or sore throat Respiratory: Denies cough, dyspnea or wheezes Cardiovascular: Denies palpitation, chest discomfort Gastrointestinal:  Denies nausea, heartburn or change in bowel habits Skin: Denies abnormal skin rashes Lymphatics: Denies new lymphadenopathy or easy bruising Neurological:Denies numbness, tingling or new weaknesses Behavioral/Psych: Mood is stable, no new changes  Extremities: No lower extremity edema s All other systems were reviewed with the patient and are negative.  I have reviewed the past medical history, past surgical history, social history and family history with the patient and they are unchanged from previous note.  ALLERGIES:  is allergic to penicillin g.  MEDICATIONS:  Current Outpatient Medications  Medication Sig Dispense Refill  . acetaminophen (TYLENOL) 325 MG tablet Take 650 mg by mouth every 6 (six) hours as needed for mild pain.    Marland Kitchen albuterol (PROVENTIL HFA;VENTOLIN HFA) 108 (90 Base) MCG/ACT inhaler Inhale 2 puffs into the lungs every 6 (six) hours as needed for wheezing or shortness of breath.     . clonazePAM (KLONOPIN) 1 MG tablet Take 1 mg by mouth 2 (two) times daily.    Marland Kitchen DEXILANT 60 MG capsule Take 60 mg by mouth daily.    Marland Kitchen FLUoxetine (PROZAC) 20 MG tablet Take 40 mg by mouth 2 (two) times daily.    Marland Kitchen gabapentin (  NEURONTIN) 300 MG capsule Take 1 capsule (300 mg total) by mouth at bedtime as needed (nerve pain). 30 capsule 0  . meloxicam (MOBIC) 7.5 MG tablet Take 1 tablet (7.5 mg total) by mouth daily.    . Multiple Vitamin (MULTIVITAMIN WITH MINERALS) TABS tablet Take 1 tablet by mouth daily.    .  rosuvastatin (CRESTOR) 5 MG tablet Take 5 mg by mouth every evening.     . zolpidem (AMBIEN) 10 MG tablet Take 1 tablet (10 mg total) by mouth at bedtime as needed for sleep. 30 tablet 3   No current facility-administered medications for this visit.     PHYSICAL EXAMINATION: ECOG PERFORMANCE STATUS: 1 - Symptomatic but completely ambulatory  Vitals:   11/04/17 1418  BP: (!) 141/91  Pulse: 62  Resp: 20  Temp: 98.4 F (36.9 C)  SpO2: 100%   Filed Weights   11/04/17 1418  Weight: 201 lb 4.8 oz (91.3 kg)    GENERAL:alert, no distress and comfortable SKIN: skin color, texture, turgor are normal, no rashes or significant lesions EYES: normal, Conjunctiva are pink and non-injected, sclera clear OROPHARYNX:no exudate, no erythema and lips, buccal mucosa, and tongue normal  NECK: supple, thyroid normal size, non-tender, without nodularity LYMPH:  no palpable lymphadenopathy in the cervical, axillary or inguinal LUNGS: clear to auscultation and percussion with normal breathing effort HEART: regular rate & rhythm and no murmurs and no lower extremity edema ABDOMEN:abdomen soft, non-tender and normal bowel sounds MUSCULOSKELETAL:no cyanosis of digits and no clubbing  NEURO: alert & oriented x 3 with fluent speech, no focal motor/sensory deficits EXTREMITIES: No lower extremity edema   LABORATORY DATA:  I have reviewed the data as listed CMP Latest Ref Rng & Units 04/21/2017 04/18/2017 10/18/2016  Glucose 70 - 140 mg/dL 79 86 99  BUN 7 - 26 mg/dL 19 20 19.0  Creatinine 0.70 - 1.30 mg/dL 1.08 1.00 0.9  Sodium 136 - 145 mmol/L 139 142 139  Potassium 3.5 - 5.1 mmol/L 4.1 3.9 4.3  Chloride 98 - 109 mmol/L 102 106 -  CO2 22 - 29 mmol/L '28 27 25  ' Calcium 8.4 - 10.4 mg/dL 9.3 8.9 9.3  Total Protein 6.4 - 8.3 g/dL 7.8 7.5 7.2  Total Bilirubin 0.2 - 1.2 mg/dL 0.7 0.7 0.44  Alkaline Phos 40 - 150 U/L 73 71 74  AST 5 - 34 U/L '29 28 26  ' ALT 0 - 55 U/L '23 24 24    ' Lab Results  Component  Value Date   WBC 4.3 11/04/2017   HGB 12.6 (L) 11/04/2017   HCT 36.5 (L) 11/04/2017   MCV 89.5 11/04/2017   PLT 129 (L) 11/04/2017   NEUTROABS 2.0 11/04/2017    ASSESSMENT & PLAN:  Diffuse large B-cell lymphoma of extranodal site ([place]) Small intestine resection, proximal ileum08/10/2015: Involvement by diffuse large B-cell lymphoma,. Biopsy of peritoneal implant: Invol by a DLBCL.positive for CD20, CD79a,CD10, bcl-6, and bcl-2. CD3 and CD5 highlight scattered admixed T-cells. CD34 and TdT neg  PET/CT 11/02/2015: Nodular thickening within the mesenteries of small bowel 17 mm concerning for peritoneal implant of high-grade lymphoma, activity along the abdominal wound with inflammation suspicion for infection/abscess/fistula of bladder Testicular ultrasound: Normal  Bone marrow biopsy08/20/2017: Hypercellular bone marrow for age 85-60% with trilineage hematopoiesis, plasma cells 9% with Kappa Light chain excess, SPEP 1.5 gm M-Protein Monoclonal paraproteinemia: Bone survey showed bone lytic lesions.Elevated M protein MRI Rt Hip: Negative Referredto [facility] for evaluation and for a second opinion reg the M  protein and lytic lesions: They agreed that he does not have myeloma ----------------------------------------------------------------------------------------------------------------- DLBCL: Prognosis: Revised IPI score: 1 (4 year disease free survival rate: 80%, estimated overall survival 79%) If I uses age of less than or equal to 60, revised IPI score 27, 57-year-old DFS 94%, estimated overall survival 94%  Treatment summary: R-CHOP chemotherapy 6 cycles started 09/08/2017completed 03/01/2016 PET/CT scan 04/15/2016:No residual abnormal hypermetabolic activity PET CT scan 05/01/2017: No evidence of lymphoma recurrence, postsurgical changes distal small bowel.  Labs reviewed.  No clinical evidence of lymphoma recurrence. We will obtain a CT of the chest abdomen and pelvis and follow-up  in 6 months.    No orders of the defined types were placed in this encounter.  The patient has a good understanding of the overall plan. he agrees with it. he will call with any problems that may develop before the next visit here.   Harriette Ohara, MD 11/04/17

## 2017-11-04 NOTE — Assessment & Plan Note (Signed)
Small intestine resection, proximal ileum08/10/2015: Involvement by diffuse large B-cell lymphoma,. Biopsy of peritoneal implant: Invol by a DLBCL.positive for CD20, CD79a,CD10, bcl-6, and bcl-2. CD3 and CD5 highlight scattered admixed T-cells. CD34 and TdT neg  PET/CT 11/02/2015: Nodular thickening within the mesenteries of small bowel 17 mm concerning for peritoneal implant of high-grade lymphoma, activity along the abdominal wound with inflammation suspicion for infection/abscess/fistula of bladder Testicular ultrasound: Normal  Bone marrow biopsy08/20/2017: Hypercellular bone marrow for age 16-60% with trilineage hematopoiesis, plasma cells 9% with Kappa Light chain excess, SPEP 1.5 gm M-Protein Monoclonal paraproteinemia: Bone survey showed bone lytic lesions.Elevated M protein MRI Rt Hip: Negative Referredto [facility] for evaluation and for a second opinion reg the M protein and lytic lesions: They agreed that he does not have myeloma ----------------------------------------------------------------------------------------------------------------- DLBCL: Prognosis: Revised IPI score: 1 (4 year disease free survival rate: 80%, estimated overall survival 79%) If I uses age of less than or equal to 60, revised IPI score 37, 62-year-old DFS 94%, estimated overall survival 94%  Treatment summary: R-CHOP chemotherapy 6 cycles started 09/08/2017completed 03/01/2016 PET/CT scan 04/15/2016:No residual abnormal hypermetabolic activity ------------------------------------------------------------------------- PET CT scan 05/01/2017: No evidence of lymphoma recurrence, postsurgical changes distal small bowel.  Labs reviewed.  No clinical evidence of lymphoma recurrence. We will obtain a CT of the abdomen and follow-up in 6 months.

## 2017-11-04 NOTE — Telephone Encounter (Signed)
Gave patient avs and calendar.  Also gave contrast for CT.  Sleep study noted.

## 2017-11-05 ENCOUNTER — Other Ambulatory Visit: Payer: Self-pay | Admitting: Nurse Practitioner

## 2017-11-05 ENCOUNTER — Telehealth: Payer: Self-pay | Admitting: Hematology and Oncology

## 2017-11-05 LAB — MULTIPLE MYELOMA PANEL, SERUM
ALBUMIN/GLOB SERPL: 1.1 (ref 0.7–1.7)
ALPHA2 GLOB SERPL ELPH-MCNC: 0.6 g/dL (ref 0.4–1.0)
Albumin SerPl Elph-Mcnc: 3.7 g/dL (ref 2.9–4.4)
Alpha 1: 0.2 g/dL (ref 0.0–0.4)
B-Globulin SerPl Elph-Mcnc: 1 g/dL (ref 0.7–1.3)
GAMMA GLOB SERPL ELPH-MCNC: 1.8 g/dL (ref 0.4–1.8)
Globulin, Total: 3.5 g/dL (ref 2.2–3.9)
IGG (IMMUNOGLOBIN G), SERUM: 1844 mg/dL — AB (ref 700–1600)
IgA: 91 mg/dL (ref 61–437)
IgM (Immunoglobulin M), Srm: 60 mg/dL (ref 20–172)
M Protein SerPl Elph-Mcnc: 1.5 g/dL — ABNORMAL HIGH
Total Protein ELP: 7.2 g/dL (ref 6.0–8.5)

## 2017-11-05 LAB — KAPPA/LAMBDA LIGHT CHAINS
Kappa free light chain: 15.5 mg/L (ref 3.3–19.4)
Kappa, lambda light chain ratio: 1.48 (ref 0.26–1.65)
LAMDA FREE LIGHT CHAINS: 10.5 mg/L (ref 5.7–26.3)

## 2017-11-05 NOTE — Telephone Encounter (Signed)
I called the patient and inform him that his M protein is 1.5 which is only minimally increased from 6 months ago which was 1.4.  IgG level is stable.  No light chain increase.

## 2017-12-04 DIAGNOSIS — D649 Anemia, unspecified: Secondary | ICD-10-CM | POA: Diagnosis not present

## 2017-12-04 DIAGNOSIS — C8339 Diffuse large B-cell lymphoma, extranodal and solid organ sites: Secondary | ICD-10-CM | POA: Diagnosis not present

## 2017-12-04 DIAGNOSIS — E7800 Pure hypercholesterolemia, unspecified: Secondary | ICD-10-CM | POA: Diagnosis not present

## 2017-12-04 DIAGNOSIS — Z23 Encounter for immunization: Secondary | ICD-10-CM | POA: Diagnosis not present

## 2017-12-04 DIAGNOSIS — D472 Monoclonal gammopathy: Secondary | ICD-10-CM | POA: Diagnosis not present

## 2017-12-16 ENCOUNTER — Ambulatory Visit (HOSPITAL_BASED_OUTPATIENT_CLINIC_OR_DEPARTMENT_OTHER): Payer: BLUE CROSS/BLUE SHIELD | Attending: Hematology and Oncology | Admitting: Internal Medicine

## 2017-12-16 VITALS — Ht 70.5 in | Wt 197.0 lb

## 2017-12-16 DIAGNOSIS — G4730 Sleep apnea, unspecified: Secondary | ICD-10-CM

## 2017-12-16 DIAGNOSIS — G4733 Obstructive sleep apnea (adult) (pediatric): Secondary | ICD-10-CM | POA: Diagnosis not present

## 2017-12-27 DIAGNOSIS — G4730 Sleep apnea, unspecified: Secondary | ICD-10-CM

## 2017-12-27 NOTE — Procedures (Signed)
    Patient Name: Edward Steele, Edward Steele Date: 12/16/2017 Gender: Male D.O.B: January 10, 1956 Age (years): 74 Referring Provider: Nicholas Lose Height (inches): 71 Interpreting Physician: Baird Lyons MD, ABSM Weight (lbs): 197 RPSGT: Carolin Coy BMI: 28 MRN: [id number] Neck Size: 16.00  CLINICAL INFORMATION Sleep Study Type: NPS Indication for sleep study: Daytime Fatigue, Fatigue, Snoring Epworth Sleepiness Score: 7  SLEEP STUDY TECHNIQUE As per the AASM Manual for the Scoring of Sleep and Associated Events v2.3 (April 2016) with a hypopnea requiring 4% desaturations.  The channels recorded and monitored were frontal, central and occipital EEG, electrooculogram (EOG), submentalis EMG (chin), nasal and oral airflow, thoracic and abdominal wall motion, anterior tibialis EMG, snore microphone, electrocardiogram, and pulse oximetry.  MEDICATIONS Medications self-administered by patient taken the night of the study : CLONAZEPAM, GABAPENTIN  SLEEP ARCHITECTURE The study was initiated at 9:42:16 PM and ended at 4:26:24 AM.  Sleep onset time was 41.0 minutes and the sleep efficiency was 72.9%%. The total sleep time was 294.5 minutes.  Stage REM latency was 285.0 minutes.  The patient spent 24.4%% of the night in stage N1 sleep, 67.4%% in stage N2 sleep, 0.0%% in stage N3 and 8.2% in REM.  Alpha intrusion was absent.  Supine sleep was 39.00%.  RESPIRATORY PARAMETERS The overall apnea/hypopnea index (AHI) was 20.4 per hour. There were 78 total apneas, including 78 obstructive, 0 central and 0 mixed apneas. There were 22 hypopneas and 4 RERAs.  The AHI during Stage REM sleep was 45.0 per hour.  AHI while supine was 48.1 per hour.  The mean oxygen saturation was 94.1%. The minimum SpO2 during sleep was 84.0%.  moderate snoring was noted during this study.  CARDIAC DATA The 2 lead EKG demonstrated sinus rhythm. The mean heart rate was 61.9 beats per minute. Other EKG findings  include: None.  LEG MOVEMENT DATA The total PLMS were 0 with a resulting PLMS index of 0.0. Associated arousal with leg movement index was 1.2 .  IMPRESSIONS - Moderate obstructive sleep apnea occurred during this study (AHI = 20.4/h). - No significant central sleep apnea occurred during this study (CAI = 0.0/h). - Mild oxygen desaturation was noted during this study (Min O2 = 84.0%). Mean sat 94.1%. - The patient snored with moderate snoring volume. - No cardiac abnormalities were noted during this study. - Clinically significant periodic limb movements did not occur during sleep. No significant associated arousals.  DIAGNOSIS - Obstructive Sleep Apnea (327.23 [G47.33 ICD-10])  RECOMMENDATIONS - Suggest CPAP titration sleep study or DME autopap. Sleep Medical consultation or other options including a fitted oral appliance or ENT evaluation would be based on clinical judgment. - Positional therapy avoiding supine position during sleep. - Be careful with alcohol, sedatives and other CNS depressants that may worsen sleep apnea and disrupt normal sleep architecture. - Sleep hygiene should be reviewed to assess factors that may improve sleep quality. - Weight management and regular exercise should be initiated or continued if appropriate.  [Electronically signed] 12/27/2017 12:39 PM  Baird Lyons MD, [place], American Board of Sleep Medicine   NPI: [id number]                         [place], [place] [facility]  ELECTRONICALLY SIGNED ON:  12/27/2017, 12:37 PM [place] PH: [phone]   FX: [phone] [place]

## 2017-12-30 ENCOUNTER — Other Ambulatory Visit: Payer: Self-pay

## 2017-12-30 ENCOUNTER — Telehealth: Payer: Self-pay

## 2017-12-30 DIAGNOSIS — G4733 Obstructive sleep apnea (adult) (pediatric): Secondary | ICD-10-CM

## 2017-12-30 NOTE — Telephone Encounter (Signed)
Appointment has been scheduled with Dr. Annamaria Boots at [facility] for 01/02/2018 @ 9:30 am.  Eartha Inch at 9:15am.  Left message for patient to call back to confirm appointment time and date.

## 2017-12-30 NOTE — Telephone Encounter (Signed)
Spoke with patient, informed of appointment with Dr. Baird Lyons.  Contact information given to patient if rescheduling is needed.  No further needs at this time.

## 2017-12-30 NOTE — Progress Notes (Signed)
Per Dr. Lindi Adie recommendations - referral placed for pulmonology to Dr. Baird Lyons for obstructive sleep apnea.

## 2018-01-02 ENCOUNTER — Institutional Professional Consult (permissible substitution): Payer: BLUE CROSS/BLUE SHIELD | Admitting: Internal Medicine

## 2018-02-09 ENCOUNTER — Ambulatory Visit (INDEPENDENT_AMBULATORY_CARE_PROVIDER_SITE_OTHER): Payer: BLUE CROSS/BLUE SHIELD | Admitting: Internal Medicine

## 2018-02-09 ENCOUNTER — Encounter: Payer: Self-pay | Admitting: Internal Medicine

## 2018-02-09 VITALS — BP 126/68 | HR 71 | Ht 70.0 in | Wt 206.0 lb

## 2018-02-09 DIAGNOSIS — J209 Acute bronchitis, unspecified: Secondary | ICD-10-CM | POA: Diagnosis not present

## 2018-02-09 DIAGNOSIS — G4733 Obstructive sleep apnea (adult) (pediatric): Secondary | ICD-10-CM

## 2018-02-09 MED ORDER — AZITHROMYCIN 250 MG PO TABS: ORAL_TABLET | ORAL | 0 refills | Status: DC

## 2018-02-09 NOTE — Assessment & Plan Note (Signed)
He indicates some background persistent dry cough with a chest x-ray within the last year by his PCP.  In the last 2 weeks he has had a chest cold which is improved but with lingering green/yellow discolored sputum.  We are going to see if a Z-Pak can speed up resolution at this point.

## 2018-02-09 NOTE — Progress Notes (Signed)
02/09/2018-62 year old male never smoker for sleep evaluation referred courtesy of Dr Lindi Adie. 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 NPSG 12/16/17-  AHI 20.4/hour, desaturation to 84%, body weight 197 lbs Medical problem list includes iron deficiency anemia, large B-cell lymphoma/chemotherapy induced peripheral  Neuropathy, GERD, kidney stones, MGUS, recurrent sinus infections, s/p small bowel resection -----referral from oncology for OSA.  Sleep study done 12/23/17 at [facility].   Albuterol HFA, Epworth score 7 He describes loud snoring, witnessed apneas, some daytime tiredness.  It may take him 45 minutes to fall asleep and he tends to wake unrefreshed.  Wife confirms. History of ENT surgery-septoplasty and he has been told he has chronic sinus disease.  Degenerative disc disease with C-spine surgery.  Keeps a dry cough at baseline without diagnosed lung disease but has been very slow getting over a chest cold now with persistent green/yellow sputum.  Family history that an uncle snored loudly.  Prior to Admission medications   Medication Sig Start Date End Date Taking? Authorizing Provider  acetaminophen (TYLENOL) 325 MG tablet Take 650 mg by mouth every 6 (six) hours as needed for mild pain.   Yes [provider]  albuterol (PROVENTIL HFA;VENTOLIN HFA) 108 (90 Base) MCG/ACT inhaler Inhale 2 puffs into the lungs every 6 (six) hours as needed for wheezing or shortness of breath.    Yes [provider]  clonazePAM (KLONOPIN) 1 MG tablet Take 1 mg by mouth 2 (two) times daily. 09/18/15  Yes [provider]  DEXILANT 60 MG capsule Take 60 mg by mouth daily. 07/21/15  Yes [provider]  FLUoxetine (PROZAC) 20 MG tablet Take 40 mg by mouth 2 (two) times daily. 07/28/15  Yes [provider]  gabapentin (NEURONTIN) 300 MG capsule Take 1 capsule (300 mg total) by mouth at bedtime as needed (nerve pain). 04/21/17  Yes Nicholas Lose, MD  meloxicam (MOBIC) 7.5 MG tablet Take 1  tablet (7.5 mg total) by mouth daily. 04/21/17  Yes Nicholas Lose, MD  rosuvastatin (CRESTOR) 5 MG tablet Take 5 mg by mouth every evening.  08/28/15  Yes [provider]  azithromycin (ZITHROMAX Z-PAK) 250 MG tablet Take 2 tabs today, then 1 daily until gone. 02/09/18   Deneise Lever, MD  Multiple Vitamin (MULTIVITAMIN WITH MINERALS) TABS tablet Take 1 tablet by mouth daily.    [provider]   Past Medical History:  Diagnosis Date  . Anemia    oral iron supplement used  . Anxiety   . Cancer ([place])    skin cancer - face basal and squamous  . Chronic back pain    lower back  . Constipation   . Depression   . GERD (gastroesophageal reflux disease)   . Head injury, closed, with concussion   . History of kidney stones    x 4 episodes-no recent issues  . Keratoconus    left eye  . Lymphoma ([place])    chemo  4 and 1/2 months last chemo Mar 01, 2016  . Personal history of chemotherapy 2017   lymphoma   . Pneumonia    age 24  . Recurrent sinus infections    past history-none recent " Inhalers were for this- no use im many yrs"   Past Surgical History:  Procedure Laterality Date  . ABDOMINAL WALL DEFECT REPAIR N/A 11/08/2015   Procedure: EXPLORE LOWER  ABDOMINAL WALL WITH BIOPSY;  Surgeon: Ralene Ok, MD;  Location: [place];  Service: General;  Laterality: N/A;  . ANTERIOR FUSION CERVICAL SPINE  x 2  . BACK SURGERY     lower back with screws and rods  . BOWEL RESECTION N/A 10/17/2015   Procedure: EXPLORATORY LAPAROTOMY, SMALL BOWEL RESECTION, WITH ANASTAMOSIS;  Surgeon: Ralene Ok, MD;  Location: [facility];  Service: General;  Laterality: N/A;  . CATARACT EXTRACTION, BILATERAL Bilateral   . EYE SURGERY Left    corneal tranplant  . LAPAROSCOPY N/A 10/17/2015   Procedure: LAPAROSCOPY DIAGNOSTIC;  Surgeon: Ralene Ok, MD;  Location: [facility];  Service: General;  Laterality: N/A;  . PORT-A-CATH REMOVAL Left 05/02/2016   Procedure: REMOVAL PORT-A-CATH;  Surgeon:  Ralene Ok, MD;  Location: [facility];  Service: General;  Laterality: Left;  . PORTACATH PLACEMENT N/A 11/08/2015   Procedure: INSERTION PORT-A-CATH;  Surgeon: Ralene Ok, MD;  Location: [facility];  Service: General;  Laterality: N/A;   Family History  Problem Relation Age of Onset  . Breast cancer Sister    Social History   Socioeconomic History  . Marital status: Married    Spouse name: Not on file  . Number of children: Not on file  . Years of education: Not on file  . Highest education level: Not on file  Occupational History  . Not on file  Social Needs  . Financial resource strain: Not on file  . Food insecurity:    Worry: Not on file    Inability: Not on file  . Transportation needs:    Medical: Not on file    Non-medical: Not on file  Tobacco Use  . Smoking status: Never Smoker  . Smokeless tobacco: Never Used  Substance and Sexual Activity  . Alcohol use: No  . Drug use: No  . Sexual activity: Yes  Lifestyle  . Physical activity:    Days per week: Not on file    Minutes per session: Not on file  . Stress: Not on file  Relationships  . Social connections:    Talks on phone: Not on file    Gets together: Not on file    Attends religious service: Not on file    Active member of club or organization: Not on file    Attends meetings of clubs or organizations: Not on file    Relationship status: Not on file  . Intimate partner violence:    Fear of current or ex partner: Not on file    Emotionally abused: Not on file    Physically abused: Not on file    Forced sexual activity: Not on file  Other Topics Concern  . Not on file  Social History Narrative  . Not on file   ROS-see HPI   + = positive Constitutional:    weight loss, night sweats, fevers, chills, +fatigue, lassitude. HEENT:    headaches, difficulty swallowing, tooth/dental problems, sore throat,       sneezing, itching, ear ache, nasal congestion, post nasal drip, snoring CV:    chest pain,  orthopnea, PND, swelling in lower extremities, anasarca,                                  dizziness, palpitations Resp:   shortness of breath with exertion or at rest.                +productive cough,   non-productive cough, coughing up of blood.              +change in color of mucus.  wheezing.  Skin:    rash or lesions. GI:  No-   heartburn, indigestion, abdominal pain, nausea, vomiting, diarrhea,                 change in bowel habits, loss of appetite GU: dysuria, change in color of urine, no urgency or frequency.   flank pain. MS:   joint pain, stiffness, decreased range of motion, back pain. Neuro-     nothing unusual Psych:  change in mood or affect.  depression or anxiety.   memory loss.  OBJ- Physical Exam General- Alert, Oriented, Affect-appropriate, Distress- none acute, not obese Skin- rash-none, lesions- none, excoriation- none Lymphadenopathy- none Head- atraumatic            Eyes- Gross vision intact, PERRLA, conjunctivae and secretions clear            Ears- Hearing, canals-normal            Nose- Clear, no-Septal dev, mucus, polyps, erosion, perforation             Throat- Mallampati III-IV , mucosa clear , drainage- none, tonsils- atrophic, + own teeth with repair Neck- flexible , trachea midline, no stridor , thyroid nl, carotid no bruit Chest - symmetrical excursion , unlabored           Heart/CV- RRR , no murmur , no gallop  , no rub, nl s1 s2                           - JVD- none , edema- none, stasis changes- none, varices- none           Lung- clear to P&A, wheeze- none, cough- none , dullness-none, rub- none           Chest wall-  Abd-  Br/ Gen/ Rectal- Not done, not indicated Extrem- cyanosis- none, clubbing, none, atrophy- none, strength- nl Neuro- grossly intact to observation

## 2018-02-09 NOTE — Assessment & Plan Note (Signed)
Moderately severe obstructive sleep apnea.  We discussed the physiology, medical concerns and treatments.  He also describes some difficulty initiating sleep.  We can reassess this after we treat the obstructive apnea component. Plan-start CPAP auto 5-20

## 2018-02-09 NOTE — Patient Instructions (Signed)
Order- new DME, new CPAP auto 5-20, mask of choice, humidifier, supplies, [place]   Please call if we can help

## 2018-02-14 DIAGNOSIS — J111 Influenza due to unidentified influenza virus with other respiratory manifestations: Secondary | ICD-10-CM | POA: Diagnosis not present

## 2018-02-14 DIAGNOSIS — M791 Myalgia, unspecified site: Secondary | ICD-10-CM | POA: Diagnosis not present

## 2018-02-24 DIAGNOSIS — G4733 Obstructive sleep apnea (adult) (pediatric): Secondary | ICD-10-CM | POA: Diagnosis not present

## 2018-03-27 DIAGNOSIS — G4733 Obstructive sleep apnea (adult) (pediatric): Secondary | ICD-10-CM | POA: Diagnosis not present

## 2018-04-21 ENCOUNTER — Telehealth: Payer: Self-pay | Admitting: Hematology and Oncology

## 2018-04-21 NOTE — Telephone Encounter (Signed)
[facility] CME 2/28 moved f/u to 2/27. Spoke with patient.

## 2018-04-27 DIAGNOSIS — G4733 Obstructive sleep apnea (adult) (pediatric): Secondary | ICD-10-CM | POA: Diagnosis not present

## 2018-05-01 ENCOUNTER — Other Ambulatory Visit: Payer: Self-pay

## 2018-05-01 DIAGNOSIS — C8339 Diffuse large B-cell lymphoma, extranodal and solid organ sites: Secondary | ICD-10-CM

## 2018-05-05 ENCOUNTER — Encounter (HOSPITAL_COMMUNITY): Payer: Self-pay

## 2018-05-05 ENCOUNTER — Ambulatory Visit (HOSPITAL_COMMUNITY)
Admission: RE | Admit: 2018-05-05 | Discharge: 2018-05-05 | Disposition: A | Discharge status: Home / Self Care | Payer: BLUE CROSS/BLUE SHIELD | Source: Ambulatory Visit | Attending: Hematology and Oncology | Admitting: Hematology and Oncology

## 2018-05-05 ENCOUNTER — Inpatient Hospital Stay: Payer: BLUE CROSS/BLUE SHIELD | Attending: Hematology and Oncology

## 2018-05-05 DIAGNOSIS — C8339 Diffuse large B-cell lymphoma, extranodal and solid organ sites: Secondary | ICD-10-CM | POA: Diagnosis not present

## 2018-05-05 DIAGNOSIS — C8330 Diffuse large B-cell lymphoma, unspecified site: Secondary | ICD-10-CM | POA: Diagnosis not present

## 2018-05-05 DIAGNOSIS — Z8572 Personal history of non-Hodgkin lymphomas: Secondary | ICD-10-CM | POA: Insufficient documentation

## 2018-05-05 DIAGNOSIS — D472 Monoclonal gammopathy: Secondary | ICD-10-CM

## 2018-05-05 LAB — CMP (CANCER CENTER ONLY)
ALBUMIN: 4 g/dL (ref 3.5–5.0)
ALK PHOS: 69 U/L (ref 38–126)
ALT: 30 U/L (ref 0–44)
AST: 34 U/L (ref 15–41)
Anion gap: 7 (ref 5–15)
BILIRUBIN TOTAL: 0.6 mg/dL (ref 0.3–1.2)
BUN: 20 mg/dL (ref 8–23)
CHLORIDE: 104 mmol/L (ref 98–111)
CO2: 28 mmol/L (ref 22–32)
Calcium: 9.2 mg/dL (ref 8.9–10.3)
Creatinine: 1.07 mg/dL (ref 0.61–1.24)
GFR, Est AFR Am: >60 mL/min (ref >60)
GFR, Estimated: >60 mL/min (ref >60)
GLUCOSE: 107 mg/dL — AB (ref 70–99)
Potassium: 4.3 mmol/L (ref 3.5–5.1)
Sodium: 139 mmol/L (ref 135–145)
Total Protein: 8.2 g/dL — ABNORMAL HIGH (ref 6.5–8.1)

## 2018-05-05 LAB — CBC WITH DIFFERENTIAL (CANCER CENTER ONLY)
Abs Immature Granulocytes: 0 10*3/uL (ref 0.00–0.07)
BASOS ABS: 0 10*3/uL (ref 0.0–0.1)
Basophils Relative: 1 %
Eosinophils Absolute: 0.1 10*3/uL (ref 0.0–0.5)
Eosinophils Relative: 1 %
HEMATOCRIT: 38.5 % — AB (ref 39.0–52.0)
HEMOGLOBIN: 12.4 g/dL — AB (ref 13.0–17.0)
IMMATURE GRANULOCYTES: 0 %
LYMPHS ABS: 1.1 10*3/uL (ref 0.7–4.0)
LYMPHS PCT: 30 %
MCH: 28.7 pg (ref 26.0–34.0)
MCHC: 32.2 g/dL (ref 30.0–36.0)
MCV: 89.1 fL (ref 80.0–100.0)
Monocytes Absolute: 0.3 10*3/uL (ref 0.1–1.0)
Monocytes Relative: 8 %
NEUTROS PCT: 60 %
NRBC: 0 % (ref 0.0–0.2)
Neutro Abs: 2.3 10*3/uL (ref 1.7–7.7)
Platelet Count: 164 10*3/uL (ref 150–400)
RBC: 4.32 MIL/uL (ref 4.22–5.81)
RDW: 12.5 % (ref 11.5–15.5)
WBC Count: 3.8 10*3/uL — ABNORMAL LOW (ref 4.0–10.5)

## 2018-05-05 MED ORDER — SODIUM CHLORIDE (PF) 0.9 % IJ SOLN
INTRAMUSCULAR | Status: AC
  Filled 2018-05-05: qty 50

## 2018-05-05 MED ORDER — IOHEXOL 300 MG/ML  SOLN
100.0000 mL | Freq: Once | INTRAMUSCULAR | Status: AC | PRN
  Administered 2018-05-05: 100 mL via INTRAVENOUS

## 2018-05-06 LAB — KAPPA/LAMBDA LIGHT CHAINS
Kappa free light chain: 15.2 mg/L (ref 3.3–19.4)
Kappa, lambda light chain ratio: 1.62 (ref 0.26–1.65)
Lambda free light chains: 9.4 mg/L (ref 5.7–26.3)

## 2018-05-06 NOTE — Progress Notes (Signed)
Patient Care Team: Margo Common, MD as PCP - General (Internal Medicine) Clydene Fake, MD as Referring Physician (Hematology and Oncology)  DIAGNOSIS:    ICD-10-CM   1. Diffuse large B-cell lymphoma of extranodal site ([place]) C83.39 CBC with Differential ([place])    CMP ([place] only)    Multiple Myeloma Panel (SPEP&IFE w/QIG)    Kappa/lambda light chains    SUMMARY OF ONCOLOGIC HISTORY:   Diffuse large B-cell lymphoma of extranodal site ([place])   10/17/2015 Initial Diagnosis    Small intestine resection, proximal ileum: Involvement by diffuse large B-cell lymphoma,. Biopsy of peritoneal implant: Invol by a DLBCL.positive for CD20, CD79a,CD10, bcl-6, and bcl-2. CD3 and CD5 highlight scattered admixed T-cells. CD34 and TdT neg    10/31/2015 Pathology Results    Bone marrow biopsy: Hypercellular bone marrow for age 40-60% with trilineage hematopoiesis, plasma cells 9% with Kappa Light chain excess    11/02/2015 PET scan    Nodular thickening within the mesenteries of small bowel 17 mm concerning for peritoneal implant of high-grade lymphoma, activity along the abdominal wound with inflammation suspicion for infection/abscess/fistula of bladder    11/02/2015 Imaging    Ultrasound scrotum: Normal testes, no epididymitis, no varicocele or hydrocele    11/17/2015 - 03/01/2016 Chemotherapy    R CHOP 6 cycles    04/15/2016 PET scan    No residual abnormal hypermetabolic activity to suggest any residual lymphoma. Prior mesenteric activity has resolved. Metabolic activity left central prostate gland. Retracted left testicle      CHIEF COMPLIANT: Follow-up of diffuse large B-cell lymphoma  INTERVAL HISTORY: Edward Steele is a 63 y.o. with above-mentioned history of small bowel diffuse large B-cell lymphoma who was initially treated with surgery to resect the lymphoma, which was followed by 6 cycles of R-CHOP chemotherapy. A CT CAP on 05/05/18 showed no evidence of malignancy  and an enlarged prostate. He presents to the clinic today with his wife and is doing well, but notes a lot of stress at work. He notes occasional lower back pain and groin pain. He denies any fevers, chills, or night sweats. He is on a CPAP machine after his recent sleep study showed moderate sleep apnea. His labs from 05/05/18 show: WBC 3.8, Hg 12.4, platelets 164, total protein 8.2, creatinine 1.07, kappa light chain 15.2, lamda light chain 9.4, m-protein pending. He reviewed his medication list with me.   REVIEW OF SYSTEMS:   Constitutional: Denies fevers, chills or abnormal weight loss Eyes: Denies blurriness of vision Ears, nose, mouth, throat, and face: Denies mucositis or sore throat Respiratory: Denies cough, dyspnea or wheezes Cardiovascular: Denies palpitation, chest discomfort Gastrointestinal: Denies nausea, heartburn or change in bowel habits Skin: Denies abnormal skin rashes MSK: (+) occasional back and groin pain Lymphatics: Denies new lymphadenopathy or easy bruising Neurological: Denies numbness, tingling or new weaknesses Behavioral/Psych: Mood is stable, no new changes  Extremities: No lower extremity edema All other systems were reviewed with the patient and are negative.  I have reviewed the past medical history, past surgical history, social history and family history with the patient and they are unchanged from previous note.  ALLERGIES:  is allergic to penicillin g.  MEDICATIONS:  Current Outpatient Medications  Medication Sig Dispense Refill  . acetaminophen (TYLENOL) 325 MG tablet Take 650 mg by mouth every 6 (six) hours as needed for mild pain.    Marland Kitchen albuterol (PROVENTIL HFA;VENTOLIN HFA) 108 (90 Base) MCG/ACT inhaler Inhale 2 puffs into the lungs every  6 (six) hours as needed for wheezing or shortness of breath.     Marland Kitchen azithromycin (ZITHROMAX Z-PAK) 250 MG tablet Take 2 tabs today, then 1 daily until gone. 6 each 0  . clonazePAM (KLONOPIN) 1 MG tablet Take 1 mg by  mouth 2 (two) times daily.    Marland Kitchen DEXILANT 60 MG capsule Take 60 mg by mouth daily.    Marland Kitchen FLUoxetine (PROZAC) 20 MG tablet Take 40 mg by mouth 2 (two) times daily.    Marland Kitchen gabapentin (NEURONTIN) 300 MG capsule Take 1 capsule (300 mg total) by mouth at bedtime as needed (nerve pain). 30 capsule 0  . meloxicam (MOBIC) 7.5 MG tablet Take 1 tablet (7.5 mg total) by mouth daily.    . Multiple Vitamin (MULTIVITAMIN WITH MINERALS) TABS tablet Take 1 tablet by mouth daily.    . rosuvastatin (CRESTOR) 5 MG tablet Take 5 mg by mouth every evening.      No current facility-administered medications for this visit.     PHYSICAL EXAMINATION: ECOG PERFORMANCE STATUS: 1 - Symptomatic but completely ambulatory  Vitals:   05/07/18 1410  BP: 127/70  Pulse: 71  Resp: 18  Temp: (!) 97.4 F (36.3 C)  SpO2: 100%   Filed Weights   05/07/18 1410  Weight: 206 lb 12.8 oz (93.8 kg)    GENERAL: alert, no distress and comfortable SKIN: skin color, texture, turgor are normal, no rashes or significant lesions EYES: normal, Conjunctiva are pink and non-injected, sclera clear OROPHARYNX: no exudate, no erythema and lips, buccal mucosa, and tongue normal  NECK: supple, thyroid normal size, non-tender, without nodularity LYMPH: no palpable lymphadenopathy in the cervical, axillary or inguinal LUNGS: clear to auscultation and percussion with normal breathing effort HEART: regular rate & rhythm and no murmurs and no lower extremity edema ABDOMEN: abdomen soft, non-tender and normal bowel sounds  MUSCULOSKELETAL: no cyanosis of digits and no clubbing  NEURO: alert & oriented x 3 with fluent speech, no focal motor/sensory deficits EXTREMITIES: No lower extremity edema  LABORATORY DATA:  I have reviewed the data as listed CMP Latest Ref Rng & Units 05/05/2018 11/04/2017 04/21/2017  Glucose 70 - 99 mg/dL 107(H) 86 79  BUN 8 - 23 mg/dL '20 16 19  ' Creatinine 0.61 - 1.24 mg/dL 1.07 1.00 1.08  Sodium 135 - 145 mmol/L 139 140  139  Potassium 3.5 - 5.1 mmol/L 4.3 4.4 4.1  Chloride 98 - 111 mmol/L 104 105 102  CO2 22 - 32 mmol/L '28 30 28  ' Calcium 8.9 - 10.3 mg/dL 9.2 9.2 9.3  Total Protein 6.5 - 8.1 g/dL 8.2(H) 7.7 7.8  Total Bilirubin 0.3 - 1.2 mg/dL 0.6 0.4 0.7  Alkaline Phos 38 - 126 U/L 69 68 73  AST 15 - 41 U/L 34 29 29  ALT 0 - 44 U/L '30 25 23    ' Lab Results  Component Value Date   WBC 3.8 (L) 05/05/2018   HGB 12.4 (L) 05/05/2018   HCT 38.5 (L) 05/05/2018   MCV 89.1 05/05/2018   PLT 164 05/05/2018   NEUTROABS 2.3 05/05/2018    ASSESSMENT & PLAN:  Diffuse large B-cell lymphoma of extranodal site ([place]) Small intestine resection, proximal ileum08/10/2015: Involvement by diffuse large B-cell lymphoma,. Biopsy of peritoneal implant: Invol by a DLBCL.positive for CD20, CD79a,CD10, bcl-6, and bcl-2. CD3 and CD5 highlight scattered admixed T-cells. CD34 and TdT neg  PET/CT 11/02/2015: Nodular thickening within the mesenteries of small bowel 17 mm concerning for peritoneal implant of high-grade lymphoma,  activity along the abdominal wound with inflammation suspicion for infection/abscess/fistula of bladder Testicular ultrasound: Normal  Bone marrow biopsy08/20/2017: Hypercellular bone marrow for age 54-60% with trilineage hematopoiesis, plasma cells 9% with Kappa Light chain excess, SPEP 1.5 gm M-Protein Monoclonal paraproteinemia: Bone survey showed bone lytic lesions.Elevated M protein MRI Rt Hip: Negative Referredto [facility] for evaluation and for a second opinion reg the M protein and lytic lesions: They agreed that he does not have myeloma KL ratio is normal.  Awaiting SPEP ----------------------------------------------------------------------------------------------------------------- DLBCL: Prognosis: Revised IPI score: 1 (4 year disease free survival rate: 80%, estimated overall survival 79%) If I uses age of less than or equal to 60, revised IPI score 87, 32-year-old DFS 94%, estimated overall  survival 94%  Treatment summary: R-CHOP chemotherapy 6 cycles started 09/08/2017completed 03/01/2016 PET/CT scan 04/15/2016:No residual abnormal hypermetabolic activity  PET CT scan 05/01/2017: No evidence of lymphoma recurrence, postsurgical changes distal small bowel. CT chest abdomen pelvis 04/30/2018: No evidence of lymphoma recurrence Plan to perform another CT scan in 1 year.  Labs reviewed.  No clinical evidence of lymphoma recurrence. Return to clinic in 6 months with labs and follow-up.    Orders Placed This Encounter  Procedures  . CBC with Differential ([facility] Only)    Standing Status:   Future    Standing Expiration Date:   05/08/2019  . CMP ([place] only)    Standing Status:   Future    Standing Expiration Date:   05/08/2019  . Multiple Myeloma Panel (SPEP&IFE w/QIG)    Standing Status:   Future    Standing Expiration Date:   06/11/2019  . Kappa/lambda light chains    Standing Status:   Future    Standing Expiration Date:   06/11/2019   The patient has a good understanding of the overall plan. he agrees with it. he will call with any problems that may develop before the next visit here.  Nicholas Lose, MD 05/07/2018  Edward Steele am acting as scribe for Dr. Nicholas Lose.  I have reviewed the above documentation for accuracy and completeness, and I agree with the above.

## 2018-05-07 ENCOUNTER — Telehealth: Payer: Self-pay | Admitting: Hematology and Oncology

## 2018-05-07 ENCOUNTER — Other Ambulatory Visit: Payer: BLUE CROSS/BLUE SHIELD

## 2018-05-07 ENCOUNTER — Inpatient Hospital Stay (HOSPITAL_BASED_OUTPATIENT_CLINIC_OR_DEPARTMENT_OTHER): Payer: BLUE CROSS/BLUE SHIELD | Admitting: Hematology and Oncology

## 2018-05-07 VITALS — BP 127/70 | HR 71 | Temp 97.4°F | Resp 18 | Ht 70.0 in | Wt 206.8 lb

## 2018-05-07 DIAGNOSIS — Z8572 Personal history of non-Hodgkin lymphomas: Secondary | ICD-10-CM | POA: Diagnosis not present

## 2018-05-07 DIAGNOSIS — C8339 Diffuse large B-cell lymphoma, extranodal and solid organ sites: Secondary | ICD-10-CM

## 2018-05-07 NOTE — Telephone Encounter (Signed)
Gave avs and calendar ° °

## 2018-05-07 NOTE — Assessment & Plan Note (Signed)
Small intestine resection, proximal ileum08/10/2015: Involvement by diffuse large B-cell lymphoma,. Biopsy of peritoneal implant: Invol by a DLBCL.positive for CD20, CD79a,CD10, bcl-6, and bcl-2. CD3 and CD5 highlight scattered admixed T-cells. CD34 and TdT neg  PET/CT 11/02/2015: Nodular thickening within the mesenteries of small bowel 17 mm concerning for peritoneal implant of high-grade lymphoma, activity along the abdominal wound with inflammation suspicion for infection/abscess/fistula of bladder Testicular ultrasound: Normal  Bone marrow biopsy08/20/2017: Hypercellular bone marrow for age 16-60% with trilineage hematopoiesis, plasma cells 9% with Kappa Light chain excess, SPEP 1.5 gm M-Protein Monoclonal paraproteinemia: Bone survey showed bone lytic lesions.Elevated M protein MRI Rt Hip: Negative Referredto [facility] for evaluation and for a second opinion reg the M protein and lytic lesions: They agreed that he does not have myeloma KL ratio is normal.  Awaiting SPEP ----------------------------------------------------------------------------------------------------------------- DLBCL: Prognosis: Revised IPI score: 1 (4 year disease free survival rate: 80%, estimated overall survival 79%) If I uses age of less than or equal to 60, revised IPI score 42, 63-year-old DFS 94%, estimated overall survival 94%  Treatment summary: R-CHOP chemotherapy 6 cycles started 09/08/2017completed 03/01/2016 PET/CT scan 04/15/2016:No residual abnormal hypermetabolic activity ------------------------------------------------------------------------- PET CT scan 05/01/2017: No evidence of lymphoma recurrence, postsurgical changes distal small bowel.  CT chest abdomen pelvis: No evidence of lymphoma recurrence Plan to perform another CT scan in 1 year.  Labs reviewed.  No clinical evidence of lymphoma recurrence.

## 2018-05-08 ENCOUNTER — Other Ambulatory Visit: Payer: BLUE CROSS/BLUE SHIELD

## 2018-05-08 ENCOUNTER — Ambulatory Visit: Payer: BLUE CROSS/BLUE SHIELD | Admitting: Hematology and Oncology

## 2018-05-08 LAB — MULTIPLE MYELOMA PANEL, SERUM
ALBUMIN/GLOB SERPL: 1 (ref 0.7–1.7)
ALPHA 1: 0.2 g/dL (ref 0.0–0.4)
ALPHA2 GLOB SERPL ELPH-MCNC: 0.6 g/dL (ref 0.4–1.0)
Albumin SerPl Elph-Mcnc: 3.8 g/dL (ref 2.9–4.4)
B-Globulin SerPl Elph-Mcnc: 1 g/dL (ref 0.7–1.3)
GAMMA GLOB SERPL ELPH-MCNC: 2 g/dL — AB (ref 0.4–1.8)
GLOBULIN, TOTAL: 3.9 g/dL (ref 2.2–3.9)
IgA: 101 mg/dL (ref 61–437)
IgG (Immunoglobin G), Serum: 2010 mg/dL — ABNORMAL HIGH (ref 700–1600)
IgM (Immunoglobulin M), Srm: 74 mg/dL (ref 20–172)
M Protein SerPl Elph-Mcnc: 1.4 g/dL — ABNORMAL HIGH
Total Protein ELP: 7.7 g/dL (ref 6.0–8.5)

## 2018-05-11 ENCOUNTER — Encounter: Payer: Self-pay | Admitting: Internal Medicine

## 2018-05-11 ENCOUNTER — Ambulatory Visit (INDEPENDENT_AMBULATORY_CARE_PROVIDER_SITE_OTHER): Payer: BLUE CROSS/BLUE SHIELD | Admitting: Internal Medicine

## 2018-05-11 VITALS — BP 136/88 | HR 71 | Ht 70.5 in | Wt 203.6 lb

## 2018-05-11 DIAGNOSIS — G4733 Obstructive sleep apnea (adult) (pediatric): Secondary | ICD-10-CM | POA: Diagnosis not present

## 2018-05-11 DIAGNOSIS — J209 Acute bronchitis, unspecified: Secondary | ICD-10-CM | POA: Diagnosis not present

## 2018-05-11 NOTE — Assessment & Plan Note (Signed)
Currently free of active bronchitis symptoms. Complicated medical history so he will report if problems.

## 2018-05-11 NOTE — Patient Instructions (Signed)
Order- DME [facility]-  Please change autopap range to 5-15, shorten ramp to 5 minutes, and refit mask of choice for bette seal.                  Please provide 2 week download and check card- this one was blank.   Please call as needed

## 2018-05-11 NOTE — Progress Notes (Signed)
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 NPSG 12/16/17-  AHI 20.4/hour, desaturation to 84%, body weight 197 lbs Medical problem list includes iron deficiency anemia, large B-cell lymphoma/chemotherapy induced peripheral  Neuropathy, GERD, kidney stones, MGUS, recurrent sinus infections, s/p small bowel resection -----referral from oncology for OSA.  Sleep study done 12/23/17 at [facility].   Albuterol HFA, Epworth score 7 He describes loud snoring, witnessed apneas, some daytime tiredness.  It may take him 45 minutes to fall asleep and he tends to wake unrefreshed.  Wife confirms. History of ENT surgery-septoplasty and he has been told he has chronic sinus disease.  Degenerative disc disease with C-spine surgery.  Keeps a dry cough at baseline without diagnosed lung disease but has been very slow getting over a chest cold now with persistent green/yellow sputum.  Family history that an uncle snored loudly.  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 They had checked his card several weeks ago.  CPAP 5-20/ [facility] Initial pressure seems low.  Later there is mask leak esp when he is lying on side.  Fairly comfortable. Wife confirms he seems to sleep comfortably without snoring.   ROS-see HPI   + = positive Constitutional:    weight loss, night sweats, fevers, chills, +fatigue, lassitude. HEENT:    headaches, difficulty swallowing, tooth/dental problems, sore throat,       sneezing, itching, ear ache, nasal congestion, post nasal drip, snoring CV:    chest pain, orthopnea, PND, swelling in lower extremities, anasarca,                                  dizziness, palpitations Resp:   shortness of breath with exertion or at rest.                productive cough,   non-productive cough, coughing up of blood.              change in color of mucus.  wheezing.   Skin:    rash or lesions. GI:  No-   heartburn, indigestion, abdominal pain, nausea, vomiting, diarrhea,                 change in bowel habits, loss of appetite GU: dysuria, change in color of urine, no urgency or frequency.   flank pain. MS:   joint pain, stiffness, decreased range of motion, back pain. Neuro-     nothing unusual Psych:  change in mood or affect.  depression or anxiety.   memory loss.  OBJ- Physical Exam General- Alert, Oriented, Affect-appropriate, Distress- none acute, not obese Skin- rash-none, lesions- none, excoriation- none Lymphadenopathy- none Head- atraumatic            Eyes- Gross vision intact, PERRLA, conjunctivae and secretions clear  Ears- Hearing, canals-normal            Nose- Clear, no-Septal dev, mucus, polyps, erosion, perforation             Throat- Mallampati III-IV , mucosa clear , drainage- none, tonsils- atrophic, + own teeth with repair Neck- flexible , trachea midline, no stridor , thyroid nl, carotid no bruit Chest - symmetrical excursion , unlabored           Heart/CV- RRR , no murmur , no gallop  , no rub, nl s1 s2                           - JVD- none , edema- none, stasis changes- none, varices- none            Lung- clear to P&A, wheeze- none, cough- none , dullness-none, rub- none           Chest wall-  Abd-  Br/ Gen/ Rectal- Not done, not indicated Extrem- cyanosis- none, clubbing, none, atrophy- none, strength- nl Neuro- grossly intact to observation

## 2018-05-11 NOTE — Assessment & Plan Note (Signed)
Seems to have gotten started with CPAP and benefiting at this point. Need download report. Needs mask refitted for better seal and we will try lowering peak pressure.  Plan change to auto 5-15, download, may need to shorten ramp, refit mask

## 2018-05-20 ENCOUNTER — Encounter: Payer: Self-pay | Admitting: Internal Medicine

## 2018-06-05 IMAGING — CT NM PET TUM IMG RESTAG (PS) SKULL BASE T - THIGH
1 of 7 series · 1 of 25 positions shown · non-contrast
Comparison: 11/02/2015

CLINICAL DATA: Subsequent treatment strategy for diffuse B-cell
lymphoma.

EXAM:
NUCLEAR MEDICINE PET SKULL BASE TO THIGH
TECHNIQUE: 9.7 mCi F-18 FDG was injected intravenously. Full-ring PET imaging
was performed from the skull base to thigh after the radiotracer. CT
data was obtained and used for attenuation correction and anatomic
localization.
FASTING BLOOD GLUCOSE:  Value: 107 mg/dl

[Series 4: ct sk_thigh 5.0 b31f · axial · 5.0mm · 0.98mm/px · 1 of 233 slices shown]
[im 233/233  brain]
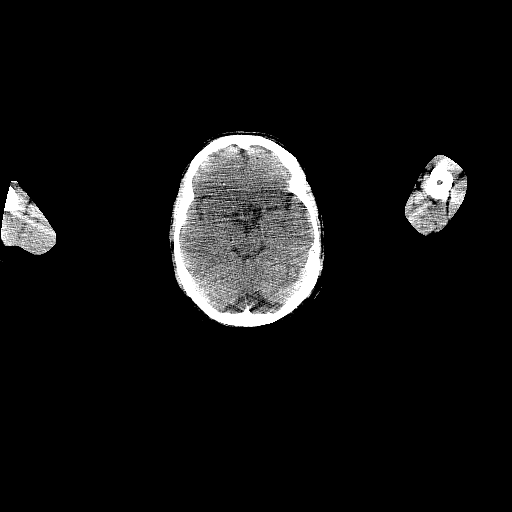

[1 of 25 positions shown; findings below may reference images not displayed]

FINDINGS: NECK

No hypermetabolic lymph nodes in the neck.

CHEST

No hypermetabolic mediastinal or hilar nodes. No suspicious
pulmonary nodules on the CT scan.

Mediastinal background blood pool activity 3.5.

ABDOMEN/PELVIS

No abnormal hypermetabolic activity within the liver, pancreas,
adrenal glands, or spleen. No hypermetabolic lymph nodes in the
abdomen or pelvis.

The focus of hypermetabolic activity along the mesentery in the
midline of the pelvis is no longer apparent either as a soft tissue
lesion or as a hypermetabolic focus. Moreover, the previous high
metabolic activity along the wound site has resolved.

There is accentuated metabolic activity in the left side of the
prostate gland in the vicinity of a notable central gland prostate
calcification.

The left testicle is retracted from the scrotal sac to the left
inguinal region but not all the way to the inguinal ring.

Normal volume of the spleen.

Hepatic background blood pool activity 4.7.

SKELETON

No focal hypermetabolic activity to suggest skeletal metastasis.
Incidental mild inflammatory activity along the left glenohumeral
joint. Postoperative findings in the lumbar spine with prior right
iliac bone graft harvesting.

On recent metastatic bone survey, note was made of a right femoral
neck lesion. This is not hypermetabolic today, and on the CT data
the appearance is most compatible with a benign synovial herniation
pit.
IMPRESSION: 1. No residual abnormal hypermetabolic activity to suggest active
malignancy in the neck, chest, abdomen, or pelvis. Prior mesenteric
activity and activity along the prior wound have resolved.
2. Benign synovial herniation pit in the right femoral neck accounts
for the prior lucency seen on radiography.
3. Increased metabolic activity in the left central prostate gland
compared to prior. This seems a bit too far to the left of midline
could represent urine within the prostatic urethra. Correlate with
PSA level.
4. Newly retracted left testicle into the left inguinal region but
not all the way to the inguinal ring. This may be a transient
phenomenon.

## 2018-06-06 ENCOUNTER — Encounter: Payer: Self-pay | Admitting: Internal Medicine

## 2018-06-24 DIAGNOSIS — G4733 Obstructive sleep apnea (adult) (pediatric): Secondary | ICD-10-CM | POA: Diagnosis not present

## 2018-06-29 DIAGNOSIS — G4733 Obstructive sleep apnea (adult) (pediatric): Secondary | ICD-10-CM | POA: Diagnosis not present

## 2018-07-11 DIAGNOSIS — S5001XA Contusion of right elbow, initial encounter: Secondary | ICD-10-CM | POA: Diagnosis not present

## 2018-07-11 DIAGNOSIS — S5011XA Contusion of right forearm, initial encounter: Secondary | ICD-10-CM | POA: Diagnosis not present

## 2018-07-16 DIAGNOSIS — F419 Anxiety disorder, unspecified: Secondary | ICD-10-CM | POA: Diagnosis not present

## 2018-07-31 DIAGNOSIS — R198 Other specified symptoms and signs involving the digestive system and abdomen: Secondary | ICD-10-CM | POA: Diagnosis not present

## 2018-08-01 ENCOUNTER — Observation Stay (HOSPITAL_BASED_OUTPATIENT_CLINIC_OR_DEPARTMENT_OTHER)
Admission: EM | Admit: 2018-08-01 | Discharge: 2018-08-03 | DRG: 661 | Disposition: A | Discharge status: Home / Self Care | Payer: BLUE CROSS/BLUE SHIELD | Attending: Urology | Admitting: Urology

## 2018-08-01 ENCOUNTER — Emergency Department (HOSPITAL_COMMUNITY): Payer: BLUE CROSS/BLUE SHIELD | Admitting: Registered Nurse

## 2018-08-01 ENCOUNTER — Emergency Department (HOSPITAL_BASED_OUTPATIENT_CLINIC_OR_DEPARTMENT_OTHER): Payer: BLUE CROSS/BLUE SHIELD

## 2018-08-01 ENCOUNTER — Encounter (HOSPITAL_BASED_OUTPATIENT_CLINIC_OR_DEPARTMENT_OTHER): Payer: Self-pay

## 2018-08-01 ENCOUNTER — Encounter (HOSPITAL_COMMUNITY)
Admission: EM | Disposition: A | Discharge status: Home / Self Care | Payer: Self-pay | Source: Home / Self Care | Attending: Emergency Medicine

## 2018-08-01 ENCOUNTER — Other Ambulatory Visit: Payer: Self-pay

## 2018-08-01 ENCOUNTER — Inpatient Hospital Stay (HOSPITAL_COMMUNITY): Payer: BLUE CROSS/BLUE SHIELD

## 2018-08-01 DIAGNOSIS — Z1159 Encounter for screening for other viral diseases: Secondary | ICD-10-CM | POA: Insufficient documentation

## 2018-08-01 DIAGNOSIS — F419 Anxiety disorder, unspecified: Secondary | ICD-10-CM | POA: Diagnosis not present

## 2018-08-01 DIAGNOSIS — I700 Atherosclerosis of aorta: Secondary | ICD-10-CM | POA: Diagnosis not present

## 2018-08-01 DIAGNOSIS — Z85828 Personal history of other malignant neoplasm of skin: Secondary | ICD-10-CM | POA: Diagnosis not present

## 2018-08-01 DIAGNOSIS — Z9842 Cataract extraction status, left eye: Secondary | ICD-10-CM | POA: Diagnosis not present

## 2018-08-01 DIAGNOSIS — N390 Urinary tract infection, site not specified: Secondary | ICD-10-CM

## 2018-08-01 DIAGNOSIS — Z87442 Personal history of urinary calculi: Secondary | ICD-10-CM | POA: Insufficient documentation

## 2018-08-01 DIAGNOSIS — K219 Gastro-esophageal reflux disease without esophagitis: Secondary | ICD-10-CM | POA: Diagnosis not present

## 2018-08-01 DIAGNOSIS — Z947 Corneal transplant status: Secondary | ICD-10-CM | POA: Diagnosis not present

## 2018-08-01 DIAGNOSIS — K5900 Constipation, unspecified: Secondary | ICD-10-CM | POA: Diagnosis not present

## 2018-08-01 DIAGNOSIS — G4733 Obstructive sleep apnea (adult) (pediatric): Secondary | ICD-10-CM | POA: Diagnosis not present

## 2018-08-01 DIAGNOSIS — G8929 Other chronic pain: Secondary | ICD-10-CM | POA: Insufficient documentation

## 2018-08-01 DIAGNOSIS — K449 Diaphragmatic hernia without obstruction or gangrene: Secondary | ICD-10-CM | POA: Diagnosis not present

## 2018-08-01 DIAGNOSIS — M545 Low back pain: Secondary | ICD-10-CM | POA: Insufficient documentation

## 2018-08-01 DIAGNOSIS — R1032 Left lower quadrant pain: Secondary | ICD-10-CM | POA: Diagnosis not present

## 2018-08-01 DIAGNOSIS — R000 Tachycardia, unspecified: Secondary | ICD-10-CM | POA: Diagnosis not present

## 2018-08-01 DIAGNOSIS — N201 Calculus of ureter: Secondary | ICD-10-CM | POA: Diagnosis not present

## 2018-08-01 DIAGNOSIS — J209 Acute bronchitis, unspecified: Secondary | ICD-10-CM | POA: Diagnosis not present

## 2018-08-01 DIAGNOSIS — Z9841 Cataract extraction status, right eye: Secondary | ICD-10-CM | POA: Insufficient documentation

## 2018-08-01 DIAGNOSIS — Z880 Allergy status to penicillin: Secondary | ICD-10-CM | POA: Insufficient documentation

## 2018-08-01 DIAGNOSIS — Z9221 Personal history of antineoplastic chemotherapy: Secondary | ICD-10-CM | POA: Insufficient documentation

## 2018-08-01 DIAGNOSIS — Z7951 Long term (current) use of inhaled steroids: Secondary | ICD-10-CM | POA: Insufficient documentation

## 2018-08-01 DIAGNOSIS — N136 Pyonephrosis: Principal | ICD-10-CM | POA: Insufficient documentation

## 2018-08-01 DIAGNOSIS — D649 Anemia, unspecified: Secondary | ICD-10-CM | POA: Insufficient documentation

## 2018-08-01 DIAGNOSIS — Z803 Family history of malignant neoplasm of breast: Secondary | ICD-10-CM | POA: Insufficient documentation

## 2018-08-01 DIAGNOSIS — Z79899 Other long term (current) drug therapy: Secondary | ICD-10-CM | POA: Diagnosis not present

## 2018-08-01 DIAGNOSIS — F329 Major depressive disorder, single episode, unspecified: Secondary | ICD-10-CM | POA: Insufficient documentation

## 2018-08-01 DIAGNOSIS — Z8572 Personal history of non-Hodgkin lymphomas: Secondary | ICD-10-CM | POA: Insufficient documentation

## 2018-08-01 DIAGNOSIS — R55 Syncope and collapse: Secondary | ICD-10-CM | POA: Diagnosis not present

## 2018-08-01 DIAGNOSIS — R509 Fever, unspecified: Secondary | ICD-10-CM | POA: Diagnosis not present

## 2018-08-01 DIAGNOSIS — N132 Hydronephrosis with renal and ureteral calculous obstruction: Secondary | ICD-10-CM | POA: Diagnosis not present

## 2018-08-01 DIAGNOSIS — N202 Calculus of kidney with calculus of ureter: Secondary | ICD-10-CM | POA: Diagnosis not present

## 2018-08-01 HISTORY — PX: CYSTOSCOPY W/ URETERAL STENT PLACEMENT: SHX1429

## 2018-08-01 LAB — COMPREHENSIVE METABOLIC PANEL
ALT: 27 U/L (ref 0–44)
AST: 33 U/L (ref 15–41)
Albumin: 3.6 g/dL (ref 3.5–5.0)
Alkaline Phosphatase: 58 U/L (ref 38–126)
Anion gap: 8 (ref 5–15)
BUN: 25 mg/dL — ABNORMAL HIGH (ref 8–23)
CO2: 24 mmol/L (ref 22–32)
Calcium: 8.5 mg/dL — ABNORMAL LOW (ref 8.9–10.3)
Chloride: 100 mmol/L (ref 98–111)
Creatinine, Ser: 2.06 mg/dL — ABNORMAL HIGH (ref 0.61–1.24)
GFR calc Af Amer: 39 mL/min — ABNORMAL LOW (ref >60)
GFR calc non Af Amer: 33 mL/min — ABNORMAL LOW (ref >60)
Glucose, Bld: 125 mg/dL — ABNORMAL HIGH (ref 70–99)
Potassium: 4.7 mmol/L (ref 3.5–5.1)
Sodium: 132 mmol/L — ABNORMAL LOW (ref 135–145)
Total Bilirubin: 0.8 mg/dL (ref 0.3–1.2)
Total Protein: 6.9 g/dL (ref 6.5–8.1)

## 2018-08-01 LAB — CBC WITH DIFFERENTIAL/PLATELET
Abs Immature Granulocytes: 0.02 10*3/uL (ref 0.00–0.07)
Basophils Absolute: 0 10*3/uL (ref 0.0–0.1)
Basophils Relative: 0 %
Eosinophils Absolute: 0 10*3/uL (ref 0.0–0.5)
Eosinophils Relative: 1 %
HCT: 33.6 % — ABNORMAL LOW (ref 39.0–52.0)
Hemoglobin: 11.2 g/dL — ABNORMAL LOW (ref 13.0–17.0)
Immature Granulocytes: 0 %
Lymphocytes Relative: 4 %
Lymphs Abs: 0.2 10*3/uL — ABNORMAL LOW (ref 0.7–4.0)
MCH: 29.5 pg (ref 26.0–34.0)
MCHC: 33.3 g/dL (ref 30.0–36.0)
MCV: 88.4 fL (ref 80.0–100.0)
Monocytes Absolute: 0.5 10*3/uL (ref 0.1–1.0)
Monocytes Relative: 8 %
Neutro Abs: 4.8 10*3/uL (ref 1.7–7.7)
Neutrophils Relative %: 87 %
Platelets: 120 10*3/uL — ABNORMAL LOW (ref 150–400)
RBC: 3.8 MIL/uL — ABNORMAL LOW (ref 4.22–5.81)
RDW: 14 % (ref 11.5–15.5)
WBC: 5.5 10*3/uL (ref 4.0–10.5)
nRBC: 0 % (ref 0.0–0.2)

## 2018-08-01 LAB — URINALYSIS, MICROSCOPIC (REFLEX)

## 2018-08-01 LAB — URINALYSIS, ROUTINE W REFLEX MICROSCOPIC
Bilirubin Urine: NEGATIVE
Glucose, UA: NEGATIVE mg/dL
Ketones, ur: NEGATIVE mg/dL
Leukocytes,Ua: NEGATIVE
Nitrite: NEGATIVE
Protein, ur: NEGATIVE mg/dL
Specific Gravity, Urine: 1.015 (ref 1.005–1.030)
pH: 5.5 (ref 5.0–8.0)

## 2018-08-01 LAB — LACTIC ACID, PLASMA
Lactic Acid, Venous: 1.4 mmol/L (ref 0.5–1.9)
Lactic Acid, Venous: 1.2 mmol/L (ref 0.5–1.9)

## 2018-08-01 LAB — SARS CORONAVIRUS 2 AG (30 MIN TAT): SARS Coronavirus 2 Ag: NEGATIVE

## 2018-08-01 SURGERY — CYSTOSCOPY, WITH RETROGRADE PYELOGRAM AND URETERAL STENT INSERTION
Anesthesia: General | Laterality: Left

## 2018-08-01 MED ORDER — DEXAMETHASONE SODIUM PHOSPHATE 10 MG/ML IJ SOLN
INTRAMUSCULAR | Status: DC | PRN
  Administered 2018-08-01: 5 mg via INTRAVENOUS

## 2018-08-01 MED ORDER — OXYCODONE HCL 5 MG PO TABS: 5.0000 mg | ORAL_TABLET | Freq: Once | ORAL | Status: DC | PRN

## 2018-08-01 MED ORDER — ONDANSETRON HCL 4 MG/2ML IJ SOLN
INTRAMUSCULAR | Status: DC | PRN
  Administered 2018-08-01: 4 mg via INTRAVENOUS

## 2018-08-01 MED ORDER — SODIUM CHLORIDE 0.9 % IV SOLN
INTRAVENOUS | Status: AC
  Filled 2018-08-01: qty 10

## 2018-08-01 MED ORDER — PROPOFOL 10 MG/ML IV BOLUS
INTRAVENOUS | Status: DC | PRN
  Administered 2018-08-01: 170 mg via INTRAVENOUS

## 2018-08-01 MED ORDER — SODIUM CHLORIDE 0.9 % IV SOLN
1.0000 g | INTRAVENOUS | Status: DC
  Administered 2018-08-01: 15:00:00 1 g via INTRAVENOUS
  Filled 2018-08-01: qty 10

## 2018-08-01 MED ORDER — CLONAZEPAM 1 MG PO TABS
1.0000 mg | ORAL_TABLET | Freq: Two times a day (BID) | ORAL | Status: DC
  Administered 2018-08-01 – 2018-08-03 (×4): 1 mg via ORAL
  Filled 2018-08-01 (×4): qty 1

## 2018-08-01 MED ORDER — ONDANSETRON HCL 4 MG/2ML IJ SOLN: 4.0000 mg | Freq: Once | INTRAMUSCULAR | Status: DC | PRN

## 2018-08-01 MED ORDER — FENTANYL CITRATE (PF) 250 MCG/5ML IJ SOLN
INTRAMUSCULAR | Status: DC | PRN
  Administered 2018-08-01 (×2): 50 ug via INTRAVENOUS

## 2018-08-01 MED ORDER — OXYBUTYNIN CHLORIDE 5 MG PO TABS
5.0000 mg | ORAL_TABLET | Freq: Three times a day (TID) | ORAL | Status: DC | PRN
  Administered 2018-08-02: 5 mg via ORAL
  Filled 2018-08-01: qty 1

## 2018-08-01 MED ORDER — SODIUM CHLORIDE 0.9 % IV BOLUS
1000.0000 mL | Freq: Once | INTRAVENOUS | Status: AC
  Administered 2018-08-01: 15:00:00 1000 mL via INTRAVENOUS

## 2018-08-01 MED ORDER — SODIUM CHLORIDE 0.45 % IV SOLN
INTRAVENOUS | Status: DC
  Administered 2018-08-01: 21:00:00 via INTRAVENOUS

## 2018-08-01 MED ORDER — ONDANSETRON HCL 4 MG/2ML IJ SOLN
INTRAMUSCULAR | Status: AC
  Filled 2018-08-01: qty 2

## 2018-08-01 MED ORDER — ACETAMINOPHEN 325 MG PO TABS: 650.0000 mg | ORAL_TABLET | ORAL | Status: DC | PRN

## 2018-08-01 MED ORDER — ENOXAPARIN SODIUM 40 MG/0.4ML ~~LOC~~ SOLN
40.0000 mg | SUBCUTANEOUS | Status: DC
  Administered 2018-08-02 – 2018-08-03 (×2): 40 mg via SUBCUTANEOUS
  Filled 2018-08-01 (×2): qty 0.4

## 2018-08-01 MED ORDER — ROSUVASTATIN CALCIUM 5 MG PO TABS
5.0000 mg | ORAL_TABLET | Freq: Every evening | ORAL | Status: DC
  Administered 2018-08-02: 18:00:00 5 mg via ORAL
  Filled 2018-08-01: qty 1

## 2018-08-01 MED ORDER — FENTANYL CITRATE (PF) 100 MCG/2ML IJ SOLN: 25.0000 ug | INTRAMUSCULAR | Status: DC | PRN

## 2018-08-01 MED ORDER — FENTANYL CITRATE (PF) 250 MCG/5ML IJ SOLN
INTRAMUSCULAR | Status: AC
  Filled 2018-08-01: qty 5

## 2018-08-01 MED ORDER — DEXAMETHASONE SODIUM PHOSPHATE 10 MG/ML IJ SOLN
INTRAMUSCULAR | Status: AC
  Filled 2018-08-01: qty 1

## 2018-08-01 MED ORDER — SODIUM CHLORIDE 0.9 % IV SOLN
1.0000 g | INTRAVENOUS | Status: DC
  Administered 2018-08-02: 15:00:00 1 g via INTRAVENOUS
  Filled 2018-08-01: qty 1
  Filled 2018-08-01: qty 10

## 2018-08-01 MED ORDER — ONDANSETRON HCL 4 MG/2ML IJ SOLN: 4.0000 mg | INTRAMUSCULAR | Status: DC | PRN

## 2018-08-01 MED ORDER — LIDOCAINE 2% (20 MG/ML) 5 ML SYRINGE
INTRAMUSCULAR | Status: DC | PRN
  Administered 2018-08-01: 80 mg via INTRAVENOUS

## 2018-08-01 MED ORDER — EPHEDRINE SULFATE-NACL 50-0.9 MG/10ML-% IV SOSY
PREFILLED_SYRINGE | INTRAVENOUS | Status: DC | PRN
  Administered 2018-08-01 (×3): 10 mg via INTRAVENOUS

## 2018-08-01 MED ORDER — HYDROCODONE-ACETAMINOPHEN 5-325 MG PO TABS
1.0000 | ORAL_TABLET | ORAL | Status: DC | PRN
  Administered 2018-08-01: 2 via ORAL
  Administered 2018-08-02: 1 via ORAL
  Filled 2018-08-01: qty 2
  Filled 2018-08-01: qty 1

## 2018-08-01 MED ORDER — OXYCODONE HCL 5 MG/5ML PO SOLN: 5.0000 mg | Freq: Once | ORAL | Status: DC | PRN

## 2018-08-01 MED ORDER — FLUOXETINE HCL 20 MG PO CAPS
40.0000 mg | ORAL_CAPSULE | Freq: Two times a day (BID) | ORAL | Status: DC
  Administered 2018-08-02 – 2018-08-03 (×3): 40 mg via ORAL
  Filled 2018-08-01 (×3): qty 2

## 2018-08-01 MED ORDER — SODIUM CHLORIDE 0.9% FLUSH
3.0000 mL | Freq: Once | INTRAVENOUS | Status: DC
  Filled 2018-08-01: qty 3

## 2018-08-01 MED ORDER — ALBUTEROL SULFATE (2.5 MG/3ML) 0.083% IN NEBU: 2.5000 mg | INHALATION_SOLUTION | Freq: Four times a day (QID) | RESPIRATORY_TRACT | Status: DC | PRN

## 2018-08-01 MED ORDER — SODIUM CHLORIDE 0.9 % IV SOLN: 1.0000 g | INTRAVENOUS | Status: DC

## 2018-08-01 MED ORDER — LACTATED RINGERS IV SOLN
INTRAVENOUS | Status: DC | PRN
  Administered 2018-08-01 (×2): via INTRAVENOUS

## 2018-08-01 MED ORDER — SODIUM CHLORIDE 0.9 % IV SOLN
INTRAVENOUS | Status: DC | PRN
  Administered 2018-08-01: 19:00:00 50 mL

## 2018-08-01 MED ORDER — LIDOCAINE 2% (20 MG/ML) 5 ML SYRINGE
INTRAMUSCULAR | Status: AC
  Filled 2018-08-01: qty 5

## 2018-08-01 MED ORDER — PHENYLEPHRINE 40 MCG/ML (10ML) SYRINGE FOR IV PUSH (FOR BLOOD PRESSURE SUPPORT)
PREFILLED_SYRINGE | INTRAVENOUS | Status: DC | PRN
  Administered 2018-08-01 (×2): 80 ug via INTRAVENOUS

## 2018-08-01 MED ORDER — PANTOPRAZOLE SODIUM 40 MG PO TBEC
40.0000 mg | DELAYED_RELEASE_TABLET | Freq: Every day | ORAL | Status: DC
  Administered 2018-08-02 – 2018-08-03 (×2): 40 mg via ORAL
  Filled 2018-08-01 (×2): qty 1

## 2018-08-01 MED ORDER — PROPOFOL 10 MG/ML IV BOLUS
INTRAVENOUS | Status: AC
  Filled 2018-08-01: qty 20

## 2018-08-01 MED ORDER — SODIUM CHLORIDE 0.9 % IR SOLN
Status: DC | PRN
  Administered 2018-08-01: 3000 mL

## 2018-08-01 MED ORDER — MIDAZOLAM HCL 5 MG/5ML IJ SOLN
INTRAMUSCULAR | Status: DC | PRN
  Administered 2018-08-01: 2 mg via INTRAVENOUS

## 2018-08-01 MED ORDER — MELOXICAM 7.5 MG PO TABS
7.5000 mg | ORAL_TABLET | Freq: Every day | ORAL | Status: DC
  Administered 2018-08-02 – 2018-08-03 (×2): 7.5 mg via ORAL
  Filled 2018-08-01 (×3): qty 1

## 2018-08-01 MED ORDER — BISACODYL 10 MG RE SUPP
10.0000 mg | Freq: Every day | RECTAL | Status: DC | PRN
  Filled 2018-08-01: qty 1

## 2018-08-01 MED ORDER — MIDAZOLAM HCL 2 MG/2ML IJ SOLN
INTRAMUSCULAR | Status: AC
  Filled 2018-08-01: qty 2

## 2018-08-01 MED ORDER — GABAPENTIN 300 MG PO CAPS
300.0000 mg | ORAL_CAPSULE | Freq: Every evening | ORAL | Status: DC | PRN
  Administered 2018-08-01 – 2018-08-02 (×2): 300 mg via ORAL
  Filled 2018-08-01 (×2): qty 1

## 2018-08-01 MED ORDER — SENNA 8.6 MG PO TABS
1.0000 | ORAL_TABLET | Freq: Two times a day (BID) | ORAL | Status: DC
  Administered 2018-08-01 – 2018-08-03 (×4): 8.6 mg via ORAL
  Filled 2018-08-01 (×4): qty 1

## 2018-08-01 MED ORDER — ACETAMINOPHEN 325 MG PO TABS
650.0000 mg | ORAL_TABLET | Freq: Once | ORAL | Status: AC | PRN
  Administered 2018-08-01: 14:00:00 650 mg via ORAL
  Filled 2018-08-01: qty 2

## 2018-08-01 SURGICAL SUPPLY — 17 items
BAG URO CATCHER STRL LF (MISCELLANEOUS) ×3 IMPLANT
CATH INTERMIT  6FR 70CM (CATHETERS) ×3 IMPLANT
CLOTH BEACON ORANGE TIMEOUT ST (SAFETY) ×3 IMPLANT
COVER WAND RF STERILE (DRAPES) IMPLANT
GLOVE BIOGEL M 8.0 STRL (GLOVE) ×3 IMPLANT
GOWN STRL REUS W/ TWL XL LVL3 (GOWN DISPOSABLE) ×1 IMPLANT
GOWN STRL REUS W/TWL LRG LVL3 (GOWN DISPOSABLE) ×3 IMPLANT
GOWN STRL REUS W/TWL XL LVL3 (GOWN DISPOSABLE) ×2
GUIDEWIRE ANG ZIPWIRE 038X150 (WIRE) IMPLANT
GUIDEWIRE STR DUAL SENSOR (WIRE) ×3 IMPLANT
KIT TURNOVER KIT A (KITS) IMPLANT
MANIFOLD NEPTUNE II (INSTRUMENTS) ×3 IMPLANT
PACK CYSTO (CUSTOM PROCEDURE TRAY) ×3 IMPLANT
STENT CONTOUR 6FRX26X.038 (STENTS) ×3 IMPLANT
TUBING CONNECTING 10 (TUBING) ×2 IMPLANT
TUBING CONNECTING 10' (TUBING) ×1
TUBING UROLOGY SET (TUBING) IMPLANT

## 2018-08-01 NOTE — Op Note (Signed)
Preoperative diagnosis: Left distal ureteral stone with urinary tract infection  Postoperative diagnosis: Same  Principal procedure: Cystoscopy, left retrograde ureteropyelogram, fluoroscopic interpretation, placement of 6 French by 26 cm contour double-J stent without tether  Surgeon: Aliceson Dolbow  Anesthesia: General with LMA  Complications: None  Specimen: None  Estimated blood loss: None  Indications: 63 year old male presenting with an infected left distal ureteral stone earlier today.  He presents at this time for urgent stent placement in addition to hospitalization and antibiotic management.  Findings: Prostate was minimally obstructive with bilobar hypertrophy.  There were no urethral lesions.  Bladder was inspected circumferentially.  There were no urothelial lesions.  There were no trabeculations.  No foreign bodies were seen.  Both ureteral orifice ease were normal in their configuration and location.  Upon retrograde ureteropyelogram, there was colonization throughout the entire ureter.  No distinct ureteral filling defect was noted.  There was mild pyelocaliectasis on the left.  Description of procedure: The patient was properly identified in the holding area.  He was taken to the operating room following marking, and placed on the table.  General anesthetic was administered with the LMA and the patient was placed in the dorsolithotomy position.  Genitalia and perineum were prepped and draped.  Proper timeout was performed.  21 French panendoscope advanced to the urethra with the above-mentioned findings noted.  The left ureteral orifice was cannulated with the assistance of a sensor tip guidewire.  Retrograde ureteropyelogram was performed with the above-mentioned findings noted.  Following this, the guidewire was advanced to the open-ended catheter.  This was easily passed into the upper pole calyceal system.  Following this, the open-ended catheter was removed and the 6 [place] by  26 cm contour double-J stent with a tether removed was placed.  Once the guidewire was removed, there was excellent deployment with curl seen proximally and distally using fluoroscopy and cystoscopy, respectively.  During stent placement, a slight brownish sediment came from the ureteral orifice but no distinct stone seen.  The patient was then awakened and taken to the PACU in stable condition.  He tolerated the procedure well.

## 2018-08-01 NOTE — ED Notes (Signed)
Pt arrived [facility], 109/58-69-95% RA pain level 1/10

## 2018-08-01 NOTE — ED Notes (Signed)
Surgeon talking to patient.

## 2018-08-01 NOTE — ED Notes (Signed)
Patient is changing out of gown, wiping self with CHG wipes and putting new gown on.

## 2018-08-01 NOTE — ED Notes (Signed)
Belongings have been given to wife.

## 2018-08-01 NOTE — H&P (Signed)
H&P  Chief Complaint: Kidney stone, fever/UTI History of Present Illness: 63 year old male without prior urologic history presents at this time for admission to the hospital with placement of left double-J stent.  The patient presented to [facility] earlier today with a 1 day history of fever, left flank pain.  Upon presentation CT revealed a very small left distal ureteral stone with mild left hydroureteronephrosis, perinephric stranding.  There was pyuria present.  The patient has had a fever above 102 within the past 24 hours as well as shakes and chills.  I have discussed urgent placement of a double-J stent with the patient, IV antibiotic administration with gradual transition to p.o., with eventual trial of possible double-J stent extraction with giving this tiny stone a chance to pass without necessarily performing ureteroscopy in the future.  He understands the process that we will be doing tonight, cystoscopy, left retrograde and double-J stent placement.  Risks and benefits were discussed.  He desires to proceed.  Past Medical History:  Diagnosis Date  . Anemia    oral iron supplement used  . Anxiety   . Cancer ([place])    skin cancer - face basal and squamous  . Chronic back pain    lower back  . Constipation   . Depression   . GERD (gastroesophageal reflux disease)   . Head injury, closed, with concussion   . History of kidney stones    x 4 episodes-no recent issues  . Keratoconus    left eye  . Lymphoma ([place])    chemo  4 and 1/2 months last chemo Mar 01, 2016  . Personal history of chemotherapy 2017   lymphoma   . Pneumonia    age 35  . Recurrent sinus infections    past history-none recent " Inhalers were for this- no use im many yrs"    Past Surgical History:  Procedure Laterality Date  . ABDOMINAL WALL DEFECT REPAIR N/A 11/08/2015   Procedure: EXPLORE LOWER  ABDOMINAL WALL WITH BIOPSY;  Surgeon: Ralene Ok, MD;  Location: [facility];  Service: General;   Laterality: N/A;  . ANTERIOR FUSION CERVICAL SPINE     x 2  . BACK SURGERY     lower back with screws and rods  . BOWEL RESECTION N/A 10/17/2015   Procedure: EXPLORATORY LAPAROTOMY, SMALL BOWEL RESECTION, WITH ANASTAMOSIS;  Surgeon: Ralene Ok, MD;  Location: [facility];  Service: General;  Laterality: N/A;  . CATARACT EXTRACTION, BILATERAL Bilateral   . EYE SURGERY Left    corneal tranplant  . LAPAROSCOPY N/A 10/17/2015   Procedure: LAPAROSCOPY DIAGNOSTIC;  Surgeon: Ralene Ok, MD;  Location: [facility];  Service: General;  Laterality: N/A;  . PORT-A-CATH REMOVAL Left 05/02/2016   Procedure: REMOVAL PORT-A-CATH;  Surgeon: Ralene Ok, MD;  Location: [facility];  Service: General;  Laterality: Left;  . PORTACATH PLACEMENT N/A 11/08/2015   Procedure: INSERTION PORT-A-CATH;  Surgeon: Ralene Ok, MD;  Location: [facility];  Service: General;  Laterality: N/A;    Home Medications:    Allergies:  Allergies  Allergen Reactions  . Penicillin G Hives    Childhood allergy Has patient had a PCN reaction causing immediate rash, facial/tongue/throat swelling, SOB or lightheadedness with hypotension: n/a Has patient had a PCN reaction causing severe rash involving mucus membranes or skin necrosis: n/a Has patient had a PCN reaction that required hospitalization: n/a Has patient had a PCN reaction occurring within the last 10 years:  n/a If all of the above answers  are "NO", then may proceed with Cephalosporin use.    Family History  Problem Relation Age of Onset  . Breast cancer Sister     Social History:  reports that he has never smoked. He has never used smokeless tobacco. He reports that he does not drink alcohol or use drugs.  ROS: A complete review of systems was performed.  All systems are negative except for pertinent findings as noted.  Physical Exam:  Vital signs in last 24 hours: Temp:  [98.8 F (37.1 C)-101 F (38.3 C)] 98.8 F (37.1 C) (05/23 1653) Pulse Rate:  [71-89] 72  (05/23 1730) Resp:  [12-20] 13 (05/23 1730) BP: (100-119)/(56-80) 100/59 (05/23 1730) SpO2:  [95 %-97 %] 96 % (05/23 1730) Weight:  [90.7 kg] 90.7 kg (05/23 1352) Constitutional:  Alert and oriented, No acute distress Cardiovascular: Regular rate  Respiratory: Normal respiratory effort GI: Abdomen is soft, nontender, nondistended, no abdominal masses. No CVAT.  Lymphatic: No lymphadenopathy Neurologic: Grossly intact, no focal deficits Psychiatric: Normal mood and affect  Laboratory Data:  Recent Labs    08/01/18 1354  WBC 5.5  HGB 11.2*  HCT 33.6*  PLT 120*    Recent Labs    08/01/18 1354  NA 132*  K 4.7  CL 100  GLUCOSE 125*  BUN 25*  CALCIUM 8.5*  CREATININE 2.06*     Results for orders placed or performed during the hospital encounter of 08/01/18 (from the past 24 hour(s))  Comprehensive metabolic panel     Status: Abnormal   Collection Time: 08/01/18  1:54 PM  Result Value Ref Range   Sodium 132 (L) 135 - 145 mmol/L   Potassium 4.7 3.5 - 5.1 mmol/L   Chloride 100 98 - 111 mmol/L   CO2 24 22 - 32 mmol/L   Glucose, Bld 125 (H) 70 - 99 mg/dL   BUN 25 (H) 8 - 23 mg/dL   Creatinine, Ser 2.06 (H) 0.61 - 1.24 mg/dL   Calcium 8.5 (L) 8.9 - 10.3 mg/dL   Total Protein 6.9 6.5 - 8.1 g/dL   Albumin 3.6 3.5 - 5.0 g/dL   AST 33 15 - 41 U/L   ALT 27 0 - 44 U/L   Alkaline Phosphatase 58 38 - 126 U/L   Total Bilirubin 0.8 0.3 - 1.2 mg/dL   GFR calc non Af Amer 33 (L) >60 mL/min   GFR calc Af Amer 39 (L) >60 mL/min   Anion gap 8 5 - 15  CBC with Differential     Status: Abnormal   Collection Time: 08/01/18  1:54 PM  Result Value Ref Range   WBC 5.5 4.0 - 10.5 K/uL   RBC 3.80 (L) 4.22 - 5.81 MIL/uL   Hemoglobin 11.2 (L) 13.0 - 17.0 g/dL   HCT 33.6 (L) 39.0 - 52.0 %   MCV 88.4 80.0 - 100.0 fL   MCH 29.5 26.0 - 34.0 pg   MCHC 33.3 30.0 - 36.0 g/dL   RDW 14.0 11.5 - 15.5 %   Platelets 120 (L) 150 - 400 K/uL   nRBC 0.0 0.0 - 0.2 %   Neutrophils Relative % 87 %    Neutro Abs 4.8 1.7 - 7.7 K/uL   Lymphocytes Relative 4 %   Lymphs Abs 0.2 (L) 0.7 - 4.0 K/uL   Monocytes Relative 8 %   Monocytes Absolute 0.5 0.1 - 1.0 K/uL   Eosinophils Relative 1 %   Eosinophils Absolute 0.0 0.0 - 0.5 K/uL   Basophils Relative  0 %   Basophils Absolute 0.0 0.0 - 0.1 K/uL   Immature Granulocytes 0 %   Abs Immature Granulocytes 0.02 0.00 - 0.07 K/uL  Lactic acid, plasma     Status: None   Collection Time: 08/01/18  2:13 PM  Result Value Ref Range   Lactic Acid, Venous DUPLICATE 0.5 - 1.9 mmol/L  Urinalysis, Routine w reflex microscopic     Status: Abnormal   Collection Time: 08/01/18  2:13 PM  Result Value Ref Range   Color, Urine YELLOW YELLOW   APPearance CLEAR CLEAR   Specific Gravity, Urine 1.015 1.005 - 1.030   pH 5.5 5.0 - 8.0   Glucose, UA NEGATIVE NEGATIVE mg/dL   Hgb urine dipstick LARGE (A) NEGATIVE   Bilirubin Urine NEGATIVE NEGATIVE   Ketones, ur NEGATIVE NEGATIVE mg/dL   Protein, ur NEGATIVE NEGATIVE mg/dL   Nitrite NEGATIVE NEGATIVE   Leukocytes,Ua NEGATIVE NEGATIVE  SARS Coronavirus 2 (Hosp order,Performed in [place] lab via [facility] ID)     Status: None   Collection Time: 08/01/18  2:13 PM  Result Value Ref Range   SARS Coronavirus 2 ([facility] ID Now) NEGATIVE NEGATIVE  Urinalysis, Microscopic (reflex)     Status: Abnormal   Collection Time: 08/01/18  2:13 PM  Result Value Ref Range   RBC / HPF 21-50 0 - 5 RBC/hpf   WBC, UA 0-5 0 - 5 WBC/hpf   Bacteria, UA MANY (A) NONE SEEN   Squamous Epithelial / LPF 0-5 0 - 5   Mucus PRESENT    Hyaline Casts, UA PRESENT   Lactic acid, plasma     Status: None   Collection Time: 08/01/18  3:44 PM  Result Value Ref Range   Lactic Acid, Venous 1.2 0.5 - 1.9 mmol/L  Lactic acid, plasma     Status: None   Collection Time: 08/01/18  3:54 PM  Result Value Ref Range   Lactic Acid, Venous 1.4 0.5 - 1.9 mmol/L   Recent Results (from the past 240 hour(s))  SARS Coronavirus 2 (Hosp order,Performed in [place] lab via [employer] ID)     Status: None   Collection Time: 08/01/18  2:13 PM  Result Value Ref Range Status   SARS Coronavirus 2 ([facility] ID Now) NEGATIVE NEGATIVE Final    Comment: (NOTE) Interpretive Result Comment(s): COVID 19 Positive SARS CoV 2 target nucleic acids are DETECTED. The SARS CoV 2 RNA is generally detectable in upper and lower respiratory specimens during the acute phase of infection.  Positive results are indicative of active infection with SARS CoV 2.  Clinical correlation with patient history and other diagnostic information is necessary to determine patient infection status.  Positive results do not rule out bacterial infection or coinfection with other viruses. The expected result is Negative. COVID 19 Negative SARS CoV 2 target nucleic acids are NOT DETECTED. The SARS CoV 2 RNA is generally detectable in upper and lower respiratory specimens during the acute phase of infection.  Negative results do not preclude SARS CoV 2 infection, do not rule out coinfections with other pathogens, and should not be used as the sole basis for treatment or other patient management decisions.  Negative results must be combined with clinical  observations, patient history, and epidemiological information. The expected result is Negative. Invalid Presence or absence of SARS CoV 2 nucleic acids cannot be determined. Repeat testing was performed on the submitted specimen and repeated Invalid results were obtained.  If clinically indicated, additional testing on a new  specimen with an alternate test methodology [id number]) is advised.  The SARS CoV 2 RNA is generally detectable in upper and lower respiratory specimens during the acute phase of infection. The expected result is Negative. Fact Sheet for Patients:  G[employer]ingFamily.no Fact Sheet for Healthcare Providers: https://www.hernandez-brewer.com/ This test is not yet approved or cleared by  the [place] FDA and has been authorized for detection and/or diagnosis of SARS CoV 2 by FDA under an Emergency Use Authorization (EUA).  This EUA will remain in effect (meaning this test can be used) for the duration of the COVID19 d eclaration under Section 564(b)(1) of the Act, 21 U.S.C. section 9173557491 3(b)(1), unless the authorization is terminated or revoked sooner. Performed at [facility], [address], [place], [place] [address]     Renal Function: Recent Labs    08/01/18 1354  CREATININE 2.06*   Estimated Creatinine Clearance: 42.3 mL/min (A) (by C-G formula based on SCr of 2.06 mg/dL (H)).  Radiologic Imaging: Dg Chest Port 1 View  Result Date: 08/01/2018 CLINICAL DATA:  Feet for with syncope EXAM: PORTABLE CHEST 1 VIEW COMPARISON:  Chest CT May 05, 2018 FINDINGS: The lungs are clear. Heart size and pulmonary vascularity are normal. No adenopathy. There is postoperative change in the lower cervical region. IMPRESSION: No edema or consolidation. Electronically Signed   By: Lowella Grip III M.D.   On: 08/01/2018 14:23   Ct Renal Stone Study  Result Date: 08/01/2018 CLINICAL DATA:  Fever and flank pain EXAM: CT ABDOMEN AND PELVIS WITHOUT CONTRAST TECHNIQUE: Multidetector CT imaging of the abdomen and pelvis was performed following the standard protocol without oral or IV contrast. COMPARISON:  May 05, 2018. FINDINGS: Lower chest: Lung bases are clear.  There is a small hiatal hernia. Hepatobiliary: A previously document 4 cm area apparent focal nodular hyperplasia in the posterior segment of the right lobe of the liver is not well seen on this noncontrast enhanced image. No liver lesions are appreciable on this noncontrast enhanced study. The gallbladder wall is not appreciably thickened. There is no biliary duct dilatation. Pancreas: There is no appreciable pancreatic mass or inflammatory focus. Spleen: No splenic lesions are evident.  Adrenals/Urinary Tract: Adrenals bilaterally appear unremarkable. Left kidney appears edematous with perinephric stranding on the left. There is no appreciable renal mass on either side. There is mild hydronephrosis on the left. There is no appreciable hydronephrosis on the right. There is no appreciable intrarenal calculus on either side. There is a 1 mm calculus in the distal left ureter at the mid acetabular level. Note that there is soft tissue stranding along the course of the left ureter to the level of this distal calculus. No other ureteral calculi are evident on this study. Urinary bladder is midline. Urinary bladder wall appears mildly thickened. Stomach/Bowel: There is postoperative change noted in the region of the cecum with anastomosis patent. There is moderate stool throughout the colon. There is no appreciable bowel wall or mesenteric thickening. There is no evident bowel obstruction. Terminal ileum appears unremarkable. There is no appreciable free air or portal venous air. Vascular/Lymphatic: There is aortic atherosclerosis. There is no aneurysm evident. There is no adenopathy in the abdomen or pelvis. Reproductive: Prostate is mildly prominent, stable, and contains multiple calcifications. Seminal vesicles appear unremarkable. No evident pelvic mass. Other: Suspect appendiceal absence. No periappendiceal region inflammation. There is no evident abscess or ascites in the abdomen or pelvis. Mild rectus diastasis in the midline in the mid pelvis  is again noted. No bowel compromise in this area. There is fat in each inguinal ring. Musculoskeletal: . known IMPRESSION: 1. 1 mm calculus in the distal left ureter with mild hydronephrosis and ureterectasis on the left. There is left renal edema and perinephric stranding. No other ureteral calculi evident. 2. There is a degree of urinary bladder wall thickening, felt to represent a degree of cystitis. 3. Postoperative change in the ascending colon with  anastomosis patent. No evident bowel obstruction. No abscess in the abdomen pelvis. No periappendiceal region inflammatory change. 4. Multiple prostatic calculi evident. Prostate borderline prominent. 5.  Aortic atherosclerosis. 6. Extensive arthropathy at L5-S1 with spondylolisthesis, felt to be due to pars defects at L5. Postoperative change noted in this area. 7.  Small hiatal hernia. 8. Known focus of focal nodular hyperplasia in the right lobe of the liver, not well seen on this noncontrast enhanced study. Electronically Signed   By: Lowella Grip III M.D.   On: 08/01/2018 15:29    Impression/Assessment:  Probable infected left distal ureteral stone  Plan:  Cystoscopy, left retrograde ureteropyelogram, left double-J stent placement

## 2018-08-01 NOTE — ED Triage Notes (Signed)
Pt reports he passed a kidney stone yesterday. Pt started having a fever and lightheadedness today.

## 2018-08-01 NOTE — Discharge Instructions (Signed)

## 2018-08-01 NOTE — Anesthesia Procedure Notes (Signed)
Date/Time: 08/01/2018 7:24 PM Performed by: Cynda Familia, CRNA Oxygen Delivery Method: Simple face mask Placement Confirmation: positive ETCO2 and breath sounds checked- equal and bilateral Dental Injury: Teeth and Oropharynx as per pre-operative assessment

## 2018-08-01 NOTE — ED Provider Notes (Signed)
4:55 PM Patient arrives from [facility]. He is well appearing. Awaiting urology.  Urology will take to the [place], [place], MD 08/01/18 1750

## 2018-08-01 NOTE — ED Notes (Signed)
Registration alerted staff to this pt fell while coming into the ED. On assessment, pt is diaphoretic and states he became extremely dizzy while entering the department and is unsure what happened. Pt states he may have passed out. Pt was in the process getting up when we entered the lobby. Pt denies injury.;

## 2018-08-01 NOTE — Anesthesia Procedure Notes (Signed)
Procedure Name: LMA Insertion Date/Time: 08/01/2018 7:05 PM Performed by: Cynda Familia, CRNA Pre-anesthesia Checklist: Patient identified, Emergency Drugs available, Suction available and Patient being monitored Patient Re-evaluated:Patient Re-evaluated prior to induction Oxygen Delivery Method: Circle System Utilized Preoxygenation: Pre-oxygenation with 100% oxygen Induction Type: IV induction Ventilation: Mask ventilation without difficulty LMA: LMA inserted and LMA with gastric port inserted LMA Size: 4.0 Number of attempts: 1 Placement Confirmation: positive ETCO2 Tube secured with: Tape Dental Injury: Teeth and Oropharynx as per pre-operative assessment  Comments: Smooth IV induction [place]-- LMA insertion by CRNA-- removed and reinserted by [facility] as it was leaking-- bilat BS

## 2018-08-01 NOTE — Anesthesia Preprocedure Evaluation (Addendum)
Anesthesia Evaluation  Patient identified by MRN, date of birth, ID band Patient awake    Reviewed: Allergy & Precautions, NPO status , Patient's Chart, lab work & pertinent test results  Airway Mallampati: II  TM Distance: >3 FB Neck ROM: Full    Dental  (+) Teeth Intact, Dental Advisory Given   Pulmonary    breath sounds clear to auscultation       Cardiovascular  Rhythm:Regular Rate:Normal     Neuro/Psych    GI/Hepatic   Endo/Other    Renal/GU      Musculoskeletal   Abdominal   Peds  Hematology   Anesthesia Other Findings   Reproductive/Obstetrics                            Anesthesia Physical Anesthesia Plan  ASA: III  Anesthesia Plan: General   Post-op Pain Management:    Induction: Intravenous  PONV Risk Score and Plan: Ondansetron and Dexamethasone  Airway Management Planned: LMA  Additional Equipment:   Intra-op Plan:   Post-operative Plan:   Informed Consent: I have reviewed the patients History and Physical, chart, labs and discussed the procedure including the risks, benefits and alternatives for the proposed anesthesia with the patient or authorized representative who has indicated his/her understanding and acceptance.       Plan Discussed with: CRNA and Anesthesiologist  Anesthesia Plan Comments:         Anesthesia Quick Evaluation

## 2018-08-01 NOTE — Transfer of Care (Signed)
Immediate Anesthesia Transfer of Care Note  Patient: [name]  Procedure(s) Performed: CYSTOSCOPY WITH RETROGRADE PYELOGRAM/URETERAL STENT PLACEMENT (Left )  Patient Location: PACU  Anesthesia Type:General  Level of Consciousness: awake and alert   Airway & Oxygen Therapy: Patient Spontanous Breathing and Patient connected to face mask oxygen  Post-op Assessment: Report given to RN and Post -op Vital signs reviewed and stable  Post vital signs: Reviewed and stable  Last Vitals:  Vitals Value Taken Time  BP 121/60 08/01/2018  7:31 PM  Temp    Pulse 91 08/01/2018  7:33 PM  Resp 17 08/01/2018  7:33 PM  SpO2 99 % 08/01/2018  7:33 PM  Vitals shown include unvalidated device data.  Last Pain:  Vitals:   08/01/18 1653  TempSrc: Oral  PainSc:          Complications: No apparent anesthesia complications

## 2018-08-01 NOTE — ED Provider Notes (Addendum)
[facility] EMERGENCY DEPARTMENT Provider Note   CSN: [id number] Arrival date & time: 08/01/18  1345    History   Chief Complaint Chief Complaint  Patient presents with   Fever    HPI Tylek Boney is a 63 y.o. male.     The history is provided by the patient and medical records. No language interpreter was used.  Fever   Nikolai Wilczak is a 63 y.o. male who presents to the Emergency Department complaining of fever. He began feeling poorly two days ago with flank pain described as an aching sensation. He thought he had recurrent kidney stone. He was seen in urgent care and had blood in his urine and was treating himself symptomatically. He has a history of multiple kidney stones in the past. Today around 11 he began feeling significantly worse with generalized body aches, fever to 102, nausea and malaise. He also complains of generalized weakness. His wife brought him to the emergency department. When checking in he began to feel very dizzy and lightheaded and fell to the ground. He did not injure himself in the fall and is unsure if he passed out. He denies any dysuria, diarrhea. No known COVID exposures. His wife works as a Marine scientist.  Sxs are severe, constant, worsening.   Past Medical History:  Diagnosis Date   Anemia    oral iron supplement used   Anxiety    Cancer ([place])    skin cancer - face basal and squamous   Chronic back pain    lower back   Constipation    Depression    GERD (gastroesophageal reflux disease)    Head injury, closed, with concussion    History of kidney stones    x 4 episodes-no recent issues   Keratoconus    left eye   Lymphoma ([place])    chemo  4 and 1/2 months last chemo Mar 01, 2016   Personal history of chemotherapy 2017   lymphoma    Pneumonia    age 76   Recurrent sinus infections    past history-none recent " Inhalers were for this- no use im many yrs"    Patient Active Problem List   Diagnosis Date Noted    Obstructive sleep apnea 02/09/2018   Acute bronchitis, unspecified 02/09/2018   Breast lump 10/18/2016   Chemotherapy-induced peripheral neuropathy ([place]) 02/09/2016   MGUS (monoclonal gammopathy of unknown significance) 12/08/2015   Diffuse large B-cell lymphoma of extranodal site ([place]) 10/25/2015   S/P small bowel resection 10/17/2015    Past Surgical History:  Procedure Laterality Date   ABDOMINAL WALL DEFECT REPAIR N/A 11/08/2015   Procedure: EXPLORE LOWER  ABDOMINAL WALL WITH BIOPSY;  Surgeon: Ralene Ok, MD;  Location: [facility];  Service: General;  Laterality: N/A;   ANTERIOR FUSION CERVICAL SPINE     x 2   BACK SURGERY     lower back with screws and rods   BOWEL RESECTION N/A 10/17/2015   Procedure: EXPLORATORY LAPAROTOMY, SMALL BOWEL RESECTION, WITH ANASTAMOSIS;  Surgeon: Ralene Ok, MD;  Location: [facility];  Service: General;  Laterality: N/A;   CATARACT EXTRACTION, BILATERAL Bilateral    EYE SURGERY Left    corneal tranplant   LAPAROSCOPY N/A 10/17/2015   Procedure: LAPAROSCOPY DIAGNOSTIC;  Surgeon: Ralene Ok, MD;  Location: [facility];  Service: General;  Laterality: N/A;   PORT-A-CATH REMOVAL Left 05/02/2016   Procedure: REMOVAL PORT-A-CATH;  Surgeon: Ralene Ok, MD;  Location: [facility];  Service: General;  Laterality: Left;  PORTACATH PLACEMENT N/A 11/08/2015   Procedure: INSERTION PORT-A-CATH;  Surgeon: Ralene Ok, MD;  Location: [facility];  Service: General;  Laterality: N/A;        Home Medications    Prior to Admission medications   Medication Sig Start Date End Date Taking? Authorizing Provider  acetaminophen (TYLENOL) 325 MG tablet Take 650 mg by mouth every 6 (six) hours as needed for mild pain.    [provider]  albuterol (PROVENTIL HFA;VENTOLIN HFA) 108 (90 Base) MCG/ACT inhaler Inhale 2 puffs into the lungs every 6 (six) hours as needed for wheezing or shortness of breath.     [provider]  azithromycin (ZITHROMAX  Z-PAK) 250 MG tablet Take 2 tabs today, then 1 daily until gone. 02/09/18   Deneise Lever, MD  clonazePAM (KLONOPIN) 1 MG tablet Take 1 mg by mouth 2 (two) times daily. 09/18/15   [provider]  DEXILANT 60 MG capsule Take 60 mg by mouth daily. 07/21/15   [provider]  FLUoxetine (PROZAC) 20 MG tablet Take 40 mg by mouth 2 (two) times daily. 07/28/15   [provider]  gabapentin (NEURONTIN) 300 MG capsule Take 1 capsule (300 mg total) by mouth at bedtime as needed (nerve pain). 04/21/17   Nicholas Lose, MD  meloxicam (MOBIC) 7.5 MG tablet Take 1 tablet (7.5 mg total) by mouth daily. 04/21/17   Nicholas Lose, MD  Multiple Vitamin (MULTIVITAMIN WITH MINERALS) TABS tablet Take 1 tablet by mouth daily.    [provider]  rosuvastatin (CRESTOR) 5 MG tablet Take 5 mg by mouth every evening.  08/28/15   [provider]    Family History Family History  Problem Relation Age of Onset   Breast cancer Sister     Social History Social History   Tobacco Use   Smoking status: Never Smoker   Smokeless tobacco: Never Used  Substance Use Topics   Alcohol use: No   Drug use: No     Allergies   Penicillin g   Review of Systems Review of Systems  Constitutional: Positive for fever.  All other systems reviewed and are negative.    Physical Exam Updated Vital Signs BP 119/80 (BP Location: Right Arm)    Pulse 89    Temp (!) 101 F (38.3 C) (Oral)    Resp 20    Ht 5\' 11"  (1.803 m)    Wt 90.7 kg    SpO2 95%    BMI 27.89 kg/m   Physical Exam Vitals signs and nursing note reviewed.  Constitutional:      Appearance: He is well-developed.  HENT:     Head: Normocephalic and atraumatic.  Cardiovascular:     Rate and Rhythm: Regular rhythm.     Comments: tachycardic Pulmonary:     Effort: Pulmonary effort is normal. No respiratory distress.  Abdominal:     Palpations: Abdomen is soft.     Tenderness: There is no abdominal tenderness.  There is no right CVA tenderness, left CVA tenderness, guarding or rebound.  Musculoskeletal:        General: No swelling or tenderness.  Skin:    General: Skin is warm and dry.  Neurological:     Mental Status: He is alert and oriented to person, place, and time.  Psychiatric:        Behavior: Behavior normal.      ED Treatments / Results  Labs (all labs ordered are listed, but only abnormal results are displayed) Labs Reviewed  COMPREHENSIVE METABOLIC PANEL - Abnormal; Notable for the following components:      Result Value   Sodium 132 (*)    Glucose, Bld 125 (*)    BUN 25 (*)    Creatinine, Ser 2.06 (*)    Calcium 8.5 (*)    GFR calc non Af Amer 33 (*)    GFR calc Af Amer 39 (*)    All other components within normal limits  CBC WITH DIFFERENTIAL/PLATELET - Abnormal; Notable for the following components:   RBC 3.80 (*)    Hemoglobin 11.2 (*)    HCT 33.6 (*)    Platelets 120 (*)    Lymphs Abs 0.2 (*)    All other components within normal limits  URINALYSIS, ROUTINE W REFLEX MICROSCOPIC - Abnormal; Notable for the following components:   Hgb urine dipstick LARGE (*)    All other components within normal limits  URINALYSIS, MICROSCOPIC (REFLEX) - Abnormal; Notable for the following components:   Bacteria, UA MANY (*)    All other components within normal limits  SARS CORONAVIRUS 2 (HOSP ORDER, PERFORMED IN [facility] VIA [facility]BBOTT ID)  CULTURE, BLOOD (ROUTINE X 2)  CULTURE, BLOOD (ROUTINE X 2)  URINE CULTURE  LACTIC ACID, PLASMA  LACTIC ACID, PLASMA  LACTIC ACID, PLASMA    EKG EKG Interpretation  Date/Time:  Saturday Aug 01 2018 13:51:36 EDT Ventricular Rate:  94 PR Interval:    QRS Duration: 93 QT Interval:  338 QTC Calculation: 423 R Axis:   83 Text Interpretation:  Sinus rhythm Borderline right axis deviation Confirmed by Quintella Reichert [phone]) on 08/01/2018 2:44:10 PM   Radiology Dg Chest Port 1 View  Result Date: 08/01/2018 CLINICAL DATA:  Feet  for with syncope EXAM: PORTABLE CHEST 1 VIEW COMPARISON:  Chest CT May 05, 2018 FINDINGS: The lungs are clear. Heart size and pulmonary vascularity are normal. No adenopathy. There is postoperative change in the lower cervical region. IMPRESSION: No edema or consolidation. Electronically Signed   By: Lowella Grip III M.D.   On: 08/01/2018 14:23   Ct Renal Stone Study  Result Date: 08/01/2018 CLINICAL DATA:  Fever and flank pain EXAM: CT ABDOMEN AND PELVIS WITHOUT CONTRAST TECHNIQUE: Multidetector CT imaging of the abdomen and pelvis was performed following the standard protocol without oral or IV contrast. COMPARISON:  May 05, 2018. FINDINGS: Lower chest: Lung bases are clear.  There is a small hiatal hernia. Hepatobiliary: A previously document 4 cm area apparent focal nodular hyperplasia in the posterior segment of the right lobe of the liver is not well seen on this noncontrast enhanced image. No liver lesions are appreciable on this noncontrast enhanced study. The gallbladder wall is not appreciably thickened. There is no biliary duct dilatation. Pancreas: There is no appreciable pancreatic mass or inflammatory focus. Spleen: No splenic lesions are evident. Adrenals/Urinary Tract: Adrenals bilaterally appear unremarkable. Left kidney appears edematous with perinephric stranding on the left. There is no appreciable renal mass on either side. There is mild hydronephrosis on the left. There is no appreciable hydronephrosis on the right. There is no appreciable intrarenal calculus on either side. There is a 1 mm calculus in the distal left ureter at the mid acetabular level. Note that there is soft tissue stranding along the course of the left ureter to the level of this distal calculus. No other ureteral calculi are evident on this study. Urinary bladder is midline. Urinary bladder wall appears mildly thickened. Stomach/Bowel: There is postoperative change noted in the  region of the cecum with  anastomosis patent. There is moderate stool throughout the colon. There is no appreciable bowel wall or mesenteric thickening. There is no evident bowel obstruction. Terminal ileum appears unremarkable. There is no appreciable free air or portal venous air. Vascular/Lymphatic: There is aortic atherosclerosis. There is no aneurysm evident. There is no adenopathy in the abdomen or pelvis. Reproductive: Prostate is mildly prominent, stable, and contains multiple calcifications. Seminal vesicles appear unremarkable. No evident pelvic mass. Other: Suspect appendiceal absence. No periappendiceal region inflammation. There is no evident abscess or ascites in the abdomen or pelvis. Mild rectus diastasis in the midline in the mid pelvis is again noted. No bowel compromise in this area. There is fat in each inguinal ring. Musculoskeletal: . known IMPRESSION: 1. 1 mm calculus in the distal left ureter with mild hydronephrosis and ureterectasis on the left. There is left renal edema and perinephric stranding. No other ureteral calculi evident. 2. There is a degree of urinary bladder wall thickening, felt to represent a degree of cystitis. 3. Postoperative change in the ascending colon with anastomosis patent. No evident bowel obstruction. No abscess in the abdomen pelvis. No periappendiceal region inflammatory change. 4. Multiple prostatic calculi evident. Prostate borderline prominent. 5.  Aortic atherosclerosis. 6. Extensive arthropathy at L5-S1 with spondylolisthesis, felt to be due to pars defects at L5. Postoperative change noted in this area. 7.  Small hiatal hernia. 8. Known focus of focal nodular hyperplasia in the right lobe of the liver, not well seen on this noncontrast enhanced study. Electronically Signed   By: Lowella Grip III M.D.   On: 08/01/2018 15:29    Procedures Procedures (including critical care time) CRITICAL CARE Performed by: Quintella Reichert   Total critical care time: 35  minutes  Critical care time was exclusive of separately billable procedures and treating other patients.  Critical care was necessary to treat or prevent imminent or life-threatening deterioration.  Critical care was time spent personally by me on the following activities: development of treatment plan with patient and/or surrogate as well as nursing, discussions with consultants, evaluation of patient's response to treatment, examination of patient, obtaining history from patient or surrogate, ordering and performing treatments and interventions, ordering and review of laboratory studies, ordering and review of radiographic studies, pulse oximetry and re-evaluation of patient's condition.  Medications Ordered in ED Medications  cefTRIAXone (ROCEPHIN) 1 g in sodium chloride 0.9 % 100 mL IVPB (1 g Intravenous New Bag/Given 08/01/18 1447)  acetaminophen (TYLENOL) tablet 650 mg (650 mg Oral Given 08/01/18 1400)  sodium chloride 0.9 % bolus 1,000 mL (1,000 mLs Intravenous New Bag/Given 08/01/18 1433)     Initial Impression / Assessment and Plan / ED Course  I have reviewed the triage vital signs and the nursing notes.  Pertinent labs & imaging results that were available during my care of the patient were reviewed by me and considered in my medical decision making (see chart for details).        Patient here for evaluation of two days of left flank pain, syncopal event on ED registration. He is febrile on ED arrival, concern for urinary tract infection and was treated with IV fluids, IV antibiotics. Labs are significant for AKI. Urinalysis is concerning for UTI. CT scan demonstrates a 1 mm left ureteral stone. Discussed the patient with Dr. Diona Fanti with urology, who recommends transfer so he can evaluate the patient for possible stenting. Discussed with Dr. Regenia Skeeter in the was along the emergency department who accepts  the patient in transfer. Patient updated of findings of studies and  recommendation for transfer and admission and he is in agreement with treatment plan.  Final Clinical Impressions(s) / ED Diagnoses   Final diagnoses:  Ureteral stone  Acute UTI    ED Discharge Orders    None       Quintella Reichert, MD 08/01/18 1517    Quintella Reichert, MD 08/01/18 [phone]

## 2018-08-02 NOTE — Progress Notes (Signed)
One temp 101 C/S pending ? D/c home in am Lives local

## 2018-08-02 NOTE — Anesthesia Postprocedure Evaluation (Signed)
Anesthesia Post Note  Patient: [name]  Procedure(s) Performed: CYSTOSCOPY WITH RETROGRADE PYELOGRAM/URETERAL STENT PLACEMENT (Left )     Patient location during evaluation: PACU Anesthesia Type: General Level of consciousness: awake and alert Pain management: pain level controlled Vital Signs Assessment: post-procedure vital signs reviewed and stable Respiratory status: spontaneous breathing, nonlabored ventilation, respiratory function stable and patient connected to nasal cannula oxygen Cardiovascular status: blood pressure returned to baseline and stable Postop Assessment: no apparent nausea or vomiting Anesthetic complications: no    Last Vitals:  Vitals:   08/01/18 2240 08/02/18 0503  BP:  118/60  Pulse:  (!) 58  Resp:  18  Temp: 36.8 C 36.6 C  SpO2:  96%    Last Pain:  Vitals:   08/02/18 0503  TempSrc: Oral  PainSc:                  Marlie Kuennen COKER

## 2018-08-03 ENCOUNTER — Encounter (HOSPITAL_COMMUNITY): Payer: Self-pay | Admitting: Urology

## 2018-08-03 LAB — URINE CULTURE: Culture: NO GROWTH

## 2018-08-03 LAB — HIV ANTIBODY (ROUTINE TESTING W REFLEX): HIV Screen 4th Generation wRfx: NONREACTIVE

## 2018-08-03 MED ORDER — HYDROCODONE-ACETAMINOPHEN 5-325 MG PO TABS: 1.0000 | ORAL_TABLET | ORAL | 0 refills | Status: DC | PRN

## 2018-08-03 MED ORDER — SULFAMETHOXAZOLE-TRIMETHOPRIM 800-160 MG PO TABS: 1.0000 | ORAL_TABLET | Freq: Two times a day (BID) | ORAL | 0 refills | Status: DC

## 2018-08-06 LAB — CULTURE, BLOOD (ROUTINE X 2)
Culture: NO GROWTH
Culture: NO GROWTH
Special Requests: ADEQUATE
Special Requests: ADEQUATE

## 2018-08-11 DIAGNOSIS — N201 Calculus of ureter: Secondary | ICD-10-CM | POA: Diagnosis not present

## 2018-08-12 NOTE — Discharge Summary (Signed)
Physician Discharge Summary  Patient ID: Edward Steele MRN: [id number] DOB/AGE: 1955/11/04 63 y.o.  Admit date: 08/01/2018 Discharge date: 08/03/2018 Admission Diagnoses: Ureteral calculus, sepsis Discharge Diagnoses:  Active Problems:   Calculus of ureter   Discharged Condition: good  Hospital Course: The patient tolerated the procedure well and was transferred to the floor on IV pain meds, IV fluid. On POD#1 foley was removed, pt was started on regular diet and they ambulated in the halls. On POD#2  IVFs were discontinued, and the patient passed flatus. Prior to discharge the pt was tolerating a regular diet, pain was controlled on PO pain meds, they were ambulating without difficulty, and they had normal bowel function.  Consults: None  Significant Diagnostic Studies: none  Treatments: surgery: Cystoscopy with ureteral stent placement  Discharge Exam: Blood pressure 131/73, pulse (!) 59, temperature 97.8 F (36.6 C), temperature source Oral, resp. rate 20, height 5\' 10"  (1.778 m), weight 96.6 kg, SpO2 97 %. General appearance: alert, cooperative and appears stated age Head: Normocephalic, without obvious abnormality, atraumatic Eyes: conjunctivae/corneas clear. PERRL, EOM's intact. Fundi benign. Back: symmetric, no curvature. ROM normal. No CVA tenderness. Resp: clear to auscultation bilaterally Cardio: regular rate and rhythm, S1, S2 normal, no murmur, click, rub or gallop GI: soft, non-tender; bowel sounds normal; no masses,  no organomegaly Extremities: extremities normal, atraumatic, no cyanosis or edema  Disposition: Discharge disposition: 01-Home or Self Care       Discharge Instructions    Discharge patient   Complete by:  As directed    Discharge disposition:  01-Home or Self Care   Discharge patient date:  08/03/2018     Allergies as of 08/03/2018      Reactions   Penicillin G Hives   Childhood allergy Has patient had a PCN reaction causing immediate rash,  facial/tongue/throat swelling, SOB or lightheadedness with hypotension: n/a Has patient had a PCN reaction causing severe rash involving mucus membranes or skin necrosis: n/a Has patient had a PCN reaction that required hospitalization: n/a Has patient had a PCN reaction occurring within the last 10 years:  n/a If all of the above answers are "NO", then may proceed with Cephalosporin use.      Medication List    STOP taking these medications   azithromycin 250 MG tablet Commonly known as:  Zithromax Z-Pak     TAKE these medications   acetaminophen 325 MG tablet Commonly known as:  TYLENOL Take 650 mg by mouth every 6 (six) hours as needed for mild pain.   albuterol 108 (90 Base) MCG/ACT inhaler Commonly known as:  VENTOLIN HFA Inhale 2 puffs into the lungs every 6 (six) hours as needed for wheezing or shortness of breath.   clonazePAM 1 MG tablet Commonly known as:  KLONOPIN Take 1 mg by mouth 2 (two) times daily.   Dexilant 60 MG capsule Generic drug:  dexlansoprazole Take 60 mg by mouth daily after breakfast.   FLUoxetine 20 MG tablet Commonly known as:  PROZAC Take 40 mg by mouth 2 (two) times daily.   gabapentin 300 MG capsule Commonly known as:  NEURONTIN Take 1 capsule (300 mg total) by mouth at bedtime as needed (nerve pain).   HYDROcodone-acetaminophen 5-325 MG tablet Commonly known as:  NORCO/VICODIN Take 1-2 tablets by mouth every 4 (four) hours as needed for moderate pain.   meloxicam 7.5 MG tablet Commonly known as:  Mobic Take 1 tablet (7.5 mg total) by mouth daily. What changed:  when to take this  rosuvastatin 5 MG tablet Commonly known as:  CRESTOR Take 5 mg by mouth every evening.   sulfamethoxazole-trimethoprim 800-160 MG tablet Commonly known as:  BACTRIM DS Take 1 tablet by mouth 2 (two) times daily.      Follow-up Information    Franchot Gallo, MD.   Specialty:  Urology Why:  Call our office Tuesday to set up an appointment within  the next 10 days for possible cystoscopy and stent removal Contact information: [place]  [id number] [phone]           Signed: Nicolette Bang 08/12/2018, 9:48 PM

## 2018-08-13 ENCOUNTER — Ambulatory Visit: Payer: BLUE CROSS/BLUE SHIELD | Admitting: Internal Medicine

## 2018-09-01 DIAGNOSIS — D225 Melanocytic nevi of trunk: Secondary | ICD-10-CM | POA: Diagnosis not present

## 2018-09-01 DIAGNOSIS — D2262 Melanocytic nevi of left upper limb, including shoulder: Secondary | ICD-10-CM | POA: Diagnosis not present

## 2018-09-01 DIAGNOSIS — L570 Actinic keratosis: Secondary | ICD-10-CM | POA: Diagnosis not present

## 2018-09-01 DIAGNOSIS — D2261 Melanocytic nevi of right upper limb, including shoulder: Secondary | ICD-10-CM | POA: Diagnosis not present

## 2018-09-01 DIAGNOSIS — L814 Other melanin hyperpigmentation: Secondary | ICD-10-CM | POA: Diagnosis not present

## 2018-10-06 DIAGNOSIS — H60501 Unspecified acute noninfective otitis externa, right ear: Secondary | ICD-10-CM | POA: Diagnosis not present

## 2018-10-20 DIAGNOSIS — M545 Low back pain: Secondary | ICD-10-CM | POA: Diagnosis not present

## 2018-10-20 DIAGNOSIS — M25551 Pain in right hip: Secondary | ICD-10-CM | POA: Diagnosis not present

## 2018-10-20 DIAGNOSIS — M5416 Radiculopathy, lumbar region: Secondary | ICD-10-CM | POA: Diagnosis not present

## 2018-10-20 DIAGNOSIS — M4316 Spondylolisthesis, lumbar region: Secondary | ICD-10-CM | POA: Diagnosis not present

## 2018-10-29 ENCOUNTER — Other Ambulatory Visit: Payer: Self-pay

## 2018-10-29 ENCOUNTER — Inpatient Hospital Stay: Payer: BC Managed Care – PPO | Attending: Hematology and Oncology

## 2018-10-29 DIAGNOSIS — C8339 Diffuse large B-cell lymphoma, extranodal and solid organ sites: Secondary | ICD-10-CM | POA: Diagnosis not present

## 2018-10-29 LAB — CBC WITH DIFFERENTIAL (CANCER CENTER ONLY)
Abs Immature Granulocytes: 0.01 10*3/uL (ref 0.00–0.07)
Basophils Absolute: 0 10*3/uL (ref 0.0–0.1)
Basophils Relative: 0 %
Eosinophils Absolute: 0.1 10*3/uL (ref 0.0–0.5)
Eosinophils Relative: 2 %
HCT: 36.7 % — ABNORMAL LOW (ref 39.0–52.0)
Hemoglobin: 12.2 g/dL — ABNORMAL LOW (ref 13.0–17.0)
Immature Granulocytes: 0 %
Lymphocytes Relative: 34 %
Lymphs Abs: 1.6 10*3/uL (ref 0.7–4.0)
MCH: 29.5 pg (ref 26.0–34.0)
MCHC: 33.2 g/dL (ref 30.0–36.0)
MCV: 88.9 fL (ref 80.0–100.0)
Monocytes Absolute: 0.4 10*3/uL (ref 0.1–1.0)
Monocytes Relative: 8 %
Neutro Abs: 2.5 10*3/uL (ref 1.7–7.7)
Neutrophils Relative %: 56 %
Platelet Count: 167 10*3/uL (ref 150–400)
RBC: 4.13 MIL/uL — ABNORMAL LOW (ref 4.22–5.81)
RDW: 13.2 % (ref 11.5–15.5)
WBC Count: 4.5 10*3/uL (ref 4.0–10.5)
nRBC: 0 % (ref 0.0–0.2)

## 2018-10-29 LAB — CMP (CANCER CENTER ONLY)
ALT: 27 U/L (ref 0–44)
AST: 31 U/L (ref 15–41)
Albumin: 3.9 g/dL (ref 3.5–5.0)
Alkaline Phosphatase: 75 U/L (ref 38–126)
Anion gap: 8 (ref 5–15)
BUN: 35 mg/dL — ABNORMAL HIGH (ref 8–23)
CO2: 24 mmol/L (ref 22–32)
Calcium: 8.9 mg/dL (ref 8.9–10.3)
Chloride: 104 mmol/L (ref 98–111)
Creatinine: 1.21 mg/dL (ref 0.61–1.24)
GFR, Est AFR Am: >60 mL/min (ref >60)
GFR, Estimated: >60 mL/min (ref >60)
Glucose, Bld: 90 mg/dL (ref 70–99)
Potassium: 4.3 mmol/L (ref 3.5–5.1)
Sodium: 136 mmol/L (ref 135–145)
Total Bilirubin: 0.4 mg/dL (ref 0.3–1.2)
Total Protein: 8.2 g/dL — ABNORMAL HIGH (ref 6.5–8.1)

## 2018-10-29 NOTE — Assessment & Plan Note (Signed)
Small intestine resection, proximal ileum08/10/2015: Involvement by diffuse large B-cell lymphoma,. Biopsy of peritoneal implant: Invol by a DLBCL.positive for CD20, CD79a,CD10, bcl-6, and bcl-2. CD3 and CD5 highlight scattered admixed T-cells. CD34 and TdT neg  PET/CT 11/02/2015: Nodular thickening within the mesenteries of small bowel 17 mm concerning for peritoneal implant of high-grade lymphoma, activity along the abdominal wound with inflammation suspicion for infection/abscess/fistula of bladder Testicular ultrasound: Normal  Bone marrow biopsy08/20/2017: Hypercellular bone marrow for age 47-60% with trilineage hematopoiesis, plasma cells 9% with Kappa Light chain excess, SPEP 1.5 gm M-Protein Monoclonal paraproteinemia: Bone survey showed bone lytic lesions.Elevated M protein MRI Rt Hip: Negative Referredto [facility] for evaluation and for a second opinion reg the M protein and lytic lesions: They agreed that he does not have myeloma KL ratio is normal.  Awaiting SPEP ----------------------------------------------------------------------------------------------------------------- DLBCL: Prognosis: Revised IPI score: 1 (4 year disease free survival rate: 80%, estimated overall survival 79%) If I uses age of less than or equal to 60, revised IPI score 37, 63-year-old DFS 94%, estimated overall survival 94%  Treatment summary: R-CHOP chemotherapy 6 cycles started 09/08/2017completed 03/01/2016 CT chest abdomen pelvis 04/30/2018: No evidence of lymphoma recurrence Plan to perform another CT scan in 1 year.  Return to clinic in 6 months with labs and follow-up and scans done prior to the visit.

## 2018-10-30 LAB — KAPPA/LAMBDA LIGHT CHAINS
Kappa free light chain: 19.3 mg/L (ref 3.3–19.4)
Kappa, lambda light chain ratio: 1.82 — ABNORMAL HIGH (ref 0.26–1.65)
Lambda free light chains: 10.6 mg/L (ref 5.7–26.3)

## 2018-10-30 LAB — MULTIPLE MYELOMA PANEL, SERUM
Albumin SerPl Elph-Mcnc: 3.9 g/dL (ref 2.9–4.4)
Albumin/Glob SerPl: 1.1 (ref 0.7–1.7)
Alpha 1: 0.2 g/dL (ref 0.0–0.4)
Alpha2 Glob SerPl Elph-Mcnc: 0.6 g/dL (ref 0.4–1.0)
B-Globulin SerPl Elph-Mcnc: 1 g/dL (ref 0.7–1.3)
Gamma Glob SerPl Elph-Mcnc: 1.9 g/dL — ABNORMAL HIGH (ref 0.4–1.8)
Globulin, Total: 3.7 g/dL (ref 2.2–3.9)
IgA: 98 mg/dL (ref 61–437)
IgG (Immunoglobin G), Serum: 2027 mg/dL — ABNORMAL HIGH (ref 603–1613)
IgM (Immunoglobulin M), Srm: 66 mg/dL (ref 20–172)
M Protein SerPl Elph-Mcnc: 1.5 g/dL — ABNORMAL HIGH
Total Protein ELP: 7.6 g/dL (ref 6.0–8.5)

## 2018-11-04 NOTE — Progress Notes (Signed)
Patient Care Team: Orpah Melter, MD as PCP - General (Family Medicine) Clydene Fake, MD as Referring Physician (Hematology and Oncology)  DIAGNOSIS:    ICD-10-CM   1. Diffuse large B-cell lymphoma of extranodal site ([place])  C83.39     SUMMARY OF ONCOLOGIC HISTORY: Oncology History  Diffuse large B-cell lymphoma of extranodal site ([place])  10/17/2015 Initial Diagnosis   Small intestine resection, proximal ileum: Involvement by diffuse large B-cell lymphoma,. Biopsy of peritoneal implant: Invol by a DLBCL.positive for CD20, CD79a,CD10, bcl-6, and bcl-2. CD3 and CD5 highlight scattered admixed T-cells. CD34 and TdT neg   10/31/2015 Pathology Results   Bone marrow biopsy: Hypercellular bone marrow for age 42-60% with trilineage hematopoiesis, plasma cells 9% with Kappa Light chain excess   11/02/2015 PET scan   Nodular thickening within the mesenteries of small bowel 17 mm concerning for peritoneal implant of high-grade lymphoma, activity along the abdominal wound with inflammation suspicion for infection/abscess/fistula of bladder   11/02/2015 Imaging   Ultrasound scrotum: Normal testes, no epididymitis, no varicocele or hydrocele   11/17/2015 - 03/01/2016 Chemotherapy   R CHOP 6 cycles   04/15/2016 PET scan   No residual abnormal hypermetabolic activity to suggest any residual lymphoma. Prior mesenteric activity has resolved. Metabolic activity left central prostate gland. Retracted left testicle      CHIEF COMPLIANT: Follow-up of diffuse large B-cell lymphoma  INTERVAL HISTORY: Edward Steele is a 63 y.o. with above-mentioned history of small bowel diffuse large B-cell lymphoma who was initially treated with surgery to resect the lymphoma, which was followed by 6 cycles of R-CHOP chemotherapy. On 08/01/18 he presented to the ED after passing a kidney stone and underwent a cystoscopy with ureteral stent placement. Labs on 8/20 showed: Hg 12.2, HCT 36.7, WBC 4.5, platelets 167, M protein  1.5, IgG 2027, kappa light chains 19.3, lambda light chain 10.6, ratio 1.82.  He presents to the clinic today for follow-up.   REVIEW OF SYSTEMS:   Constitutional: Denies fevers, chills or abnormal weight loss Eyes: Denies blurriness of vision Ears, nose, mouth, throat, and face: Denies mucositis or sore throat Respiratory: Denies cough, dyspnea or wheezes Cardiovascular: Denies palpitation, chest discomfort Gastrointestinal: Denies nausea, heartburn or change in bowel habits Skin: Denies abnormal skin rashes Lymphatics: Denies new lymphadenopathy or easy bruising Neurological: Denies numbness, tingling or new weaknesses Behavioral/Psych: Mood is stable, no new changes  Extremities: No lower extremity edema Breast: denies any pain or lumps or nodules in either breasts All other systems were reviewed with the patient and are negative.  I have reviewed the past medical history, past surgical history, social history and family history with the patient and they are unchanged from previous note.  ALLERGIES:  is allergic to penicillin g.  MEDICATIONS:  Current Outpatient Medications  Medication Sig Dispense Refill  . acetaminophen (TYLENOL) 325 MG tablet Take 650 mg by mouth every 6 (six) hours as needed for mild pain.    Marland Kitchen albuterol (PROVENTIL HFA;VENTOLIN HFA) 108 (90 Base) MCG/ACT inhaler Inhale 2 puffs into the lungs every 6 (six) hours as needed for wheezing or shortness of breath.     . clonazePAM (KLONOPIN) 1 MG tablet Take 1 mg by mouth 2 (two) times daily.    Marland Kitchen DEXILANT 60 MG capsule Take 60 mg by mouth daily after breakfast.     . FLUoxetine (PROZAC) 20 MG tablet Take 40 mg by mouth 2 (two) times daily.    Marland Kitchen gabapentin (NEURONTIN) 300 MG capsule Take 1  capsule (300 mg total) by mouth at bedtime as needed (nerve pain). 30 capsule 0  . HYDROcodone-acetaminophen (NORCO/VICODIN) 5-325 MG tablet Take 1-2 tablets by mouth every 4 (four) hours as needed for moderate pain. 30 tablet 0  .  meloxicam (MOBIC) 7.5 MG tablet Take 1 tablet (7.5 mg total) by mouth daily. (Patient taking differently: Take 7.5 mg by mouth every evening. )    . rosuvastatin (CRESTOR) 5 MG tablet Take 5 mg by mouth every evening.     . sulfamethoxazole-trimethoprim (BACTRIM DS) 800-160 MG tablet Take 1 tablet by mouth 2 (two) times daily. 20 tablet 0   No current facility-administered medications for this visit.     PHYSICAL EXAMINATION: ECOG PERFORMANCE STATUS: 1 - Symptomatic but completely ambulatory  Vitals:   11/05/18 1125  BP: 138/77  Pulse: 62  Resp: 17  Temp: 98.5 F (36.9 C)  SpO2: 97%   Filed Weights   11/05/18 1125  Weight: 201 lb 8 oz (91.4 kg)    GENERAL: alert, no distress and comfortable SKIN: skin color, texture, turgor are normal, no rashes or significant lesions EYES: normal, Conjunctiva are pink and non-injected, sclera clear OROPHARYNX: no exudate, no erythema and lips, buccal mucosa, and tongue normal  NECK: supple, thyroid normal size, non-tender, without nodularity LYMPH: no palpable lymphadenopathy in the cervical, axillary or inguinal LUNGS: clear to auscultation and percussion with normal breathing effort HEART: regular rate & rhythm and no murmurs and no lower extremity edema ABDOMEN: abdomen soft, non-tender and normal bowel sounds MUSCULOSKELETAL: no cyanosis of digits and no clubbing  NEURO: alert & oriented x 3 with fluent speech, no focal motor/sensory deficits EXTREMITIES: No lower extremity edema  LABORATORY DATA:  I have reviewed the data as listed CMP Latest Ref Rng & Units 10/29/2018 08/01/2018 05/05/2018  Glucose 70 - 99 mg/dL 90 125(H) 107(H)  BUN 8 - 23 mg/dL 35(H) 25(H) 20  Creatinine 0.61 - 1.24 mg/dL 1.21 2.06(H) 1.07  Sodium 135 - 145 mmol/L 136 132(L) 139  Potassium 3.5 - 5.1 mmol/L 4.3 4.7 4.3  Chloride 98 - 111 mmol/L 104 100 104  CO2 22 - 32 mmol/L '24 24 28  ' Calcium 8.9 - 10.3 mg/dL 8.9 8.5(L) 9.2  Total Protein 6.5 - 8.1 g/dL 8.2(H)  6.9 8.2(H)  Total Bilirubin 0.3 - 1.2 mg/dL 0.4 0.8 0.6  Alkaline Phos 38 - 126 U/L 75 58 69  AST 15 - 41 U/L 31 33 34  ALT 0 - 44 U/L '27 27 30    ' Lab Results  Component Value Date   WBC 4.5 10/29/2018   HGB 12.2 (L) 10/29/2018   HCT 36.7 (L) 10/29/2018   MCV 88.9 10/29/2018   PLT 167 10/29/2018   NEUTROABS 2.5 10/29/2018    ASSESSMENT & PLAN:  Diffuse large B-cell lymphoma of extranodal site ([place]) Small intestine resection, proximal ileum08/10/2015: Involvement by diffuse large B-cell lymphoma,. Biopsy of peritoneal implant: Invol by a DLBCL.positive for CD20, CD79a,CD10, bcl-6, and bcl-2. CD3 and CD5 highlight scattered admixed T-cells. CD34 and TdT neg  PET/CT 11/02/2015: Nodular thickening within the mesenteries of small bowel 17 mm concerning for peritoneal implant of high-grade lymphoma, activity along the abdominal wound with inflammation suspicion for infection/abscess/fistula of bladder Testicular ultrasound: Normal  Referredto [facility] for evaluation and for a second opinion reg the M protein and lytic lesions: They agreed that he does not have myeloma  ----------------------------------------------------------------------------------------------------------------- DLBCL: Prognosis: Revised IPI score: 1 (4 year disease free survival rate: 80%, estimated overall survival  79%) If I uses age of less than or equal to 34, revised IPI score 86, 54-year-old DFS 94%, estimated overall survival 94%  Treatment summary: R-CHOP chemotherapy 6 cycles started 09/08/2017completed 03/01/2016 CT chest abdomen pelvis 04/30/2018: No evidence of lymphoma recurrence Plan to perform another CT scan in February and follow-up after that.  Bone marrow biopsy08/20/2017: Hypercellular bone marrow for age 6-60% with trilineage hematopoiesis, plasma cells 9% with Kappa Light chain excess, SPEP 1.5 gm M-Protein Monoclonal paraproteinemia: Bone survey showed bone lytic lesions.Elevated M protein  MRI Rt Hip: Negative 10/29/2018: M protein 1.5 g, kappa 19.3 previously was 15.2 no evidence of any significant progression of paraproteinemia.  Return to clinic in 6 months with labs and follow-up and scans done prior to the visit.  No orders of the defined types were placed in this encounter.  The patient has a good understanding of the overall plan. he agrees with it. he will call with any problems that may develop before the next visit here.  Nicholas Lose, MD 11/05/2018  Julious Oka Dorshimer am acting as scribe for Dr. Nicholas Lose.  I have reviewed the above documentation for accuracy and completeness, and I agree with the above.

## 2018-11-05 ENCOUNTER — Inpatient Hospital Stay (HOSPITAL_BASED_OUTPATIENT_CLINIC_OR_DEPARTMENT_OTHER): Payer: BC Managed Care – PPO | Admitting: Hematology and Oncology

## 2018-11-05 ENCOUNTER — Other Ambulatory Visit: Payer: Self-pay

## 2018-11-05 DIAGNOSIS — C8339 Diffuse large B-cell lymphoma, extranodal and solid organ sites: Secondary | ICD-10-CM

## 2018-11-06 ENCOUNTER — Telehealth: Payer: Self-pay | Admitting: Hematology and Oncology

## 2018-11-06 NOTE — Telephone Encounter (Signed)
I talk with patient regarding schedule  

## 2018-11-09 DIAGNOSIS — M545 Low back pain: Secondary | ICD-10-CM | POA: Diagnosis not present

## 2018-11-09 DIAGNOSIS — M4316 Spondylolisthesis, lumbar region: Secondary | ICD-10-CM | POA: Diagnosis not present

## 2018-11-09 DIAGNOSIS — M5416 Radiculopathy, lumbar region: Secondary | ICD-10-CM | POA: Diagnosis not present

## 2018-11-10 ENCOUNTER — Telehealth: Payer: Self-pay | Admitting: Nurse Practitioner

## 2018-11-10 ENCOUNTER — Other Ambulatory Visit: Payer: Self-pay | Admitting: Physical Medicine and Rehabilitation

## 2018-11-10 DIAGNOSIS — G8929 Other chronic pain: Secondary | ICD-10-CM

## 2018-11-10 DIAGNOSIS — M545 Low back pain, unspecified: Secondary | ICD-10-CM

## 2018-11-10 NOTE — Telephone Encounter (Signed)
Phone call to patient to verify medication list and allergies for myelogram procedure. Pt instructed to hold Prozac for 48hrs prior to myelogram appointment time. Pt verbalized understanding. Pre and post procedure instructions reviewed with pt. 

## 2018-11-12 ENCOUNTER — Ambulatory Visit: Payer: BLUE CROSS/BLUE SHIELD | Admitting: Internal Medicine

## 2018-11-17 ENCOUNTER — Ambulatory Visit
Admission: RE | Admit: 2018-11-17 | Discharge: 2018-11-17 | Disposition: A | Discharge status: Home / Self Care | Payer: BC Managed Care – PPO | Source: Ambulatory Visit | Attending: Physical Medicine and Rehabilitation | Admitting: Physical Medicine and Rehabilitation

## 2018-11-17 DIAGNOSIS — G8929 Other chronic pain: Secondary | ICD-10-CM

## 2018-11-17 DIAGNOSIS — M545 Low back pain, unspecified: Secondary | ICD-10-CM

## 2018-11-17 DIAGNOSIS — M4316 Spondylolisthesis, lumbar region: Secondary | ICD-10-CM | POA: Diagnosis not present

## 2018-11-17 DIAGNOSIS — M48061 Spinal stenosis, lumbar region without neurogenic claudication: Secondary | ICD-10-CM | POA: Diagnosis not present

## 2018-11-17 DIAGNOSIS — M5126 Other intervertebral disc displacement, lumbar region: Secondary | ICD-10-CM | POA: Diagnosis not present

## 2018-11-17 MED ORDER — DIAZEPAM 5 MG PO TABS
5.0000 mg | ORAL_TABLET | Freq: Once | ORAL | Status: AC
  Administered 2018-11-17: 5 mg via ORAL

## 2018-11-17 MED ORDER — IOPAMIDOL (ISOVUE-M 200) INJECTION 41%
15.0000 mL | Freq: Once | INTRAMUSCULAR | Status: AC
  Administered 2018-11-17: 10:00:00 15 mL via INTRATHECAL

## 2018-11-17 NOTE — Discharge Instructions (Signed)
Myelogram Discharge Instructions  1. Go home and rest quietly for the next 24 hours.  It is important to lie flat for the next 24 hours.  Get up only to go to the restroom.  You may lie in the bed or on a couch on your back, your stomach, your left side or your right side.  You may have one pillow under your head.  You may have pillows between your knees while you are on your side or under your knees while you are on your back.  2. DO NOT drive today.  Recline the seat as far back as it will go, while still wearing your seat belt, on the way home.  3. You may get up to go to the bathroom as needed.  You may sit up for 10 minutes to eat.  You may resume your normal diet and medications unless otherwise indicated.  Drink lots of extra fluids today and tomorrow.  4. The incidence of headache, nausea, or vomiting is about 5% (one in 20 patients).  If you develop a headache, lie flat and drink plenty of fluids until the headache goes away.  Caffeinated beverages may be helpful.  If you develop severe nausea and vomiting or a headache that does not go away with flat bed rest, call [phone].  5. You may resume normal activities after your 24 hours of bed rest is over; however, do not exert yourself strongly or do any heavy lifting tomorrow. If when you get up you have a headache when standing, go back to bed and force fluids for another 24 hours.  6. Call your physician for a follow-up appointment.  The results of your myelogram will be sent directly to your physician by the following day.  7. If you have any questions or if complications develop after you arrive home, please call [phone].  Discharge instructions have been explained to the patient.  The patient, or the person responsible for the patient, fully understands these instructions.  YOU MAY RESTART YOUR PROZAC TOMORROW 11/18/2018 AT 09:30AM.

## 2018-11-30 DIAGNOSIS — M545 Low back pain: Secondary | ICD-10-CM | POA: Diagnosis not present

## 2018-11-30 DIAGNOSIS — M5416 Radiculopathy, lumbar region: Secondary | ICD-10-CM | POA: Diagnosis not present

## 2018-11-30 DIAGNOSIS — M4316 Spondylolisthesis, lumbar region: Secondary | ICD-10-CM | POA: Diagnosis not present

## 2018-12-07 DIAGNOSIS — Z23 Encounter for immunization: Secondary | ICD-10-CM | POA: Diagnosis not present

## 2019-01-28 DIAGNOSIS — L738 Other specified follicular disorders: Secondary | ICD-10-CM | POA: Diagnosis not present

## 2019-01-28 DIAGNOSIS — L570 Actinic keratosis: Secondary | ICD-10-CM | POA: Diagnosis not present

## 2019-01-28 DIAGNOSIS — Z08 Encounter for follow-up examination after completed treatment for malignant neoplasm: Secondary | ICD-10-CM | POA: Diagnosis not present

## 2019-01-28 DIAGNOSIS — D485 Neoplasm of uncertain behavior of skin: Secondary | ICD-10-CM | POA: Diagnosis not present

## 2019-01-28 DIAGNOSIS — L820 Inflamed seborrheic keratosis: Secondary | ICD-10-CM | POA: Diagnosis not present

## 2019-01-28 DIAGNOSIS — Z09 Encounter for follow-up examination after completed treatment for conditions other than malignant neoplasm: Secondary | ICD-10-CM | POA: Diagnosis not present

## 2019-01-28 DIAGNOSIS — L821 Other seborrheic keratosis: Secondary | ICD-10-CM | POA: Diagnosis not present

## 2019-01-28 DIAGNOSIS — Z85828 Personal history of other malignant neoplasm of skin: Secondary | ICD-10-CM | POA: Diagnosis not present

## 2019-02-09 ENCOUNTER — Ambulatory Visit: Payer: BC Managed Care – PPO | Admitting: Internal Medicine

## 2019-02-16 DIAGNOSIS — Z5181 Encounter for therapeutic drug level monitoring: Secondary | ICD-10-CM | POA: Diagnosis not present

## 2019-02-16 DIAGNOSIS — F419 Anxiety disorder, unspecified: Secondary | ICD-10-CM | POA: Diagnosis not present

## 2019-02-16 DIAGNOSIS — F5101 Primary insomnia: Secondary | ICD-10-CM | POA: Diagnosis not present

## 2019-02-16 DIAGNOSIS — Z23 Encounter for immunization: Secondary | ICD-10-CM | POA: Diagnosis not present

## 2019-02-16 DIAGNOSIS — Z0000 Encounter for general adult medical examination without abnormal findings: Secondary | ICD-10-CM | POA: Diagnosis not present

## 2019-02-16 DIAGNOSIS — Z125 Encounter for screening for malignant neoplasm of prostate: Secondary | ICD-10-CM | POA: Diagnosis not present

## 2019-03-10 DIAGNOSIS — Z85828 Personal history of other malignant neoplasm of skin: Secondary | ICD-10-CM | POA: Diagnosis not present

## 2019-03-10 DIAGNOSIS — L570 Actinic keratosis: Secondary | ICD-10-CM | POA: Diagnosis not present

## 2019-03-10 DIAGNOSIS — Z872 Personal history of diseases of the skin and subcutaneous tissue: Secondary | ICD-10-CM | POA: Diagnosis not present

## 2019-03-10 DIAGNOSIS — Z09 Encounter for follow-up examination after completed treatment for conditions other than malignant neoplasm: Secondary | ICD-10-CM | POA: Diagnosis not present

## 2019-03-10 DIAGNOSIS — Z08 Encounter for follow-up examination after completed treatment for malignant neoplasm: Secondary | ICD-10-CM | POA: Diagnosis not present

## 2019-05-01 ENCOUNTER — Encounter: Payer: Self-pay | Admitting: Hematology and Oncology

## 2019-05-07 ENCOUNTER — Ambulatory Visit (HOSPITAL_COMMUNITY): Payer: BC Managed Care – PPO

## 2019-05-07 ENCOUNTER — Other Ambulatory Visit: Payer: BC Managed Care – PPO

## 2019-05-14 ENCOUNTER — Ambulatory Visit: Payer: BC Managed Care – PPO | Admitting: Hematology and Oncology

## 2019-06-10 DIAGNOSIS — L821 Other seborrheic keratosis: Secondary | ICD-10-CM | POA: Diagnosis not present

## 2019-06-10 DIAGNOSIS — L570 Actinic keratosis: Secondary | ICD-10-CM | POA: Diagnosis not present

## 2019-06-10 DIAGNOSIS — Z08 Encounter for follow-up examination after completed treatment for malignant neoplasm: Secondary | ICD-10-CM | POA: Diagnosis not present

## 2019-06-10 DIAGNOSIS — L820 Inflamed seborrheic keratosis: Secondary | ICD-10-CM | POA: Diagnosis not present

## 2019-06-10 DIAGNOSIS — L738 Other specified follicular disorders: Secondary | ICD-10-CM | POA: Diagnosis not present

## 2019-06-10 DIAGNOSIS — I788 Other diseases of capillaries: Secondary | ICD-10-CM | POA: Diagnosis not present

## 2019-06-10 DIAGNOSIS — D485 Neoplasm of uncertain behavior of skin: Secondary | ICD-10-CM | POA: Diagnosis not present

## 2019-06-22 DIAGNOSIS — M545 Low back pain: Secondary | ICD-10-CM | POA: Diagnosis not present

## 2019-06-23 ENCOUNTER — Other Ambulatory Visit: Payer: Self-pay | Admitting: Physical Medicine and Rehabilitation

## 2019-06-23 ENCOUNTER — Telehealth: Payer: Self-pay

## 2019-06-23 DIAGNOSIS — G8929 Other chronic pain: Secondary | ICD-10-CM

## 2019-06-23 NOTE — Telephone Encounter (Signed)
Phone call to patient to verify medication list and allergies for myelogram procedure. Pt instructed to hold Prozac for 48hrs prior to myelogram appointment time. Pt verbalized understanding.Pt also instructed that he would need to lay flat for at least 24 hours after myelogram procedure, he would need a driver the day of the test, and he would be with [place] around 2 hours the day of. Pt verbalized understanding and has our phone number if he were to have any questions.

## 2019-06-28 ENCOUNTER — Other Ambulatory Visit: Payer: BC Managed Care – PPO

## 2019-06-28 ENCOUNTER — Inpatient Hospital Stay: Admission: RE | Admit: 2019-06-28 | Payer: BC Managed Care – PPO | Source: Ambulatory Visit

## 2019-07-12 ENCOUNTER — Other Ambulatory Visit: Payer: BC Managed Care – PPO

## 2019-07-13 DIAGNOSIS — H52213 Irregular astigmatism, bilateral: Secondary | ICD-10-CM | POA: Diagnosis not present

## 2019-07-20 ENCOUNTER — Ambulatory Visit
Admission: RE | Admit: 2019-07-20 | Discharge: 2019-07-20 | Disposition: A | Discharge status: Home / Self Care | Payer: BC Managed Care – PPO | Source: Ambulatory Visit | Attending: Physical Medicine and Rehabilitation | Admitting: Physical Medicine and Rehabilitation

## 2019-07-20 DIAGNOSIS — G8929 Other chronic pain: Secondary | ICD-10-CM

## 2019-07-20 DIAGNOSIS — M545 Low back pain, unspecified: Secondary | ICD-10-CM

## 2019-07-20 DIAGNOSIS — M5020 Other cervical disc displacement, unspecified cervical region: Secondary | ICD-10-CM | POA: Diagnosis not present

## 2019-07-20 DIAGNOSIS — M47812 Spondylosis without myelopathy or radiculopathy, cervical region: Secondary | ICD-10-CM | POA: Diagnosis not present

## 2019-07-20 DIAGNOSIS — M4312 Spondylolisthesis, cervical region: Secondary | ICD-10-CM | POA: Diagnosis not present

## 2019-07-20 DIAGNOSIS — M5126 Other intervertebral disc displacement, lumbar region: Secondary | ICD-10-CM | POA: Diagnosis not present

## 2019-07-20 MED ORDER — IOPAMIDOL (ISOVUE-M 300) INJECTION 61%
10.0000 mL | Freq: Once | INTRAMUSCULAR | Status: AC
  Administered 2019-07-20: 10 mL via INTRATHECAL

## 2019-07-20 MED ORDER — DIAZEPAM 5 MG PO TABS
5.0000 mg | ORAL_TABLET | Freq: Once | ORAL | Status: AC
  Administered 2019-07-20: 5 mg via ORAL

## 2019-07-20 MED ORDER — ONDANSETRON HCL 4 MG/2ML IJ SOLN: 4.0000 mg | Freq: Four times a day (QID) | INTRAMUSCULAR | Status: DC | PRN

## 2019-07-20 NOTE — Discharge Instructions (Signed)
Myelogram Discharge Instructions  1. Go home and rest quietly for the next 24 hours.  It is important to lie flat for the next 24 hours.  Get up only to go to the restroom.  You may lie in the bed or on a couch on your back, your stomach, your left side or your right side.  You may have one pillow under your head.  You may have pillows between your knees while you are on your side or under your knees while you are on your back.  2. DO NOT drive today.  Recline the seat as far back as it will go, while still wearing your seat belt, on the way home.  3. You may get up to go to the bathroom as needed.  You may sit up for 10 minutes to eat.  You may resume your normal diet and medications unless otherwise indicated.  Drink lots of extra fluids today and tomorrow.  4. The incidence of headache, nausea, or vomiting is about 5% (one in 20 patients).  If you develop a headache, lie flat and drink plenty of fluids until the headache goes away.  Caffeinated beverages may be helpful.  If you develop severe nausea and vomiting or a headache that does not go away with flat bed rest, call [phone].  5. You may resume normal activities after your 24 hours of bed rest is over; however, do not exert yourself strongly or do any heavy lifting tomorrow. If when you get up you have a headache when standing, go back to bed and force fluids for another 24 hours.  6. Call your physician for a follow-up appointment.  The results of your myelogram will be sent directly to your physician by the following day.  7. If you have any questions or if complications develop after you arrive home, please call [phone].  Discharge instructions have been explained to the patient.  The patient, or the person responsible for the patient, fully understands these instructions.  YOU MAY RESTART YOUR PROZAC TOMORROW 07/21/19 AT 09:30AM.

## 2019-08-02 DIAGNOSIS — M545 Low back pain: Secondary | ICD-10-CM | POA: Diagnosis not present

## 2019-08-13 DIAGNOSIS — R200 Anesthesia of skin: Secondary | ICD-10-CM | POA: Diagnosis not present

## 2019-08-13 DIAGNOSIS — M545 Low back pain: Secondary | ICD-10-CM | POA: Diagnosis not present

## 2019-08-13 DIAGNOSIS — M542 Cervicalgia: Secondary | ICD-10-CM | POA: Diagnosis not present

## 2019-08-17 ENCOUNTER — Other Ambulatory Visit: Payer: Self-pay | Admitting: Neurological Surgery

## 2019-08-17 DIAGNOSIS — M542 Cervicalgia: Secondary | ICD-10-CM | POA: Diagnosis not present

## 2019-08-17 DIAGNOSIS — Z01818 Encounter for other preprocedural examination: Secondary | ICD-10-CM | POA: Diagnosis not present

## 2019-08-17 DIAGNOSIS — G959 Disease of spinal cord, unspecified: Secondary | ICD-10-CM | POA: Diagnosis not present

## 2019-08-17 DIAGNOSIS — K219 Gastro-esophageal reflux disease without esophagitis: Secondary | ICD-10-CM | POA: Diagnosis not present

## 2019-08-19 ENCOUNTER — Encounter (HOSPITAL_COMMUNITY): Payer: Self-pay | Admitting: Neurological Surgery

## 2019-08-19 ENCOUNTER — Other Ambulatory Visit: Payer: Self-pay

## 2019-08-19 ENCOUNTER — Other Ambulatory Visit (HOSPITAL_COMMUNITY)
Admission: RE | Admit: 2019-08-19 | Discharge: 2019-08-19 | Disposition: A | Discharge status: Home / Self Care | Payer: BC Managed Care – PPO | Source: Ambulatory Visit | Attending: Neurological Surgery | Admitting: Neurological Surgery

## 2019-08-19 DIAGNOSIS — Z20822 Contact with and (suspected) exposure to covid-19: Secondary | ICD-10-CM | POA: Diagnosis not present

## 2019-08-19 DIAGNOSIS — Z01812 Encounter for preprocedural laboratory examination: Secondary | ICD-10-CM | POA: Diagnosis not present

## 2019-08-19 LAB — SARS CORONAVIRUS 2 (TAT 6-24 HRS): SARS Coronavirus 2: NEGATIVE

## 2019-08-19 NOTE — Progress Notes (Signed)
Patient called stating he had not received a phone call regarding pre-op instructions. Asked if he could ask just a few questions. Completed pre-op call with patient at this time in order to answer all questions. Patient verbalized understanding.

## 2019-08-20 ENCOUNTER — Other Ambulatory Visit: Payer: Self-pay

## 2019-08-20 ENCOUNTER — Encounter (HOSPITAL_COMMUNITY)
Admission: RE | Disposition: A | Discharge status: Home / Self Care | Payer: Self-pay | Source: Home / Self Care | Attending: Neurological Surgery

## 2019-08-20 ENCOUNTER — Encounter (HOSPITAL_COMMUNITY): Payer: Self-pay | Admitting: Neurological Surgery

## 2019-08-20 ENCOUNTER — Ambulatory Visit (HOSPITAL_COMMUNITY): Payer: BC Managed Care – PPO | Admitting: Certified Registered Nurse Anesthetist

## 2019-08-20 ENCOUNTER — Observation Stay (HOSPITAL_COMMUNITY)
Admission: RE | Admit: 2019-08-20 | Discharge: 2019-08-20 | Disposition: A | Discharge status: Home / Self Care | Payer: BC Managed Care – PPO | Attending: Neurological Surgery | Admitting: Neurological Surgery

## 2019-08-20 ENCOUNTER — Ambulatory Visit (HOSPITAL_COMMUNITY): Payer: BC Managed Care – PPO

## 2019-08-20 DIAGNOSIS — G4733 Obstructive sleep apnea (adult) (pediatric): Secondary | ICD-10-CM | POA: Diagnosis not present

## 2019-08-20 DIAGNOSIS — Z8572 Personal history of non-Hodgkin lymphomas: Secondary | ICD-10-CM | POA: Insufficient documentation

## 2019-08-20 DIAGNOSIS — M4802 Spinal stenosis, cervical region: Principal | ICD-10-CM | POA: Insufficient documentation

## 2019-08-20 DIAGNOSIS — Z981 Arthrodesis status: Secondary | ICD-10-CM | POA: Diagnosis not present

## 2019-08-20 DIAGNOSIS — Z87442 Personal history of urinary calculi: Secondary | ICD-10-CM | POA: Diagnosis not present

## 2019-08-20 DIAGNOSIS — Z79899 Other long term (current) drug therapy: Secondary | ICD-10-CM | POA: Diagnosis not present

## 2019-08-20 DIAGNOSIS — F419 Anxiety disorder, unspecified: Secondary | ICD-10-CM | POA: Diagnosis not present

## 2019-08-20 DIAGNOSIS — D649 Anemia, unspecified: Secondary | ICD-10-CM | POA: Insufficient documentation

## 2019-08-20 DIAGNOSIS — K5900 Constipation, unspecified: Secondary | ICD-10-CM | POA: Diagnosis not present

## 2019-08-20 DIAGNOSIS — Z85828 Personal history of other malignant neoplasm of skin: Secondary | ICD-10-CM | POA: Insufficient documentation

## 2019-08-20 DIAGNOSIS — F329 Major depressive disorder, single episode, unspecified: Secondary | ICD-10-CM | POA: Diagnosis not present

## 2019-08-20 DIAGNOSIS — M545 Low back pain: Secondary | ICD-10-CM | POA: Insufficient documentation

## 2019-08-20 DIAGNOSIS — M4712 Other spondylosis with myelopathy, cervical region: Secondary | ICD-10-CM | POA: Insufficient documentation

## 2019-08-20 DIAGNOSIS — Z9842 Cataract extraction status, left eye: Secondary | ICD-10-CM | POA: Insufficient documentation

## 2019-08-20 DIAGNOSIS — Z9841 Cataract extraction status, right eye: Secondary | ICD-10-CM | POA: Diagnosis not present

## 2019-08-20 DIAGNOSIS — G8929 Other chronic pain: Secondary | ICD-10-CM | POA: Insufficient documentation

## 2019-08-20 DIAGNOSIS — M47892 Other spondylosis, cervical region: Secondary | ICD-10-CM | POA: Diagnosis not present

## 2019-08-20 DIAGNOSIS — R269 Unspecified abnormalities of gait and mobility: Secondary | ICD-10-CM | POA: Insufficient documentation

## 2019-08-20 DIAGNOSIS — K219 Gastro-esophageal reflux disease without esophagitis: Secondary | ICD-10-CM | POA: Insufficient documentation

## 2019-08-20 DIAGNOSIS — M5001 Cervical disc disorder with myelopathy,  high cervical region: Secondary | ICD-10-CM | POA: Diagnosis not present

## 2019-08-20 DIAGNOSIS — Z880 Allergy status to penicillin: Secondary | ICD-10-CM | POA: Insufficient documentation

## 2019-08-20 DIAGNOSIS — Z803 Family history of malignant neoplasm of breast: Secondary | ICD-10-CM | POA: Insufficient documentation

## 2019-08-20 DIAGNOSIS — G959 Disease of spinal cord, unspecified: Secondary | ICD-10-CM

## 2019-08-20 DIAGNOSIS — Z791 Long term (current) use of non-steroidal anti-inflammatories (NSAID): Secondary | ICD-10-CM | POA: Diagnosis not present

## 2019-08-20 DIAGNOSIS — Z01818 Encounter for other preprocedural examination: Secondary | ICD-10-CM | POA: Diagnosis not present

## 2019-08-20 HISTORY — PX: ANTERIOR CERVICAL DECOMP/DISCECTOMY FUSION: SHX1161

## 2019-08-20 LAB — CBC
HCT: 38.2 % — ABNORMAL LOW (ref 39.0–52.0)
Hemoglobin: 12.8 g/dL — ABNORMAL LOW (ref 13.0–17.0)
MCH: 30.8 pg (ref 26.0–34.0)
MCHC: 33.5 g/dL (ref 30.0–36.0)
MCV: 91.8 fL (ref 80.0–100.0)
Platelets: 149 10*3/uL — ABNORMAL LOW (ref 150–400)
RBC: 4.16 MIL/uL — ABNORMAL LOW (ref 4.22–5.81)
RDW: 13.2 % (ref 11.5–15.5)
WBC: 4.2 10*3/uL (ref 4.0–10.5)
nRBC: 0 % (ref 0.0–0.2)

## 2019-08-20 LAB — CREATININE, SERUM
Creatinine, Ser: 1.29 mg/dL — ABNORMAL HIGH (ref 0.61–1.24)
GFR calc Af Amer: >60 mL/min (ref >60)
GFR calc non Af Amer: 58 mL/min — ABNORMAL LOW (ref >60)

## 2019-08-20 LAB — SURGICAL PCR SCREEN
MRSA, PCR: NEGATIVE
Staphylococcus aureus: NEGATIVE

## 2019-08-20 SURGERY — ANTERIOR CERVICAL DECOMPRESSION/DISCECTOMY FUSION 1 LEVEL
Anesthesia: General | Site: Neck

## 2019-08-20 MED ORDER — OXYCODONE HCL 5 MG PO TABS
ORAL_TABLET | ORAL | Status: AC
  Filled 2019-08-20: qty 1

## 2019-08-20 MED ORDER — ALBUTEROL SULFATE (2.5 MG/3ML) 0.083% IN NEBU: 2.5000 mg | INHALATION_SOLUTION | Freq: Four times a day (QID) | RESPIRATORY_TRACT | Status: DC | PRN

## 2019-08-20 MED ORDER — PANTOPRAZOLE SODIUM 40 MG PO TBEC
40.0000 mg | DELAYED_RELEASE_TABLET | Freq: Every day | ORAL | Status: DC
  Administered 2019-08-20: 40 mg via ORAL

## 2019-08-20 MED ORDER — OXYCODONE HCL 5 MG PO TABS: 5.0000 mg | ORAL_TABLET | ORAL | 0 refills | Status: DC | PRN

## 2019-08-20 MED ORDER — ACETAMINOPHEN 325 MG PO TABS: 650.0000 mg | ORAL_TABLET | ORAL | Status: DC | PRN

## 2019-08-20 MED ORDER — FENTANYL CITRATE (PF) 100 MCG/2ML IJ SOLN
INTRAMUSCULAR | Status: DC | PRN
  Administered 2019-08-20 (×3): 50 ug via INTRAVENOUS

## 2019-08-20 MED ORDER — THROMBIN 5000 UNITS EX SOLR
CUTANEOUS | Status: AC
  Filled 2019-08-20: qty 15000

## 2019-08-20 MED ORDER — METHOCARBAMOL 500 MG PO TABS
ORAL_TABLET | ORAL | Status: AC
  Filled 2019-08-20: qty 1

## 2019-08-20 MED ORDER — MENTHOL 3 MG MT LOZG
1.0000 | LOZENGE | OROMUCOSAL | Status: DC | PRN
  Filled 2019-08-20: qty 9

## 2019-08-20 MED ORDER — METHOCARBAMOL 500 MG PO TABS
500.0000 mg | ORAL_TABLET | Freq: Four times a day (QID) | ORAL | Status: DC | PRN
  Administered 2019-08-20: 500 mg via ORAL

## 2019-08-20 MED ORDER — SODIUM CHLORIDE 0.9 % IV SOLN
250.0000 mL | INTRAVENOUS | Status: DC
  Administered 2019-08-20: 250 mL via INTRAVENOUS

## 2019-08-20 MED ORDER — 0.9 % SODIUM CHLORIDE (POUR BTL) OPTIME
TOPICAL | Status: DC | PRN
  Administered 2019-08-20: 1000 mL

## 2019-08-20 MED ORDER — CLONAZEPAM 1 MG PO TABS: 1.0000 mg | ORAL_TABLET | Freq: Two times a day (BID) | ORAL | Status: DC

## 2019-08-20 MED ORDER — MIDAZOLAM HCL 5 MG/5ML IJ SOLN
INTRAMUSCULAR | Status: DC | PRN
  Administered 2019-08-20: 2 mg via INTRAVENOUS

## 2019-08-20 MED ORDER — VANCOMYCIN HCL IN DEXTROSE 1-5 GM/200ML-% IV SOLN
1000.0000 mg | INTRAVENOUS | Status: AC
  Administered 2019-08-20: 1000 mg via INTRAVENOUS
  Filled 2019-08-20: qty 200

## 2019-08-20 MED ORDER — EPHEDRINE SULFATE-NACL 50-0.9 MG/10ML-% IV SOSY
PREFILLED_SYRINGE | INTRAVENOUS | Status: DC | PRN
  Administered 2019-08-20 (×3): 10 mg via INTRAVENOUS

## 2019-08-20 MED ORDER — PHENYLEPHRINE 40 MCG/ML (10ML) SYRINGE FOR IV PUSH (FOR BLOOD PRESSURE SUPPORT)
PREFILLED_SYRINGE | INTRAVENOUS | Status: DC | PRN
  Administered 2019-08-20 (×2): 80 ug via INTRAVENOUS

## 2019-08-20 MED ORDER — FENTANYL CITRATE (PF) 250 MCG/5ML IJ SOLN
INTRAMUSCULAR | Status: AC
  Filled 2019-08-20: qty 5

## 2019-08-20 MED ORDER — BUPIVACAINE HCL (PF) 0.25 % IJ SOLN
INTRAMUSCULAR | Status: DC | PRN
  Administered 2019-08-20: 4 mL

## 2019-08-20 MED ORDER — PHENOL 1.4 % MT LIQD
1.0000 | OROMUCOSAL | Status: DC | PRN
  Administered 2019-08-20: 1 via OROMUCOSAL
  Filled 2019-08-20: qty 177

## 2019-08-20 MED ORDER — LACTATED RINGERS IV SOLN: INTRAVENOUS | Status: DC

## 2019-08-20 MED ORDER — LIDOCAINE 2% (20 MG/ML) 5 ML SYRINGE
INTRAMUSCULAR | Status: DC | PRN
  Administered 2019-08-20: 40 mg via INTRAVENOUS

## 2019-08-20 MED ORDER — THROMBIN 5000 UNITS EX SOLR
OROMUCOSAL | Status: DC | PRN
  Administered 2019-08-20: 5 mL via TOPICAL

## 2019-08-20 MED ORDER — ONDANSETRON HCL 4 MG/2ML IJ SOLN
INTRAMUSCULAR | Status: AC
  Filled 2019-08-20: qty 2

## 2019-08-20 MED ORDER — PROPOFOL 10 MG/ML IV BOLUS
INTRAVENOUS | Status: DC | PRN
  Administered 2019-08-20: 160 mg via INTRAVENOUS

## 2019-08-20 MED ORDER — METHOCARBAMOL 1000 MG/10ML IJ SOLN
500.0000 mg | Freq: Four times a day (QID) | INTRAVENOUS | Status: DC | PRN
  Filled 2019-08-20: qty 5

## 2019-08-20 MED ORDER — MORPHINE SULFATE (PF) 2 MG/ML IV SOLN: 2.0000 mg | INTRAVENOUS | Status: DC | PRN

## 2019-08-20 MED ORDER — BUPIVACAINE HCL (PF) 0.25 % IJ SOLN
INTRAMUSCULAR | Status: AC
  Filled 2019-08-20: qty 30

## 2019-08-20 MED ORDER — MEPERIDINE HCL 25 MG/ML IJ SOLN
6.2500 mg | INTRAMUSCULAR | Status: DC | PRN
  Administered 2019-08-20: 12.5 mg via INTRAVENOUS

## 2019-08-20 MED ORDER — THROMBIN 5000 UNITS EX SOLR
CUTANEOUS | Status: DC | PRN
  Administered 2019-08-20 (×2): 5000 [IU] via TOPICAL

## 2019-08-20 MED ORDER — ONDANSETRON HCL 4 MG/2ML IJ SOLN: 4.0000 mg | Freq: Once | INTRAMUSCULAR | Status: DC | PRN

## 2019-08-20 MED ORDER — DEXAMETHASONE SODIUM PHOSPHATE 4 MG/ML IJ SOLN
4.0000 mg | Freq: Four times a day (QID) | INTRAMUSCULAR | Status: DC
  Administered 2019-08-20: 4 mg via INTRAVENOUS
  Filled 2019-08-20: qty 1

## 2019-08-20 MED ORDER — DEXAMETHASONE SODIUM PHOSPHATE 10 MG/ML IJ SOLN
10.0000 mg | Freq: Once | INTRAMUSCULAR | Status: AC
  Administered 2019-08-20: 5 mg via INTRAVENOUS
  Filled 2019-08-20: qty 1

## 2019-08-20 MED ORDER — CHLORHEXIDINE GLUCONATE 0.12 % MT SOLN
15.0000 mL | Freq: Once | OROMUCOSAL | Status: AC
  Administered 2019-08-20: 15 mL via OROMUCOSAL
  Filled 2019-08-20: qty 15

## 2019-08-20 MED ORDER — SENNA 8.6 MG PO TABS: 1.0000 | ORAL_TABLET | Freq: Two times a day (BID) | ORAL | Status: DC

## 2019-08-20 MED ORDER — SODIUM CHLORIDE 0.9% FLUSH
3.0000 mL | Freq: Two times a day (BID) | INTRAVENOUS | Status: DC
  Administered 2019-08-20: 3 mL via INTRAVENOUS

## 2019-08-20 MED ORDER — SODIUM CHLORIDE 0.9 % IV SOLN
INTRAVENOUS | Status: DC | PRN
  Administered 2019-08-20: 500 mL

## 2019-08-20 MED ORDER — MIDAZOLAM HCL 2 MG/2ML IJ SOLN
INTRAMUSCULAR | Status: AC
  Filled 2019-08-20: qty 2

## 2019-08-20 MED ORDER — ORAL CARE MOUTH RINSE: 15.0000 mL | Freq: Once | OROMUCOSAL | Status: AC

## 2019-08-20 MED ORDER — ONDANSETRON HCL 4 MG PO TABS: 4.0000 mg | ORAL_TABLET | Freq: Four times a day (QID) | ORAL | Status: DC | PRN

## 2019-08-20 MED ORDER — ONDANSETRON HCL 4 MG/2ML IJ SOLN
INTRAMUSCULAR | Status: DC | PRN
  Administered 2019-08-20: 4 mg via INTRAVENOUS

## 2019-08-20 MED ORDER — MEPERIDINE HCL 25 MG/ML IJ SOLN
INTRAMUSCULAR | Status: AC
  Administered 2019-08-20: 6.25 mg via INTRAVENOUS
  Filled 2019-08-20: qty 1

## 2019-08-20 MED ORDER — EPHEDRINE 5 MG/ML INJ
INTRAVENOUS | Status: AC
  Filled 2019-08-20: qty 10

## 2019-08-20 MED ORDER — SODIUM CHLORIDE 0.9% FLUSH: 3.0000 mL | INTRAVENOUS | Status: DC | PRN

## 2019-08-20 MED ORDER — OXYCODONE HCL 5 MG PO TABS
5.0000 mg | ORAL_TABLET | ORAL | Status: DC | PRN
  Administered 2019-08-20: 5 mg via ORAL
  Filled 2019-08-20: qty 1

## 2019-08-20 MED ORDER — ROCURONIUM BROMIDE 10 MG/ML (PF) SYRINGE
PREFILLED_SYRINGE | INTRAVENOUS | Status: DC | PRN
  Administered 2019-08-20: 60 mg via INTRAVENOUS

## 2019-08-20 MED ORDER — PROPOFOL 10 MG/ML IV BOLUS
INTRAVENOUS | Status: AC
  Filled 2019-08-20: qty 40

## 2019-08-20 MED ORDER — FENTANYL CITRATE (PF) 100 MCG/2ML IJ SOLN
25.0000 ug | INTRAMUSCULAR | Status: DC | PRN
  Administered 2019-08-20: 50 ug via INTRAVENOUS

## 2019-08-20 MED ORDER — ROCURONIUM BROMIDE 10 MG/ML (PF) SYRINGE
PREFILLED_SYRINGE | INTRAVENOUS | Status: AC
  Filled 2019-08-20: qty 10

## 2019-08-20 MED ORDER — POTASSIUM CHLORIDE IN NACL 20-0.9 MEQ/L-% IV SOLN: INTRAVENOUS | Status: DC

## 2019-08-20 MED ORDER — LIDOCAINE 2% (20 MG/ML) 5 ML SYRINGE
INTRAMUSCULAR | Status: AC
  Filled 2019-08-20: qty 5

## 2019-08-20 MED ORDER — DEXAMETHASONE 4 MG PO TABS: 4.0000 mg | ORAL_TABLET | Freq: Four times a day (QID) | ORAL | Status: DC

## 2019-08-20 MED ORDER — FENTANYL CITRATE (PF) 100 MCG/2ML IJ SOLN
INTRAMUSCULAR | Status: AC
  Filled 2019-08-20: qty 2

## 2019-08-20 MED ORDER — ONDANSETRON HCL 4 MG/2ML IJ SOLN: 4.0000 mg | Freq: Four times a day (QID) | INTRAMUSCULAR | Status: DC | PRN

## 2019-08-20 MED ORDER — CHLORHEXIDINE GLUCONATE CLOTH 2 % EX PADS: 6.0000 | MEDICATED_PAD | Freq: Once | CUTANEOUS | Status: DC

## 2019-08-20 MED ORDER — PHENYLEPHRINE 40 MCG/ML (10ML) SYRINGE FOR IV PUSH (FOR BLOOD PRESSURE SUPPORT)
PREFILLED_SYRINGE | INTRAVENOUS | Status: AC
  Filled 2019-08-20: qty 10

## 2019-08-20 MED ORDER — HEMOSTATIC AGENTS (NO CHARGE) OPTIME
TOPICAL | Status: DC | PRN
  Administered 2019-08-20: 1 via TOPICAL

## 2019-08-20 MED ORDER — VANCOMYCIN HCL IN DEXTROSE 1-5 GM/200ML-% IV SOLN: 1000.0000 mg | Freq: Once | INTRAVENOUS | Status: DC

## 2019-08-20 MED ORDER — SUGAMMADEX SODIUM 200 MG/2ML IV SOLN
INTRAVENOUS | Status: DC | PRN
Start: 2019-08-20 — End: 2019-08-20
  Administered 2019-08-20: 200 mg via INTRAVENOUS

## 2019-08-20 MED ORDER — DEXAMETHASONE SODIUM PHOSPHATE 10 MG/ML IJ SOLN
INTRAMUSCULAR | Status: AC
  Filled 2019-08-20: qty 1

## 2019-08-20 MED ORDER — OXYCODONE HCL 5 MG/5ML PO SOLN: 5.0000 mg | Freq: Once | ORAL | Status: DC | PRN

## 2019-08-20 MED ORDER — GABAPENTIN 300 MG PO CAPS
300.0000 mg | ORAL_CAPSULE | Freq: Two times a day (BID) | ORAL | Status: DC
  Administered 2019-08-20: 300 mg via ORAL
  Filled 2019-08-20: qty 1

## 2019-08-20 MED ORDER — ACETAMINOPHEN 650 MG RE SUPP: 650.0000 mg | RECTAL | Status: DC | PRN

## 2019-08-20 MED ORDER — OXYCODONE HCL 5 MG PO TABS: 5.0000 mg | ORAL_TABLET | Freq: Once | ORAL | Status: DC | PRN

## 2019-08-20 MED ORDER — ADULT MULTIVITAMIN W/MINERALS CH: 1.0000 | ORAL_TABLET | Freq: Every day | ORAL | Status: DC

## 2019-08-20 MED ORDER — FLUOXETINE HCL 20 MG PO CAPS: 40.0000 mg | ORAL_CAPSULE | Freq: Two times a day (BID) | ORAL | Status: DC

## 2019-08-20 SURGICAL SUPPLY — 49 items
BAG DECANTER FOR FLEXI CONT (MISCELLANEOUS) ×3 IMPLANT
BAND RUBBER #18 3X1/16 STRL (MISCELLANEOUS) ×6 IMPLANT
BASKET BONE COLLECTION (BASKET) IMPLANT
BENZOIN TINCTURE PRP APPL 2/3 (GAUZE/BANDAGES/DRESSINGS) ×3 IMPLANT
BIT DRILL 14MM (INSTRUMENTS) ×1 IMPLANT
CANISTER SUCT 3000ML PPV (MISCELLANEOUS) ×3 IMPLANT
CARTRIDGE OIL MAESTRO DRILL (MISCELLANEOUS) ×1 IMPLANT
CLOSURE WOUND 1/2 X4 (GAUZE/BANDAGES/DRESSINGS) ×1
COVER WAND RF STERILE (DRAPES) ×3 IMPLANT
DIFFUSER DRILL AIR PNEUMATIC (MISCELLANEOUS) ×3 IMPLANT
DRAPE C-ARM 42X72 X-RAY (DRAPES) ×6 IMPLANT
DRAPE LAPAROTOMY 100X72 PEDS (DRAPES) ×3 IMPLANT
DRILL 14MM (INSTRUMENTS) ×3
DRSG OPSITE POSTOP 3X4 (GAUZE/BANDAGES/DRESSINGS) ×3 IMPLANT
DURAPREP 6ML APPLICATOR 50/CS (WOUND CARE) ×3 IMPLANT
ELECT COATED BLADE 2.86 ST (ELECTRODE) ×3 IMPLANT
ELECT REM PT RETURN 9FT ADLT (ELECTROSURGICAL) ×3
ELECTRODE REM PT RTRN 9FT ADLT (ELECTROSURGICAL) ×1 IMPLANT
GAUZE 4X4 16PLY RFD (DISPOSABLE) IMPLANT
GLOVE BIO SURGEON STRL SZ7 (GLOVE) IMPLANT
GLOVE BIO SURGEON STRL SZ8 (GLOVE) ×3 IMPLANT
GLOVE BIOGEL PI IND STRL 7.0 (GLOVE) IMPLANT
GLOVE BIOGEL PI INDICATOR 7.0 (GLOVE)
GOWN STRL REUS W/ TWL LRG LVL3 (GOWN DISPOSABLE) IMPLANT
GOWN STRL REUS W/ TWL XL LVL3 (GOWN DISPOSABLE) IMPLANT
GOWN STRL REUS W/TWL 2XL LVL3 (GOWN DISPOSABLE) ×3 IMPLANT
GOWN STRL REUS W/TWL LRG LVL3 (GOWN DISPOSABLE)
GOWN STRL REUS W/TWL XL LVL3 (GOWN DISPOSABLE)
HEMOSTAT POWDER KIT SURGIFOAM (HEMOSTASIS) ×3 IMPLANT
KIT BASIN OR (CUSTOM PROCEDURE TRAY) ×3 IMPLANT
KIT TURNOVER KIT B (KITS) ×3 IMPLANT
NEEDLE HYPO 25X1 1.5 SAFETY (NEEDLE) ×3 IMPLANT
NEEDLE SPNL 20GX3.5 QUINCKE YW (NEEDLE) ×3 IMPLANT
NS IRRIG 1000ML POUR BTL (IV SOLUTION) ×3 IMPLANT
OIL CARTRIDGE MAESTRO DRILL (MISCELLANEOUS) ×3
PACK LAMINECTOMY NEURO (CUSTOM PROCEDURE TRAY) ×3 IMPLANT
PAD ARMBOARD 7.5X6 YLW CONV (MISCELLANEOUS) ×3 IMPLANT
PIN DISTRACTION 14MM (PIN) ×6 IMPLANT
PLATE 14MM (Plate) ×3 IMPLANT
SCREW CANN 4X16 SS S/DRILL (Screw) ×12 IMPLANT
SPACER ASSEM CERV LORD 8M (Spacer) ×3 IMPLANT
SPONGE INTESTINAL PEANUT (DISPOSABLE) ×3 IMPLANT
SPONGE SURGIFOAM ABS GEL SZ50 (HEMOSTASIS) ×3 IMPLANT
STRIP CLOSURE SKIN 1/2X4 (GAUZE/BANDAGES/DRESSINGS) ×2 IMPLANT
SUT VIC AB 3-0 SH 8-18 (SUTURE) ×3 IMPLANT
SUT VICRYL 4-0 PS2 18IN ABS (SUTURE) IMPLANT
TOWEL GREEN STERILE (TOWEL DISPOSABLE) ×3 IMPLANT
TOWEL GREEN STERILE FF (TOWEL DISPOSABLE) ×3 IMPLANT
WATER STERILE IRR 1000ML POUR (IV SOLUTION) ×3 IMPLANT

## 2019-08-20 NOTE — Anesthesia Preprocedure Evaluation (Signed)
Anesthesia Evaluation  Patient identified by MRN, date of birth, ID band Patient awake    Reviewed: Allergy & Precautions, NPO status , Patient's Chart, lab work & pertinent test results  Airway Mallampati: II  TM Distance: >3 FB Neck ROM: Limited    Dental  (+) Teeth Intact, Dental Advisory Given   Pulmonary    breath sounds clear to auscultation       Cardiovascular  Rhythm:Regular Rate:Normal     Neuro/Psych    GI/Hepatic   Endo/Other    Renal/GU      Musculoskeletal   Abdominal   Peds  Hematology   Anesthesia Other Findings   Reproductive/Obstetrics                            Anesthesia Physical Anesthesia Plan  ASA: III  Anesthesia Plan: General   Post-op Pain Management:    Induction: Intravenous  PONV Risk Score and Plan: Ondansetron and Dexamethasone  Airway Management Planned: Oral ETT  Additional Equipment:   Intra-op Plan:   Post-operative Plan: Extubation in OR  Informed Consent: I have reviewed the patients History and Physical, chart, labs and discussed the procedure including the risks, benefits and alternatives for the proposed anesthesia with the patient or authorized representative who has indicated his/her understanding and acceptance.     Dental advisory given  Plan Discussed with: CRNA and Anesthesiologist  Anesthesia Plan Comments:         Anesthesia Quick Evaluation  

## 2019-08-20 NOTE — Anesthesia Procedure Notes (Signed)
Procedure Name: Intubation Date/Time: 08/20/2019 11:31 AM Performed by: Alain Marion, CRNA Pre-anesthesia Checklist: Patient identified, Emergency Drugs available, Suction available and Patient being monitored Patient Re-evaluated:Patient Re-evaluated prior to induction Oxygen Delivery Method: Circle System Utilized Preoxygenation: Pre-oxygenation with 100% oxygen Induction Type: IV induction Ventilation: Mask ventilation without difficulty, Oral airway inserted - appropriate to patient size and Two handed mask ventilation required Laryngoscope Size: Glidescope and 4 Grade View: Grade I Tube type: Oral Tube size: 7.5 mm Number of attempts: 1 Airway Equipment and Method: Stylet and Oral airway Placement Confirmation: ETT inserted through vocal cords under direct vision,  positive ETCO2 and breath sounds checked- equal and bilateral Secured at: 22 cm Tube secured with: Tape Dental Injury: Teeth and Oropharynx as per pre-operative assessment

## 2019-08-20 NOTE — Op Note (Signed)
08/20/2019  1:04 PM  PATIENT:  Edward Steele  64 y.o. male  PRE-OPERATIVE DIAGNOSIS: Cervical spondylosis with cervical spinal stenosis C3-4 with cervical spondylitic myelopathy  POST-OPERATIVE DIAGNOSIS:  same  PROCEDURE:  1. Decompressive anterior cervical discectomy C3-4, 2. Anterior cervical arthrodesis C3-4 utilizing a 8 mm cortical cancellus  bone graft, 3. Anterior cervical plating C3-4 utilizing a Alphatec plate  SURGEON:  Sherley Bounds, MD  ASSISTANTS: Dr. Marcello Moores  ANESTHESIA:   General  EBL: 50 ml  Total I/O In: 1300 [I.V.:1300] Out: 48 [Blood:50]  BLOOD ADMINISTERED: none  DRAINS: none  SPECIMEN:  none  INDICATION FOR PROCEDURE: This patient presented with difficulty with gait and numbness in his hands. Imaging showed spinal stenosis C3-4 with cord compression. The patient tried conservative measures without relief. Pain was debilitating. Recommended ACDF with plating. Patient understood the risks, benefits, and alternatives and potential outcomes and wished to proceed.  PROCEDURE DETAILS: Patient was brought to the operating room placed under general endotracheal anesthesia. Patient was placed in the supine position on the operating room table. The neck was prepped with Duraprep and draped in a sterile fashion.   Three cc of local anesthesia was injected and a transverse incision was made on the right side of the neck.  Dissection was carried down thru the subcutaneous tissue and the platysma was  elevated, opened, and undermined with Metzenbaum scissors.  Dissection was then carried out thru an avascular plane leaving the sternocleidomastoid carotid artery and jugular vein laterally and the trachea and esophagus medially with the assistance of my nurse practitioner. The ventral aspect of the vertebral column was identified and a localizing x-ray was taken. The C3-4 level was identified and all in the room agreed with the level. The longus colli muscles were then elevated and  the retractor was placed with the assistance of my nurse practitioner. The annulus was incised and the disc space entered. Discectomy was performed with micro-curettes and pituitary rongeurs. I then used the high-speed drill to drill the endplates down to the level of the posterior longitudinal ligament.. The operating microscope was draped and brought into the field provided additional magnification, illumination and visualization. Discectomy was continued posteriorly thru the disc space. Posterior longitudinal ligament was opened with a nerve hook, and then removed along with disc herniation and osteophytes, decompressing the spinal canal and thecal sac. We then continued to remove osteophytic overgrowth and disc material decompressing the neural foramina and exiting nerve roots bilaterally. The scope was angled up and down to help decompress and undercut the vertebral bodies. Once the decompression was completed we could pass a nerve hook circumferentially to assure adequate decompression in the midline and in the neural foramina. So by both visualization and palpation we felt we had an adequate decompression of the neural elements. We then measured the height of the intravertebral disc space and selected a 8 millimeter cortical cancellus allograft. It was then gently positioned in the intravertebral disc space and countersunk. I then used a 10 mm Alphatec plate and placed 14 mm variable angle screws into the vertebral bodies of each level and locked them into position. The wound was irrigated with bacitracin solution, checked for hemostasis which was established and confirmed. Once meticulous hemostasis was achieved, we then proceeded with closure with the assistance of my nurse practitioner. The platysma was closed with interrupted 3-0 undyed Vicryl suture, the subcuticular layer was closed with interrupted 3-0 undyed Vicryl suture. The skin edges were approximated with steristrips. The drapes were removed. A  sterile dressing was applied. The patient was then awakened from general anesthesia and transferred to the recovery room in stable condition. At the end of the procedure all sponge, needle and instrument counts were correct.   PLAN OF CARE: Admit for overnight observation  PATIENT DISPOSITION:  PACU - hemodynamically stable.   Delay start of Pharmacological VTE agent (>24hrs) due to surgical blood loss or risk of bleeding:  yes

## 2019-08-20 NOTE — Anesthesia Postprocedure Evaluation (Signed)
Anesthesia Post Note  Patient: [name]  Procedure(s) Performed: ACDF - C3-C4 (N/A Neck)     Patient location during evaluation: PACU Anesthesia Type: General Level of consciousness: awake and alert Pain management: pain level controlled Vital Signs Assessment: post-procedure vital signs reviewed and stable Respiratory status: spontaneous breathing, nonlabored ventilation, respiratory function stable and patient connected to nasal cannula oxygen Cardiovascular status: blood pressure returned to baseline and stable Postop Assessment: no apparent nausea or vomiting Anesthetic complications: no   No complications documented.  Last Vitals:  Vitals:   08/20/19 1500 08/20/19 1540  BP: 129/79 140/78  Pulse: 82 88  Resp: (!) 8 19  Temp: 36.5 C 36.8 C  SpO2: (!) 88% 94%    Last Pain:  Vitals:   08/20/19 1540  TempSrc: Oral  PainSc:                  Trevel Dillenbeck COKER

## 2019-08-20 NOTE — Transfer of Care (Signed)
Immediate Anesthesia Transfer of Care Note  Patient: Edward Steele  Procedure(s) Performed: ACDF - C3-C4 (N/A Neck)  Patient Location: PACU  Anesthesia Type:General  Level of Consciousness: awake, alert  and patient cooperative  Airway & Oxygen Therapy: Patient Spontanous Breathing and Patient connected to face mask oxygen  Post-op Assessment: Report given to RN and Post -op Vital signs reviewed and stable  Post vital signs: Reviewed and stable  Last Vitals:  Vitals Value Taken Time  BP 122/63 08/20/19 1315  Temp    Pulse 88 08/20/19 1315  Resp 13 08/20/19 1315  SpO2 96 % 08/20/19 1315  Vitals shown include unvalidated device data.  Last Pain:  Vitals:   08/20/19 0919  PainSc: 0-No pain         Complications: No complications documented.

## 2019-08-20 NOTE — Plan of Care (Signed)
Patient alert and oriented, mae's well, voiding adequate amount of urine, swallowing without difficulty, no c/o pain at time of discharge. Patient discharged home with family. Script and discharged instructions given to patient. Patient and family stated understanding of instructions given. Patient has an appointment with Dr. Jones °

## 2019-08-20 NOTE — Progress Notes (Signed)
Pharmacy Antibiotic Note  Edward Steele is a 64 y.o. male admitted on 08/20/2019 with cervical spondylosis with cervical spinal stenosis C3-4 with cervical spondylitic myelopathy.  Pt is S/P cervical decompression/discectomy this afternoon. Pharmacy has been consulted for vancomycin dosing for post op surgical prophylaxis.  Pt rec'd vancomycin 1 gm IV X 1 pre op at 1000 AM today; per surgeon note, pt has no drains in place post op  WBC 4.2, afebrile, Scr 1.29, CrCl 64.8 ml/min  Plan: Vancomycin 1 gm IV X 1 ~12 hrs after pre op dose (dose scheduled at 2200)  Height: 5\' 10"  (177.8 cm) Weight: 88.5 kg (195 lb) IBW/kg (Calculated) : 73  Temp (24hrs), Avg:97.9 F (36.6 C), Min:97.7 F (36.5 C), Max:98.3 F (36.8 C)  Recent Labs  Lab 08/20/19 0923  WBC 4.2    CrCl cannot be calculated (Patient's most recent lab result is older than the maximum 21 days allowed.).    Allergies  Allergen Reactions  . Penicillin G Hives    Childhood allergy Has patient had a PCN reaction causing immediate rash, facial/tongue/throat swelling, SOB or lightheadedness with hypotension: n/a Has patient had a PCN reaction causing severe rash involving mucus membranes or skin necrosis: n/a Has patient had a PCN reaction that required hospitalization: n/a Has patient had a PCN reaction occurring within the last 10 years:  n/a If all of the above answers are "NO", then may proceed with Cephalosporin use.    Microbiology results: 6/11 MRSA PCR: negative 6/10 COVID: negative  Thank you for allowing pharmacy to be a part of this patient's care.  Gillermina Hu, PharmD, BCPS, [facility] Clinical Pharmacist 08/20/2019 4:06 PM

## 2019-08-20 NOTE — H&P (Signed)
Subjective:   Patient is a 64 y.o. male admitted for cervical spondylitic myelopathy with progressive weakness and gait changes. The patient first presented to me with complaints of neck pain, numbness of the arm(s) and gait disturbance. Onset of symptoms was several weeks ago. The pain is described as aching and occurs intermittently. The pain is rated moderate, and is located in the neck and radiates to the shoulders. The symptoms have been progressive. Symptoms are exacerbated by extending head backwards, and are relieved by none.  Previous work up includes MRI of cervical spine, results: spinal stenosis.  Past Medical History:  Diagnosis Date  . Anemia    oral iron supplement used  . Anxiety   . Cancer ([place])    skin cancer - face basal and squamous  . Chronic back pain    lower back  . Constipation   . Depression   . GERD (gastroesophageal reflux disease)   . Head injury, closed, with concussion   . History of kidney stones    x 4 episodes-no recent issues  . Keratoconus    left eye  . Lymphoma ([place])    chemo  4 and 1/2 months last chemo Mar 01, 2016  . Personal history of chemotherapy 2017   lymphoma   . Pneumonia    age 30  . Recurrent sinus infections    past history-none recent " Inhalers were for this- no use im many yrs"    Past Surgical History:  Procedure Laterality Date  . ABDOMINAL WALL DEFECT REPAIR N/A 11/08/2015   Procedure: EXPLORE LOWER  ABDOMINAL WALL WITH BIOPSY;  Surgeon: Ralene Ok, MD;  Location: [facility];  Service: General;  Laterality: N/A;  . ANTERIOR FUSION CERVICAL SPINE     x 2  . BACK SURGERY     lower back with screws and rods  . BOWEL RESECTION N/A 10/17/2015   Procedure: EXPLORATORY LAPAROTOMY, SMALL BOWEL RESECTION, WITH ANASTAMOSIS;  Surgeon: Ralene Ok, MD;  Location: [facility];  Service: General;  Laterality: N/A;  . CATARACT EXTRACTION, BILATERAL Bilateral   . CYSTOSCOPY W/ URETERAL STENT PLACEMENT Left 08/01/2018   Procedure: CYSTOSCOPY  WITH RETROGRADE PYELOGRAM/URETERAL STENT PLACEMENT;  Surgeon: Franchot Gallo, MD;  Location: [facility];  Service: Urology;  Laterality: Left;  . EYE SURGERY Left    corneal tranplant  . LAPAROSCOPY N/A 10/17/2015   Procedure: LAPAROSCOPY DIAGNOSTIC;  Surgeon: Ralene Ok, MD;  Location: [facility];  Service: General;  Laterality: N/A;  . PORT-A-CATH REMOVAL Left 05/02/2016   Procedure: REMOVAL PORT-A-CATH;  Surgeon: Ralene Ok, MD;  Location: [facility];  Service: General;  Laterality: Left;  . PORTACATH PLACEMENT N/A 11/08/2015   Procedure: INSERTION PORT-A-CATH;  Surgeon: Ralene Ok, MD;  Location: [facility];  Service: General;  Laterality: N/A;    Allergies  Allergen Reactions  . Penicillin G Hives    Childhood allergy Has patient had a PCN reaction causing immediate rash, facial/tongue/throat swelling, SOB or lightheadedness with hypotension: n/a Has patient had a PCN reaction causing severe rash involving mucus membranes or skin necrosis: n/a Has patient had a PCN reaction that required hospitalization: n/a Has patient had a PCN reaction occurring within the last 10 years:  n/a If all of the above answers are "NO", then may proceed with Cephalosporin use.    Social History   Tobacco Use  . Smoking status: Never Smoker  . Smokeless tobacco: Never Used  Substance Use Topics  . Alcohol use: No    Family History  Problem  Relation Age of Onset  . Breast cancer Sister    Prior to Admission medications   Medication Sig Start Date End Date Taking? Authorizing Provider  acetaminophen (TYLENOL) 325 MG tablet Take 650 mg by mouth every 6 (six) hours as needed for mild pain.   Yes [provider]  clonazePAM (KLONOPIN) 1 MG tablet Take 1 mg by mouth 2 (two) times daily. 09/18/15  Yes [provider]  FLUoxetine (PROZAC) 20 MG tablet Take 40 mg by mouth 2 (two) times daily. 07/28/15  Yes [provider]  gabapentin (NEURONTIN) 300 MG capsule Take 1 capsule (300 mg  total) by mouth at bedtime as needed (nerve pain). Patient taking differently: Take 300 mg by mouth 2 (two) times daily.  04/21/17  Yes Nicholas Lose, MD  meloxicam (MOBIC) 15 MG tablet Take 15 mg by mouth daily. 07/11/19  Yes [provider]  meloxicam (MOBIC) 7.5 MG tablet Take 1 tablet (7.5 mg total) by mouth daily. Patient taking differently: Take 15 mg by mouth daily as needed for pain.  04/21/17  Yes Nicholas Lose, MD  Multiple Vitamins-Minerals (MULTIVITAMIN WITH MINERALS) tablet Take 1 tablet by mouth daily.   Yes [provider]  pantoprazole (PROTONIX) 40 MG tablet Take 40 mg by mouth daily. 06/04/19  Yes [provider]  rosuvastatin (CRESTOR) 5 MG tablet Take 5 mg by mouth at bedtime.  08/28/15  Yes [provider]  Vitamin D3 (VITAMIN D) 25 MCG tablet Take 1,000 Units by mouth daily.   Yes [provider]  zolpidem (AMBIEN) 10 MG tablet Take 10 mg by mouth at bedtime as needed for sleep. 02/16/19  Yes [provider]  albuterol (PROVENTIL HFA;VENTOLIN HFA) 108 (90 Base) MCG/ACT inhaler Inhale 2 puffs into the lungs every 6 (six) hours as needed for wheezing or shortness of breath.     [provider]     Review of Systems  Positive ROS: neg  All other systems have been reviewed and were otherwise negative with the exception of those mentioned in the HPI and as above.  Objective: Vital signs in last 24 hours: Temp:  [97.9 F (36.6 C)] 97.9 F (36.6 C) (06/11 0858) Pulse Rate:  [68] 68 (06/11 0858) Resp:  [17] 17 (06/11 0858) BP: (138)/(86) 138/86 (06/11 0858) SpO2:  [98 %] 98 % (06/11 0858) Weight:  [88.5 kg] 88.5 kg (06/11 0858)  General Appearance: Alert, cooperative, no distress, appears stated age Head: Normocephalic, without obvious abnormality, atraumatic Eyes: PERRL, conjunctiva/corneas clear, EOM's intact      Neck: Supple, symmetrical, trachea midline, Back: Symmetric, no curvature, ROM normal, no CVA  tenderness Lungs:  respirations unlabored Heart: Regular rate and rhythm Abdomen: Soft, non-tender Extremities: Extremities normal, atraumatic, no cyanosis or edema Pulses: 2+ and symmetric all extremities Skin: Skin color, texture, turgor normal, no rashes or lesions  NEUROLOGIC:  Mental status: Alert and oriented x4, no aphasia, good attention span, fund of knowledge and memory  Motor Exam - grossly normal Sensory Exam - grossly normal Reflexes: 3+ Coordination - grossly normal Gait -not tested Balance -not tested Cranial Nerves: I: smell Not tested  II: visual acuity  OS: nl    OD: nl  II: visual fields Full to confrontation  II: pupils Equal, round, reactive to light  III,VII: ptosis None  III,IV,VI: extraocular muscles  Full ROM  V: mastication Normal  V: facial light touch sensation  Normal  V,VII: corneal reflex  Present  VII: facial muscle function - upper  Normal  VII: facial muscle function - lower Normal  VIII: hearing Not tested  IX: soft palate elevation  Normal  IX,X: gag reflex Present  XI: trapezius strength  5/5  XI: sternocleidomastoid strength 5/5  XI: neck flexion strength  5/5  XII: tongue strength  Normal    Data Review Lab Results  Component Value Date   WBC 4.2 08/20/2019   HGB 12.8 (L) 08/20/2019   HCT 38.2 (L) 08/20/2019   MCV 91.8 08/20/2019   PLT 149 (L) 08/20/2019   Lab Results  Component Value Date   NA 136 10/29/2018   K 4.3 10/29/2018   CL 104 10/29/2018   CO2 24 10/29/2018   BUN 35 (H) 10/29/2018   CREATININE 1.21 10/29/2018   GLUCOSE 90 10/29/2018   Lab Results  Component Value Date   INR 1.03 10/31/2015    Assessment:   Cervical neck pain with herniated nucleus pulposus/ spondylosis/ stenosis at C3-4. Estimated body mass index is 27.98 kg/m as calculated from the following:   Height as of this encounter: 5\' 10"  (1.778 m).   Weight as of this encounter: 88.5 kg.  Patient has failed conservative therapy. Planned  surgery : ACDF with plate C3-4  Plan:   I explained the condition and procedure to the patient and answered any questions.  Patient wishes to proceed with procedure as planned. Understands risks/ benefits/ and expected or typical outcomes.  Eustace Moore 08/20/2019 10:35 AM

## 2019-08-23 ENCOUNTER — Encounter (HOSPITAL_COMMUNITY): Payer: Self-pay | Admitting: Neurological Surgery

## 2019-08-23 NOTE — Discharge Summary (Signed)
Physician Discharge Summary  Patient ID: Edward Steele MRN: [id number] DOB/AGE: Jul 24, 1955 64 y.o.  Admit date: 08/20/2019 Discharge date: 08/23/2019  Admission Diagnoses: Cervical spondylitic myelopathy with spinal stenosis C3-4    Discharge Diagnoses: Same   Discharged Condition: good  Hospital Course: The patient was admitted on 08/20/2019 and taken to the operating room where the patient underwent ACDF C3-4. The patient tolerated the procedure well and was taken to the recovery room and then to the floor in stable condition. The hospital course was routine. There were no complications. The wound remained clean dry and intact. Pt had appropriate neck soreness. No complaints of arm pain or new N/T/W. The patient remained afebrile with stable vital signs, and tolerated a regular diet. The patient continued to increase activities, and pain was well controlled with oral pain medications.   Consults: None  Significant Diagnostic Studies:  Results for orders placed or performed during the hospital encounter of 08/20/19  Surgical pcr screen   Specimen: Nasal Mucosa; Nasal Swab  Result Value Ref Range   MRSA, PCR NEGATIVE NEGATIVE   Staphylococcus aureus NEGATIVE NEGATIVE  CBC  Result Value Ref Range   WBC 4.2 4.0 - 10.5 K/uL   RBC 4.16 (L) 4.22 - 5.81 MIL/uL   Hemoglobin 12.8 (L) 13.0 - 17.0 g/dL   HCT 38.2 (L) 39 - 52 %   MCV 91.8 80.0 - 100.0 fL   MCH 30.8 26.0 - 34.0 pg   MCHC 33.5 30.0 - 36.0 g/dL   RDW 13.2 11.5 - 15.5 %   Platelets 149 (L) 150 - 400 K/uL   nRBC 0.0 0.0 - 0.2 %  Creatinine, serum  Result Value Ref Range   Creatinine, Ser 1.29 (H) 0.61 - 1.24 mg/dL   GFR calc non Af Amer 58 (L) >60 mL/min   GFR calc Af Amer >60 >60 mL/min    Chest 2 View  Result Date: 08/20/2019 CLINICAL DATA:  Pre-op for cervical fusion.  Cervical myelopathy. EXAM: CHEST - 2 VIEW COMPARISON:  Radiographs 08/01/2018.  CT 05/05/2018. FINDINGS: The heart size and mediastinal contours are  normal. The lungs are clear. There is no pleural effusion or pneumothorax. No acute osseous findings are identified. Postsurgical changes are present in the lower cervical spine. There are mild degenerative changes throughout the thoracic spine. IMPRESSION: No active cardiopulmonary process. Electronically Signed   By: Richardean Sale M.D.   On: 08/20/2019 09:32   DG Cervical Spine 1 View  Result Date: 08/20/2019 CLINICAL DATA:  C3-4 ACDF. EXAM: DG CERVICAL SPINE - 1 VIEW; DG C-ARM 1-60 MIN COMPARISON:  CT 07/20/2019 FINDINGS: Single lateral fluoroscopic spot view of the cervical spine obtained in the operating room. Anterior C3-C4 fusion is new from prior exam. Additional cervical hardware is partially included, grossly unchanged. Total fluoroscopy time 5 seconds. Total dose 1.09 mGy. IMPRESSION: Intraoperative after during C3-C4 fusion. Electronically Signed   By: Keith Rake M.D.   On: 08/20/2019 15:07   DG C-Arm 1-60 Min  Result Date: 08/20/2019 CLINICAL DATA:  C3-4 ACDF. EXAM: DG CERVICAL SPINE - 1 VIEW; DG C-ARM 1-60 MIN COMPARISON:  CT 07/20/2019 FINDINGS: Single lateral fluoroscopic spot view of the cervical spine obtained in the operating room. Anterior C3-C4 fusion is new from prior exam. Additional cervical hardware is partially included, grossly unchanged. Total fluoroscopy time 5 seconds. Total dose 1.09 mGy. IMPRESSION: Intraoperative after during C3-C4 fusion. Electronically Signed   By: Keith Rake M.D.   On: 08/20/2019 15:07    Antibiotics:  Anti-infectives (From admission, onward)   Start     Dose/Rate Route Frequency Ordered Stop   08/20/19 2200  vancomycin (VANCOCIN) IVPB 1000 mg/200 mL premix  Status:  Discontinued        1,000 mg 200 mL/hr over 60 Minutes Intravenous  Once 08/20/19 1816 08/21/19 0040   08/20/19 1140  bacitracin 50,000 Units in sodium chloride 0.9 % 500 mL irrigation  Status:  Discontinued          As needed 08/20/19 1140 08/20/19 1309   08/20/19  0900  vancomycin (VANCOCIN) IVPB 1000 mg/200 mL premix        1,000 mg 200 mL/hr over 60 Minutes Intravenous On call to O.R. 08/20/19 0846 08/20/19 1100      Discharge Exam: Blood pressure 140/78, pulse 88, temperature 98.3 F (36.8 C), temperature source Oral, resp. rate 19, height 5\' 10"  (1.778 m), weight 88.5 kg, SpO2 94 %. Neurologic: Grossly normal Dressing dry  Discharge Medications:   Allergies as of 08/20/2019      Reactions   Penicillin G Hives   Childhood allergy Has patient had a PCN reaction causing immediate rash, facial/tongue/throat swelling, SOB or lightheadedness with hypotension: n/a Has patient had a PCN reaction causing severe rash involving mucus membranes or skin necrosis: n/a Has patient had a PCN reaction that required hospitalization: n/a Has patient had a PCN reaction occurring within the last 10 years:  n/a If all of the above answers are "NO", then may proceed with Cephalosporin use.      Medication List    TAKE these medications   acetaminophen 325 MG tablet Commonly known as: TYLENOL Take 650 mg by mouth every 6 (six) hours as needed for mild pain.   albuterol 108 (90 Base) MCG/ACT inhaler Commonly known as: VENTOLIN HFA Inhale 2 puffs into the lungs every 6 (six) hours as needed for wheezing or shortness of breath.   clonazePAM 1 MG tablet Commonly known as: KLONOPIN Take 1 mg by mouth 2 (two) times daily.   FLUoxetine 20 MG tablet Commonly known as: PROZAC Take 40 mg by mouth 2 (two) times daily.   gabapentin 300 MG capsule Commonly known as: NEURONTIN Take 1 capsule (300 mg total) by mouth at bedtime as needed (nerve pain). What changed: when to take this   meloxicam 7.5 MG tablet Commonly known as: Mobic Take 1 tablet (7.5 mg total) by mouth daily. What changed:   how much to take  when to take this  reasons to take this   meloxicam 15 MG tablet Commonly known as: MOBIC Take 15 mg by mouth daily. What changed: Another  medication with the same name was changed. Make sure you understand how and when to take each.   multivitamin with minerals tablet Take 1 tablet by mouth daily.   oxyCODONE 5 MG immediate release tablet Commonly known as: Oxy IR/ROXICODONE Take 1 tablet (5 mg total) by mouth every 4 (four) hours as needed for moderate pain ((score 4 to 6)).   pantoprazole 40 MG tablet Commonly known as: PROTONIX Take 40 mg by mouth daily.   rosuvastatin 5 MG tablet Commonly known as: CRESTOR Take 5 mg by mouth at bedtime.   Vitamin D3 25 MCG tablet Commonly known as: Vitamin D Take 1,000 Units by mouth daily.   zolpidem 10 MG tablet Commonly known as: AMBIEN Take 10 mg by mouth at bedtime as needed for sleep.       Disposition: Home   Final Dx: ACDF C3-4  Discharge Instructions     Remove dressing in 72 hours   Complete by: As directed    Call MD for:  difficulty breathing, headache or visual disturbances   Complete by: As directed    Call MD for:  persistant nausea and vomiting   Complete by: As directed    Call MD for:  redness, tenderness, or signs of infection (pain, swelling, redness, odor or green/yellow discharge around incision site)   Complete by: As directed    Call MD for:  severe uncontrolled pain   Complete by: As directed    Call MD for:  temperature >100.4   Complete by: As directed    Diet - low sodium heart healthy   Complete by: As directed    Increase activity slowly   Complete by: As directed        Follow-up Information    Eustace Moore, MD Follow up.   Specialty: Neurosurgery Contact information: [address] [place] [address] [place] [address] [phone]                Signed: Eustace Moore 08/23/2019, 10:59 AM

## 2019-10-05 DIAGNOSIS — G959 Disease of spinal cord, unspecified: Secondary | ICD-10-CM | POA: Diagnosis not present

## 2019-10-13 ENCOUNTER — Telehealth: Payer: Self-pay | Admitting: *Deleted

## 2019-10-13 DIAGNOSIS — C8339 Diffuse large B-cell lymphoma, extranodal and solid organ sites: Secondary | ICD-10-CM

## 2019-10-13 NOTE — Telephone Encounter (Signed)
Pt called to make an appt with Dr Lindi Adie. He is wanting to know if he needs a scan as well.   Message to scheduler to make appt with Dr Lindi Adie with labs

## 2019-10-13 NOTE — Telephone Encounter (Signed)
Please order CT chest abdomen pelvis with contrast, labs to be done prior to doing the CT scan with CBC differential, LDH and a CMP. Follow-up after the scans

## 2019-10-14 ENCOUNTER — Telehealth: Payer: Self-pay | Admitting: Hematology and Oncology

## 2019-10-14 NOTE — Telephone Encounter (Signed)
Scheduled appt per 8/4 sch msg - pt is aware.

## 2019-10-25 ENCOUNTER — Other Ambulatory Visit: Payer: Self-pay | Admitting: Hematology and Oncology

## 2019-10-25 ENCOUNTER — Inpatient Hospital Stay: Payer: BC Managed Care – PPO

## 2019-10-25 ENCOUNTER — Ambulatory Visit (HOSPITAL_COMMUNITY): Admission: RE | Admit: 2019-10-25 | Payer: BC Managed Care – PPO | Source: Ambulatory Visit

## 2019-10-28 ENCOUNTER — Ambulatory Visit: Payer: BC Managed Care – PPO | Admitting: Hematology and Oncology

## 2019-11-02 ENCOUNTER — Inpatient Hospital Stay: Payer: BC Managed Care – PPO | Attending: Hematology and Oncology

## 2019-11-02 ENCOUNTER — Other Ambulatory Visit: Payer: Self-pay

## 2019-11-02 ENCOUNTER — Encounter (HOSPITAL_COMMUNITY): Payer: Self-pay

## 2019-11-02 ENCOUNTER — Ambulatory Visit (HOSPITAL_COMMUNITY)
Admission: RE | Admit: 2019-11-02 | Discharge: 2019-11-02 | Disposition: A | Discharge status: Home / Self Care | Payer: BC Managed Care – PPO | Source: Ambulatory Visit | Attending: Hematology and Oncology | Admitting: Hematology and Oncology

## 2019-11-02 DIAGNOSIS — C8339 Diffuse large B-cell lymphoma, extranodal and solid organ sites: Secondary | ICD-10-CM | POA: Diagnosis not present

## 2019-11-02 DIAGNOSIS — Z8572 Personal history of non-Hodgkin lymphomas: Secondary | ICD-10-CM | POA: Diagnosis not present

## 2019-11-02 DIAGNOSIS — C8590 Non-Hodgkin lymphoma, unspecified, unspecified site: Secondary | ICD-10-CM | POA: Diagnosis not present

## 2019-11-02 LAB — CMP (CANCER CENTER ONLY)
ALT: 26 U/L (ref 0–44)
AST: 30 U/L (ref 15–41)
Albumin: 3.8 g/dL (ref 3.5–5.0)
Alkaline Phosphatase: 68 U/L (ref 38–126)
Anion gap: 9 (ref 5–15)
BUN: 17 mg/dL (ref 8–23)
CO2: 25 mmol/L (ref 22–32)
Calcium: 9.7 mg/dL (ref 8.9–10.3)
Chloride: 104 mmol/L (ref 98–111)
Creatinine: 1.05 mg/dL (ref 0.61–1.24)
GFR, Est AFR Am: >60 mL/min (ref >60)
GFR, Estimated: >60 mL/min (ref >60)
Glucose, Bld: 100 mg/dL — ABNORMAL HIGH (ref 70–99)
Potassium: 4.4 mmol/L (ref 3.5–5.1)
Sodium: 138 mmol/L (ref 135–145)
Total Bilirubin: 0.6 mg/dL (ref 0.3–1.2)
Total Protein: 8.1 g/dL (ref 6.5–8.1)

## 2019-11-02 LAB — CBC WITH DIFFERENTIAL (CANCER CENTER ONLY)
Abs Immature Granulocytes: 0.01 10*3/uL (ref 0.00–0.07)
Basophils Absolute: 0 10*3/uL (ref 0.0–0.1)
Basophils Relative: 1 %
Eosinophils Absolute: 0.1 10*3/uL (ref 0.0–0.5)
Eosinophils Relative: 2 %
HCT: 39.8 % (ref 39.0–52.0)
Hemoglobin: 13.7 g/dL (ref 13.0–17.0)
Immature Granulocytes: 0 %
Lymphocytes Relative: 37 %
Lymphs Abs: 1.6 10*3/uL (ref 0.7–4.0)
MCH: 31.5 pg (ref 26.0–34.0)
MCHC: 34.4 g/dL (ref 30.0–36.0)
MCV: 91.5 fL (ref 80.0–100.0)
Monocytes Absolute: 0.3 10*3/uL (ref 0.1–1.0)
Monocytes Relative: 8 %
Neutro Abs: 2.3 10*3/uL (ref 1.7–7.7)
Neutrophils Relative %: 52 %
Platelet Count: 188 10*3/uL (ref 150–400)
RBC: 4.35 MIL/uL (ref 4.22–5.81)
RDW: 12.7 % (ref 11.5–15.5)
WBC Count: 4.3 10*3/uL (ref 4.0–10.5)
nRBC: 0 % (ref 0.0–0.2)

## 2019-11-02 LAB — LACTATE DEHYDROGENASE: LDH: 164 U/L (ref 98–192)

## 2019-11-02 MED ORDER — IOHEXOL 300 MG/ML  SOLN
100.0000 mL | Freq: Once | INTRAMUSCULAR | Status: AC | PRN
  Administered 2019-11-02: 100 mL via INTRAVENOUS

## 2019-11-02 MED ORDER — SODIUM CHLORIDE (PF) 0.9 % IJ SOLN
INTRAMUSCULAR | Status: AC
  Filled 2019-11-02: qty 50

## 2019-11-02 NOTE — Progress Notes (Signed)
Patient Care Team: Orpah Melter, MD as PCP - General (Family Medicine) Clydene Fake, MD as Referring Physician (Hematology and Oncology)  DIAGNOSIS:    ICD-10-CM   1. Diffuse large B-cell lymphoma of extranodal site ([place])  C83.39     SUMMARY OF ONCOLOGIC HISTORY: Oncology History  Diffuse large B-cell lymphoma of extranodal site ([place])  10/17/2015 Initial Diagnosis   Small intestine resection, proximal ileum: Involvement by diffuse large B-cell lymphoma,. Biopsy of peritoneal implant: Invol by a DLBCL.positive for CD20, CD79a,CD10, bcl-6, and bcl-2. CD3 and CD5 highlight scattered admixed T-cells. CD34 and TdT neg   10/31/2015 Pathology Results   Bone marrow biopsy: Hypercellular bone marrow for age 86-60% with trilineage hematopoiesis, plasma cells 9% with Kappa Light chain excess   11/02/2015 PET scan   Nodular thickening within the mesenteries of small bowel 17 mm concerning for peritoneal implant of high-grade lymphoma, activity along the abdominal wound with inflammation suspicion for infection/abscess/fistula of bladder   11/02/2015 Imaging   Ultrasound scrotum: Normal testes, no epididymitis, no varicocele or hydrocele   11/17/2015 - 03/01/2016 Chemotherapy   R CHOP 6 cycles   04/15/2016 PET scan   No residual abnormal hypermetabolic activity to suggest any residual lymphoma. Prior mesenteric activity has resolved. Metabolic activity left central prostate gland. Retracted left testicle      CHIEF COMPLIANT: Follow-up of diffuse large B-cell lymphoma  INTERVAL HISTORY: Edward Steele is a 64 y.o. with above-mentioned history of small bowel diffuse large B-cell lymphoma who wasinitiallytreated with surgery to resect the lymphoma, which wasfollowed by 6 cycles of R-CHOP chemotherapy. CT CAP on 11/02/19 showed no evidence of lymphoma labs on 11/02/19 showed: Hg 13.7, HCT 39.8, WBC 4.3, platelets 188, LDH 164.  He presents to the clinic today for follow-up.   Since his neck  surgery he is doing remarkably better.  He also had weakness in his lower legs and hip pain which have all improved since her neck surgery.  ALLERGIES:  is allergic to penicillin g.  MEDICATIONS:  Current Outpatient Medications  Medication Sig Dispense Refill  . acetaminophen (TYLENOL) 325 MG tablet Take 650 mg by mouth every 6 (six) hours as needed for mild pain.    Marland Kitchen albuterol (PROVENTIL HFA;VENTOLIN HFA) 108 (90 Base) MCG/ACT inhaler Inhale 2 puffs into the lungs every 6 (six) hours as needed for wheezing or shortness of breath.     . clonazePAM (KLONOPIN) 1 MG tablet Take 1 mg by mouth 2 (two) times daily.    Marland Kitchen FLUoxetine (PROZAC) 20 MG tablet Take 40 mg by mouth 2 (two) times daily.    Marland Kitchen gabapentin (NEURONTIN) 300 MG capsule Take 1 capsule (300 mg total) by mouth at bedtime as needed (nerve pain). (Patient taking differently: Take 300 mg by mouth 2 (two) times daily. ) 30 capsule 0  . meloxicam (MOBIC) 15 MG tablet Take 15 mg by mouth daily.    . meloxicam (MOBIC) 7.5 MG tablet Take 1 tablet (7.5 mg total) by mouth daily. (Patient taking differently: Take 15 mg by mouth daily as needed for pain. )    . Multiple Vitamins-Minerals (MULTIVITAMIN WITH MINERALS) tablet Take 1 tablet by mouth daily.    Marland Kitchen oxyCODONE (OXY IR/ROXICODONE) 5 MG immediate release tablet Take 1 tablet (5 mg total) by mouth every 4 (four) hours as needed for moderate pain ((score 4 to 6)). 30 tablet 0  . pantoprazole (PROTONIX) 40 MG tablet Take 40 mg by mouth daily.    . rosuvastatin (CRESTOR)  5 MG tablet Take 5 mg by mouth at bedtime.     . Vitamin D3 (VITAMIN D) 25 MCG tablet Take 1,000 Units by mouth daily.    Marland Kitchen zolpidem (AMBIEN) 10 MG tablet Take 10 mg by mouth at bedtime as needed for sleep.     No current facility-administered medications for this visit.    PHYSICAL EXAMINATION: ECOG PERFORMANCE STATUS: 1 - Symptomatic but completely ambulatory  Vitals:   11/03/19 1403  BP: 120/87  Pulse: 80  Resp: 17    Temp: 97.7 F (36.5 C)  SpO2: 98%   Filed Weights   11/03/19 1403  Weight: 195 lb 9.6 oz (88.7 kg)    LABORATORY DATA:  I have reviewed the data as listed CMP Latest Ref Rng & Units 11/02/2019 08/20/2019 10/29/2018  Glucose 70 - 99 mg/dL 100(H) - 90  BUN 8 - 23 mg/dL 17 - 35(H)  Creatinine 0.61 - 1.24 mg/dL 1.05 1.29(H) 1.21  Sodium 135 - 145 mmol/L 138 - 136  Potassium 3.5 - 5.1 mmol/L 4.4 - 4.3  Chloride 98 - 111 mmol/L 104 - 104  CO2 22 - 32 mmol/L 25 - 24  Calcium 8.9 - 10.3 mg/dL 9.7 - 8.9  Total Protein 6.5 - 8.1 g/dL 8.1 - 8.2(H)  Total Bilirubin 0.3 - 1.2 mg/dL 0.6 - 0.4  Alkaline Phos 38 - 126 U/L 68 - 75  AST 15 - 41 U/L 30 - 31  ALT 0 - 44 U/L 26 - 27    Lab Results  Component Value Date   WBC 4.3 11/02/2019   HGB 13.7 11/02/2019   HCT 39.8 11/02/2019   MCV 91.5 11/02/2019   PLT 188 11/02/2019   NEUTROABS 2.3 11/02/2019    ASSESSMENT & PLAN:  Diffuse large B-cell lymphoma of extranodal site [facility]) Small intestine resection, proximal ileum08/10/2015: Involvement by diffuse large B-cell lymphoma,. Biopsy of peritoneal implant: Invol by a DLBCL.positive for CD20, CD79a,CD10, bcl-6, and bcl-2. CD3 and CD5 highlight scattered admixed T-cells. CD34 and TdT neg  PET/CT 11/02/2015: Nodular thickening within the mesenteries of small bowel 17 mm concerning for peritoneal implant of high-grade lymphoma, activity along the abdominal wound with inflammation suspicion for infection/abscess/fistula of bladder Testicular ultrasound: Normal  Referredto [facility] for evaluation and for a second opinion reg the M protein and lytic lesions: They agreed that he does not have myeloma  ----------------------------------------------------------------------------------------------------------------- DLBCL: Prognosis: Revised IPI score: 1 (4 year disease free survival rate: 80%, estimated overall survival 79%) Treatment summary: R-CHOP chemotherapy 6 cycles started  09/08/2017completed 03/01/2016 CT chest abdomen pelvis2/20/2020: No evidence of lymphoma recurrence Plan to perform another CT scan in February and follow-up after that.  Bone marrow biopsy08/20/2017: Hypercellular bone marrow for age 31-60% with trilineage hematopoiesis, plasma cells 9% with Kappa Light chain excess, SPEP 1.5 gm M-Protein Monoclonal paraproteinemia: Bone survey showed bone lytic lesions.Elevated M protein MRI Rt Hip: Negative 10/29/2018: M protein 1.5 g, kappa 19.3 previously was 15.2 no evidence of any significant progression of paraproteinemia. 11/02/2019: SPEP: Pending, hemoglobin 13.7, platelets 188, WBC 4.3, CMP normal limits, LDH 164  CT CAP 11/02/2019: No evidence of lymphadenopathy in chest abdomen or pelvis, benign focal nodular hyperplasia right lobe of liver I will call the patient with the results of serum protein electrophoresis.  Return to clinic in 1 year with labs and follow-up.    No orders of the defined types were placed in this encounter.  The patient has a good understanding of the overall plan. he agrees with  it. he will call with any problems that may develop before the next visit here.  Total time spent: 20 mins including face to face time and time spent for planning, charting and coordination of care  Nicholas Lose, MD 11/03/2019  I, Cloyde Reams Dorshimer, am acting as scribe for Dr. Nicholas Lose.  I have reviewed the above documentation for accuracy and completeness, and I agree with the above.

## 2019-11-03 ENCOUNTER — Inpatient Hospital Stay (HOSPITAL_BASED_OUTPATIENT_CLINIC_OR_DEPARTMENT_OTHER): Payer: BC Managed Care – PPO | Admitting: Hematology and Oncology

## 2019-11-03 ENCOUNTER — Telehealth: Payer: Self-pay | Admitting: Hematology and Oncology

## 2019-11-03 ENCOUNTER — Other Ambulatory Visit: Payer: Self-pay

## 2019-11-03 DIAGNOSIS — Z8572 Personal history of non-Hodgkin lymphomas: Secondary | ICD-10-CM | POA: Diagnosis not present

## 2019-11-03 DIAGNOSIS — C8339 Diffuse large B-cell lymphoma, extranodal and solid organ sites: Secondary | ICD-10-CM

## 2019-11-03 LAB — KAPPA/LAMBDA LIGHT CHAINS
Kappa free light chain: 18.4 mg/L (ref 3.3–19.4)
Kappa, lambda light chain ratio: 1.84 — ABNORMAL HIGH (ref 0.26–1.65)
Lambda free light chains: 10 mg/L (ref 5.7–26.3)

## 2019-11-03 NOTE — Telephone Encounter (Signed)
Scheduled per los. Gave avs and calendar  

## 2019-11-03 NOTE — Assessment & Plan Note (Signed)
Small intestine resection, proximal ileum08/10/2015: Involvement by diffuse large B-cell lymphoma,. Biopsy of peritoneal implant: Invol by a DLBCL.positive for CD20, CD79a,CD10, bcl-6, and bcl-2. CD3 and CD5 highlight scattered admixed T-cells. CD34 and TdT neg  PET/CT 11/02/2015: Nodular thickening within the mesenteries of small bowel 17 mm concerning for peritoneal implant of high-grade lymphoma, activity along the abdominal wound with inflammation suspicion for infection/abscess/fistula of bladder Testicular ultrasound: Normal  Referredto [facility] for evaluation and for a second opinion reg the M protein and lytic lesions: They agreed that he does not have myeloma  ----------------------------------------------------------------------------------------------------------------- DLBCL: Prognosis: Revised IPI score: 1 (4 year disease free survival rate: 80%, estimated overall survival 79%) Treatment summary: R-CHOP chemotherapy 6 cycles started 09/08/2017completed 03/01/2016 CT chest abdomen pelvis2/20/2020: No evidence of lymphoma recurrence Plan to perform another CT scan in February and follow-up after that.  Bone marrow biopsy08/20/2017: Hypercellular bone marrow for age 99-60% with trilineage hematopoiesis, plasma cells 9% with Kappa Light chain excess, SPEP 1.5 gm M-Protein Monoclonal paraproteinemia: Bone survey showed bone lytic lesions.Elevated M protein MRI Rt Hip: Negative 10/29/2018: M protein 1.5 g, kappa 19.3 previously was 15.2 no evidence of any significant progression of paraproteinemia. 11/02/2019: SPEP: Pending, hemoglobin 13.7, platelets 188, WBC 4.3, CMP normal limits, LDH 164  CT CAP 11/02/2019: No evidence of lymphadenopathy in chest abdomen or pelvis, benign focal nodular hyperplasia right lobe of liver  Return to clinic in 1 year with labs and follow-up.

## 2019-11-04 LAB — MULTIPLE MYELOMA PANEL, SERUM
Albumin SerPl Elph-Mcnc: 3.7 g/dL (ref 2.9–4.4)
Albumin/Glob SerPl: 1 (ref 0.7–1.7)
Alpha 1: 0.3 g/dL (ref 0.0–0.4)
Alpha2 Glob SerPl Elph-Mcnc: 0.7 g/dL (ref 0.4–1.0)
B-Globulin SerPl Elph-Mcnc: 1 g/dL (ref 0.7–1.3)
Gamma Glob SerPl Elph-Mcnc: 1.9 g/dL — ABNORMAL HIGH (ref 0.4–1.8)
Globulin, Total: 3.9 g/dL (ref 2.2–3.9)
IgA: 98 mg/dL (ref 61–437)
IgG (Immunoglobin G), Serum: 2020 mg/dL — ABNORMAL HIGH (ref 603–1613)
IgM (Immunoglobulin M), Srm: 80 mg/dL (ref 20–172)
M Protein SerPl Elph-Mcnc: 1.6 g/dL — ABNORMAL HIGH
Total Protein ELP: 7.6 g/dL (ref 6.0–8.5)

## 2019-11-26 DIAGNOSIS — Z23 Encounter for immunization: Secondary | ICD-10-CM | POA: Diagnosis not present

## 2019-12-07 DIAGNOSIS — M542 Cervicalgia: Secondary | ICD-10-CM | POA: Diagnosis not present

## 2019-12-27 DIAGNOSIS — Z23 Encounter for immunization: Secondary | ICD-10-CM | POA: Diagnosis not present

## 2020-01-26 ENCOUNTER — Encounter (HOSPITAL_COMMUNITY): Payer: Self-pay | Admitting: Emergency Medicine

## 2020-01-26 ENCOUNTER — Other Ambulatory Visit: Payer: Self-pay

## 2020-01-26 DIAGNOSIS — S61210A Laceration without foreign body of right index finger without damage to nail, initial encounter: Secondary | ICD-10-CM | POA: Insufficient documentation

## 2020-01-26 DIAGNOSIS — Z8572 Personal history of non-Hodgkin lymphomas: Secondary | ICD-10-CM | POA: Insufficient documentation

## 2020-01-26 DIAGNOSIS — Z85828 Personal history of other malignant neoplasm of skin: Secondary | ICD-10-CM | POA: Insufficient documentation

## 2020-01-26 DIAGNOSIS — Z9221 Personal history of antineoplastic chemotherapy: Secondary | ICD-10-CM | POA: Insufficient documentation

## 2020-01-26 DIAGNOSIS — Y9389 Activity, other specified: Secondary | ICD-10-CM | POA: Insufficient documentation

## 2020-01-26 DIAGNOSIS — S6991XA Unspecified injury of right wrist, hand and finger(s), initial encounter: Secondary | ICD-10-CM | POA: Diagnosis present

## 2020-01-26 DIAGNOSIS — W260XXA Contact with knife, initial encounter: Secondary | ICD-10-CM | POA: Insufficient documentation

## 2020-01-26 NOTE — ED Triage Notes (Signed)
Pt has multiple lacerations on multiple fingers. Bleeding is controlled.

## 2020-01-27 ENCOUNTER — Emergency Department (HOSPITAL_COMMUNITY)
Admission: EM | Admit: 2020-01-27 | Discharge: 2020-01-27 | Disposition: A | Discharge status: Home / Self Care | Payer: Medicare Other | Attending: Emergency Medicine | Admitting: Emergency Medicine

## 2020-01-27 DIAGNOSIS — S61210A Laceration without foreign body of right index finger without damage to nail, initial encounter: Secondary | ICD-10-CM

## 2020-01-27 MED ORDER — LIDOCAINE-EPINEPHRINE-TETRACAINE (LET) TOPICAL GEL
3.0000 mL | Freq: Once | TOPICAL | Status: AC
  Administered 2020-01-27: 3 mL via TOPICAL
  Filled 2020-01-27: qty 3

## 2020-01-27 MED ORDER — LIDOCAINE HCL (PF) 1 % IJ SOLN
5.0000 mL | Freq: Once | INTRAMUSCULAR | Status: DC
  Filled 2020-01-27: qty 30

## 2020-01-27 MED ORDER — CLINDAMYCIN HCL 300 MG PO CAPS
300.0000 mg | ORAL_CAPSULE | Freq: Four times a day (QID) | ORAL | 0 refills | Status: DC
Start: 2020-01-27 — End: 2020-01-27

## 2020-01-27 MED ORDER — CLINDAMYCIN HCL 150 MG PO CAPS
300.0000 mg | ORAL_CAPSULE | Freq: Once | ORAL | Status: AC
  Administered 2020-01-27: 300 mg via ORAL
  Filled 2020-01-27: qty 2

## 2020-01-27 MED ORDER — CLINDAMYCIN HCL 300 MG PO CAPS
300.0000 mg | ORAL_CAPSULE | Freq: Four times a day (QID) | ORAL | 0 refills | Status: DC
Start: 2020-01-27 — End: 2020-11-02

## 2020-01-27 NOTE — ED Notes (Signed)
Physician to bedside for laceration repair.

## 2020-01-27 NOTE — ED Provider Notes (Signed)
[facility] EMERGENCY DEPARTMENT Provider Note   CSN: [id number] Arrival date & time: 01/26/20  1950     History Chief Complaint  Patient presents with   Laceration    Edward Steele is a 64 y.o. male.  Cutting tennis balls to put on his mother's walker when the knife slipped and he got a cut over DIP on right pointer finger, superficial laceration over middle phalanx of middle finger of right hand and over proximal phalanx of left thumb. Bleeding not controlled so came here for eval. tdap with in last 2 years.    Laceration Location:  Finger Finger laceration location:  R index finger, R middle finger and L thumb      Past Medical History:  Diagnosis Date   Anemia    oral iron supplement used   Anxiety    Cancer (HCC)    skin cancer - face basal and squamous   Chronic back pain    lower back   Constipation    Depression    GERD (gastroesophageal reflux disease)    Head injury, closed, with concussion    History of kidney stones    x 4 episodes-no recent issues   Keratoconus    left eye   Lymphoma ([place])    chemo  4 and 1/2 months last chemo Mar 01, 2016   Personal history of chemotherapy 2017   lymphoma    Pneumonia    age 2   Recurrent sinus infections    past history-none recent " Inhalers were for this- no use im many yrs"    Patient Active Problem List   Diagnosis Date Noted   S/P cervical spinal fusion 08/20/2019   Calculus of ureter 08/01/2018   Obstructive sleep apnea 02/09/2018   Acute bronchitis, unspecified 02/09/2018   Breast lump 10/18/2016   Chemotherapy-induced peripheral neuropathy ([place]) 02/09/2016   MGUS (monoclonal gammopathy of unknown significance) 12/08/2015   Diffuse large B-cell lymphoma of extranodal site ([place]) 10/25/2015   S/P small bowel resection 10/17/2015    Past Surgical History:  Procedure Laterality Date   ABDOMINAL WALL DEFECT REPAIR N/A 11/08/2015   Procedure: EXPLORE LOWER  ABDOMINAL WALL  WITH BIOPSY;  Surgeon: Ralene Ok, MD;  Location: [place];  Service: General;  Laterality: N/A;   ANTERIOR CERVICAL DECOMP/DISCECTOMY FUSION N/A 08/20/2019   Procedure: ACDF - C3-C4;  Surgeon: Eustace Moore, MD;  Location: [place];  Service: Neurosurgery;  Laterality: N/A;  3C   ANTERIOR FUSION CERVICAL SPINE     x 2   BACK SURGERY     lower back with screws and rods   BOWEL RESECTION N/A 10/17/2015   Procedure: EXPLORATORY LAPAROTOMY, SMALL BOWEL RESECTION, WITH ANASTAMOSIS;  Surgeon: Ralene Ok, MD;  Location: [facility];  Service: General;  Laterality: N/A;   CATARACT EXTRACTION, BILATERAL Bilateral    CYSTOSCOPY W/ URETERAL STENT PLACEMENT Left 08/01/2018   Procedure: CYSTOSCOPY WITH RETROGRADE PYELOGRAM/URETERAL STENT PLACEMENT;  Surgeon: Franchot Gallo, MD;  Location: [facility];  Service: Urology;  Laterality: Left;   EYE SURGERY Left    corneal tranplant   LAPAROSCOPY N/A 10/17/2015   Procedure: LAPAROSCOPY DIAGNOSTIC;  Surgeon: Ralene Ok, MD;  Location: [facility];  Service: General;  Laterality: N/A;   PORT-A-CATH REMOVAL Left 05/02/2016   Procedure: REMOVAL PORT-A-CATH;  Surgeon: Ralene Ok, MD;  Location: [facility];  Service: General;  Laterality: Left;   PORTACATH PLACEMENT N/A 11/08/2015   Procedure: INSERTION PORT-A-CATH;  Surgeon: Ralene Ok, MD;  Location: [place];  Service: General;  Laterality: N/A;       Family History  Problem Relation Age of Onset   Breast cancer Sister     Social History   Tobacco Use   Smoking status: Never Smoker   Smokeless tobacco: Never Used  Vaping Use   Vaping Use: Never used  Substance Use Topics   Alcohol use: No   Drug use: No    Home Medications Prior to Admission medications   Medication Sig Start Date End Date Taking? Authorizing Provider  acetaminophen (TYLENOL) 325 MG tablet Take 650 mg by mouth every 6 (six) hours as needed for mild pain.    [provider]  albuterol (PROVENTIL  HFA;VENTOLIN HFA) 108 (90 Base) MCG/ACT inhaler Inhale 2 puffs into the lungs every 6 (six) hours as needed for wheezing or shortness of breath.     [provider]  clindamycin (CLEOCIN) 300 MG capsule Take 1 capsule (300 mg total) by mouth 4 (four) times daily. X 7 days 01/27/20   Tyreak Reagle, Corene Cornea, MD  clonazePAM (KLONOPIN) 1 MG tablet Take 1 mg by mouth 2 (two) times daily. 09/18/15   [provider]  FLUoxetine (PROZAC) 20 MG tablet Take 40 mg by mouth 2 (two) times daily. 07/28/15   [provider]  gabapentin (NEURONTIN) 300 MG capsule Take 1 capsule (300 mg total) by mouth at bedtime as needed (nerve pain). Patient taking differently: Take 300 mg by mouth 2 (two) times daily.  04/21/17   Nicholas Lose, MD  meloxicam (MOBIC) 15 MG tablet Take 15 mg by mouth daily. 07/11/19   [provider]  meloxicam (MOBIC) 7.5 MG tablet Take 1 tablet (7.5 mg total) by mouth daily. Patient taking differently: Take 15 mg by mouth daily as needed for pain.  04/21/17   Nicholas Lose, MD  Multiple Vitamins-Minerals (MULTIVITAMIN WITH MINERALS) tablet Take 1 tablet by mouth daily.    [provider]  oxyCODONE (OXY IR/ROXICODONE) 5 MG immediate release tablet Take 1 tablet (5 mg total) by mouth every 4 (four) hours as needed for moderate pain ((score 4 to 6)). 08/20/19   Eustace Moore, MD  pantoprazole (PROTONIX) 40 MG tablet Take 40 mg by mouth daily. 06/04/19   [provider]  rosuvastatin (CRESTOR) 5 MG tablet Take 5 mg by mouth at bedtime.  08/28/15   [provider]  Vitamin D3 (VITAMIN D) 25 MCG tablet Take 1,000 Units by mouth daily.    [provider]  zolpidem (AMBIEN) 10 MG tablet Take 10 mg by mouth at bedtime as needed for sleep. 02/16/19   [provider]    Allergies    Penicillin g  Review of Systems   Review of Systems  All other systems reviewed and are negative.   Physical Exam Updated Vital Signs BP (!) 150/92 (BP  Location: Left Arm)    Pulse 68    Temp 98 F (36.7 C) (Oral)    Resp 16    Ht 5\' 10"  (1.778 m)    Wt 86.2 kg    SpO2 98%    BMI 27.27 kg/m   Physical Exam Vitals and nursing note reviewed.  Constitutional:      Appearance: He is well-developed.  HENT:     Head: Normocephalic and atraumatic.     Mouth/Throat:     Mouth: Mucous membranes are moist.     Pharynx: Oropharynx is clear.  Eyes:     Pupils: Pupils are equal, round, and reactive to  light.  Cardiovascular:     Rate and Rhythm: Normal rate.  Pulmonary:     Effort: Pulmonary effort is normal. No respiratory distress.  Abdominal:     General: There is no distension.  Musculoskeletal:        General: Normal range of motion.     Cervical back: Normal range of motion.  Skin:    General: Skin is warm and dry.     Comments: 1.0 cm laceration DIP on right pointer finger, superficial laceration over middle phalanx of middle finger of right hand and over proximal phalanx of left thumb  Neurological:     General: No focal deficit present.     Mental Status: He is alert.     ED Results / Procedures / Treatments   Labs (all labs ordered are listed, but only abnormal results are displayed) Labs Reviewed - No data to display  EKG None  Radiology No results found.  Procedures .Marland KitchenLaceration Repair  Date/Time: 01/27/2020 4:50 AM Performed by: Merrily Pew, MD Authorized by: Merrily Pew, MD   Consent:    Consent obtained:  Verbal   Consent given by:  Patient   Risks discussed:  Infection, need for additional repair, nerve damage, poor wound healing, poor cosmetic result, pain, retained foreign body, tendon damage and vascular damage   Alternatives discussed:  No treatment, delayed treatment and observation Anesthesia (see MAR for exact dosages):    Anesthesia method:  Topical application   Topical anesthetic:  LET Laceration details:    Location:  Finger   Finger location:  R index finger   Length (cm):  1   Depth  (mm):  3 Repair type:    Repair type:  Simple Pre-procedure details:    Preparation:  Patient was prepped and draped in usual sterile fashion and imaging obtained to evaluate for foreign bodies Exploration:    Wound exploration: wound explored through full range of motion and entire depth of wound probed and visualized   Treatment:    Area cleansed with:  Saline   Amount of cleaning:  Extensive   Irrigation solution:  Sterile water   Irrigation volume:  200   Irrigation method:  Tap   Visualized foreign bodies/material removed: no   Skin repair:    Repair method:  Sutures and tissue adhesive   Suture size:  4-0   Suture material:  Prolene   Suture technique:  Simple interrupted   Number of sutures:  3 Approximation:    Approximation:  Close Post-procedure details:    Dressing:  Bulky dressing, splint for protection, tube gauze and non-adherent dressing   Patient tolerance of procedure:  Tolerated well, no immediate complications   (including critical care time)  Medications Ordered in ED Medications  lidocaine (PF) (XYLOCAINE) 1 % injection 5 mL (5 mLs Other Not Given 01/27/20 0249)  lidocaine-EPINEPHrine-tetracaine (LET) topical gel (3 mLs Topical Given 01/27/20 0146)  clindamycin (CLEOCIN) capsule 300 mg (300 mg Oral Given 01/27/20 0252)    ED Course  I have reviewed the triage vital signs and the nursing notes.  Pertinent labs & imaging results that were available during my care of the patient were reviewed by me and considered in my medical decision making (see chart for details).    MDM Rules/Calculators/A&P                          Wound repaired as above. Has what appears to be baseline flexion and extension  at that joint, low susicion for joint involvement or ligamentous injury. Splinted for protection. Return in 10-14 days for wound check.   Final Clinical Impression(s) / ED Diagnoses Final diagnoses:  Laceration of right index finger without foreign body  without damage to nail, initial encounter    Rx / DC Orders ED Discharge Orders         Ordered    clindamycin (CLEOCIN) 300 MG capsule  4 times daily,   Status:  Discontinued        01/27/20 0248    clindamycin (CLEOCIN) 300 MG capsule  4 times daily        01/27/20 0252           Kristal Perl, Corene Cornea, MD 01/27/20 [phone]

## 2020-01-27 NOTE — ED Notes (Signed)
Patient ambulatory out of ED.  Discharge instructions reviewed.  Patient demonstrates knowledge/understanding via teachback method.  0 s/s acute distress.

## 2020-10-31 ENCOUNTER — Other Ambulatory Visit: Payer: Self-pay

## 2020-10-31 ENCOUNTER — Inpatient Hospital Stay: Payer: Medicare Other | Attending: Hematology and Oncology

## 2020-10-31 DIAGNOSIS — Z8572 Personal history of non-Hodgkin lymphomas: Secondary | ICD-10-CM | POA: Insufficient documentation

## 2020-10-31 DIAGNOSIS — C8339 Diffuse large B-cell lymphoma, extranodal and solid organ sites: Secondary | ICD-10-CM

## 2020-10-31 LAB — CMP (CANCER CENTER ONLY)
ALT: 23 U/L (ref 0–44)
AST: 31 U/L (ref 15–41)
Albumin: 4 g/dL (ref 3.5–5.0)
Alkaline Phosphatase: 61 U/L (ref 38–126)
Anion gap: 7 (ref 5–15)
BUN: 21 mg/dL (ref 8–23)
CO2: 28 mmol/L (ref 22–32)
Calcium: 9.2 mg/dL (ref 8.9–10.3)
Chloride: 104 mmol/L (ref 98–111)
Creatinine: 1.19 mg/dL (ref 0.61–1.24)
GFR, Estimated: >60 mL/min (ref >60)
Glucose, Bld: 95 mg/dL (ref 70–99)
Potassium: 4.6 mmol/L (ref 3.5–5.1)
Sodium: 139 mmol/L (ref 135–145)
Total Bilirubin: 0.6 mg/dL (ref 0.3–1.2)
Total Protein: 8.3 g/dL — ABNORMAL HIGH (ref 6.5–8.1)

## 2020-10-31 LAB — CBC WITH DIFFERENTIAL (CANCER CENTER ONLY)
Abs Immature Granulocytes: 0 10*3/uL (ref 0.00–0.07)
Basophils Absolute: 0 10*3/uL (ref 0.0–0.1)
Basophils Relative: 0 %
Eosinophils Absolute: 0.1 10*3/uL (ref 0.0–0.5)
Eosinophils Relative: 1 %
HCT: 38.3 % — ABNORMAL LOW (ref 39.0–52.0)
Hemoglobin: 13.3 g/dL (ref 13.0–17.0)
Immature Granulocytes: 0 %
Lymphocytes Relative: 29 %
Lymphs Abs: 1.5 10*3/uL (ref 0.7–4.0)
MCH: 31 pg (ref 26.0–34.0)
MCHC: 34.7 g/dL (ref 30.0–36.0)
MCV: 89.3 fL (ref 80.0–100.0)
Monocytes Absolute: 0.4 10*3/uL (ref 0.1–1.0)
Monocytes Relative: 8 %
Neutro Abs: 3.1 10*3/uL (ref 1.7–7.7)
Neutrophils Relative %: 62 %
Platelet Count: 158 10*3/uL (ref 150–400)
RBC: 4.29 MIL/uL (ref 4.22–5.81)
RDW: 12.9 % (ref 11.5–15.5)
WBC Count: 5.1 10*3/uL (ref 4.0–10.5)
nRBC: 0 % (ref 0.0–0.2)

## 2020-10-31 LAB — LACTATE DEHYDROGENASE: LDH: 170 U/L (ref 98–192)

## 2020-11-01 NOTE — Progress Notes (Signed)
Patient Care Team: Orpah Melter, MD as PCP - General (Family Medicine) Clydene Fake, MD as Referring Physician (Hematology and Oncology)  DIAGNOSIS:    ICD-10-CM   1. Diffuse large B-cell lymphoma of extranodal site [facility])  C83.39 Multiple Myeloma Panel (SPEP&IFE w/QIG)    Kappa/lambda light chains    Beta 2 microglobulin, serum    CBC with Differential ([facility])    CMP ([place] only)    Multiple Myeloma Panel (SPEP&IFE w/QIG)    Kappa/lambda light chains    Beta 2 microglobulin, serum    SPEP (Serum protein electrophoresis)      SUMMARY OF ONCOLOGIC HISTORY: Oncology History  Diffuse large B-cell lymphoma of extranodal site ([place])  10/17/2015 Initial Diagnosis   Small intestine resection, proximal ileum: Involvement by diffuse large B-cell lymphoma,. Biopsy of peritoneal implant: Invol by a DLBCL.positive for CD20, CD79a,CD10, bcl-6, and bcl-2. CD3 and CD5 highlight scattered admixed T-cells. CD34 and TdT neg   10/31/2015 Pathology Results   Bone marrow biopsy: Hypercellular bone marrow for age 61-60% with trilineage hematopoiesis, plasma cells 9% with Kappa Light chain excess   11/02/2015 PET scan   Nodular thickening within the mesenteries of small bowel 17 mm concerning for peritoneal implant of high-grade lymphoma, activity along the abdominal wound with inflammation suspicion for infection/abscess/fistula of bladder   11/02/2015 Imaging   Ultrasound scrotum: Normal testes, no epididymitis, no varicocele or hydrocele   11/17/2015 - 03/01/2016 Chemotherapy   R CHOP 6 cycles   04/15/2016 PET scan   No residual abnormal hypermetabolic activity to suggest any residual lymphoma. Prior mesenteric activity has resolved. Metabolic activity left central prostate gland. Retracted left testicle      CHIEF COMPLIANT: Follow-up of diffuse large B-cell lymphoma  INTERVAL HISTORY: Edward Steele is a 65 y.o. with above-mentioned history of small bowel diffuse large  B-cell lymphoma who was initially treated with surgery to resect the lymphoma, which was followed by 6 cycles of R-CHOP chemotherapy. Labs on 10/31/20 showed: Hg 13.3, HCT 38.3, WBC 5.1, platelets 158, LDH 170. He presents to the clinic today for follow-up.  ALLERGIES:  is allergic to penicillin g.  MEDICATIONS:  Current Outpatient Medications  Medication Sig Dispense Refill   acetaminophen (TYLENOL) 325 MG tablet Take 650 mg by mouth every 6 (six) hours as needed for mild pain.     clonazePAM (KLONOPIN) 1 MG tablet Take 1 mg by mouth 2 (two) times daily.     FLUoxetine (PROZAC) 20 MG tablet Take 40 mg by mouth 2 (two) times daily.     gabapentin (NEURONTIN) 300 MG capsule Take 1 capsule (300 mg total) by mouth at bedtime as needed (nerve pain). (Patient taking differently: Take 300 mg by mouth 2 (two) times daily. ) 30 capsule 0   meloxicam (MOBIC) 15 MG tablet Take 15 mg by mouth daily.     Multiple Vitamins-Minerals (MULTIVITAMIN WITH MINERALS) tablet Take 1 tablet by mouth daily.     pantoprazole (PROTONIX) 40 MG tablet Take 40 mg by mouth daily.     rosuvastatin (CRESTOR) 5 MG tablet Take 5 mg by mouth at bedtime.      Vitamin D3 (VITAMIN D) 25 MCG tablet Take 1,000 Units by mouth daily.     zolpidem (AMBIEN) 10 MG tablet Take 10 mg by mouth at bedtime as needed for sleep.     No current facility-administered medications for this visit.    PHYSICAL EXAMINATION: ECOG PERFORMANCE STATUS: 1 - Symptomatic but completely ambulatory  Vitals:   11/02/20 1418  BP: 127/79  Pulse: 75  Resp: 17  Temp: 97.8 F (36.6 C)  SpO2: 99%   Filed Weights   11/02/20 1418  Weight: 195 lb 11.2 oz (88.8 kg)    LABORATORY DATA:  I have reviewed the data as listed CMP Latest Ref Rng & Units 10/31/2020 11/02/2019 08/20/2019  Glucose 70 - 99 mg/dL 95 100(H) -  BUN 8 - 23 mg/dL 21 17 -  Creatinine 0.61 - 1.24 mg/dL 1.19 1.05 1.29(H)  Sodium 135 - 145 mmol/L 139 138 -  Potassium 3.5 - 5.1 mmol/L 4.6  4.4 -  Chloride 98 - 111 mmol/L 104 104 -  CO2 22 - 32 mmol/L 28 25 -  Calcium 8.9 - 10.3 mg/dL 9.2 9.7 -  Total Protein 6.5 - 8.1 g/dL 8.3(H) 8.1 -  Total Bilirubin 0.3 - 1.2 mg/dL 0.6 0.6 -  Alkaline Phos 38 - 126 U/L 61 68 -  AST 15 - 41 U/L 31 30 -  ALT 0 - 44 U/L 23 26 -    Lab Results  Component Value Date   WBC 5.1 10/31/2020   HGB 13.3 10/31/2020   HCT 38.3 (L) 10/31/2020   MCV 89.3 10/31/2020   PLT 158 10/31/2020   NEUTROABS 3.1 10/31/2020    ASSESSMENT & PLAN:  Diffuse large B-cell lymphoma of extranodal site [facility]) Small intestine resection, proximal ileum 10/17/2015: Involvement by diffuse large B-cell lymphoma,. Biopsy of peritoneal implant: Invol by a DLBCL.positive for CD20, CD79a,CD10, bcl-6, and bcl-2. CD3 and CD5 highlight scattered admixed T-cells. CD34 and TdT neg   PET/CT 11/02/2015: Nodular thickening within the mesenteries of small bowel 17 mm concerning for peritoneal implant of high-grade lymphoma, activity along the abdominal wound with inflammation suspicion for infection/abscess/fistula of bladder Testicular ultrasound: Normal   Referred to [facility] for evaluation and for a second opinion reg the M protein and lytic lesions: They agreed that he does not have myeloma   -----------------------------------------------------------------------------------------------------------------  DLBCL: Prognosis: Revised IPI score: 1 (4 year disease free survival rate: 80%, estimated overall survival 79%) Treatment summary: R-CHOP chemotherapy 6 cycles started 11/17/2015 completed 03/01/2016 CT chest abdomen pelvis 04/30/2018: No evidence of lymphoma recurrence Plan to perform another CT scan in February and follow-up after that.   Bone marrow biopsy 10/29/2015: Hypercellular bone marrow for age 20-60% with trilineage hematopoiesis, plasma cells 9% with Kappa Light chain excess, SPEP 1.5 gm M-Protein Monoclonal paraproteinemia: Bone survey showed bone lytic  lesions.Elevated M protein MRI Rt Hip: Negative 10/29/2018: M protein 1.5 g, kappa 19.3 previously was 15.2 no evidence of any significant progression of paraproteinemia. 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 Return to clinic in 1 year with labs and follow-up.    Orders Placed This Encounter  Procedures   Multiple Myeloma Panel (SPEP&IFE w/QIG)    Standing Status:   Future    Standing Expiration Date:   11/02/2021   Kappa/lambda light chains    Standing Status:   Future    Standing Expiration Date:   11/02/2021   Beta 2 microglobulin, serum    Standing Status:   Future    Standing Expiration  Date:   11/02/2021   CBC with Differential ([facility] Only)    Standing Status:   Future    Standing Expiration Date:   11/02/2021   CMP ([facility] only)    Standing Status:   Future    Standing Expiration Date:   11/02/2021   Multiple Myeloma Panel (SPEP&IFE w/QIG)    Standing Status:   Future    Standing Expiration Date:   11/02/2021   Kappa/lambda light chains    Standing Status:   Future    Standing Expiration Date:   11/02/2021   Beta 2 microglobulin, serum    Standing Status:   Future    Standing Expiration Date:   11/02/2021   SPEP (Serum protein electrophoresis)    Standing Status:   Future    Number of Occurrences:   1    Standing Expiration Date:   11/02/2021   The patient has a good understanding of the overall plan. he agrees with it. he will call with any problems that may develop before the next visit here.  Total time spent: 20 mins including face to face time and time spent for planning, charting  and coordination of care  Rulon Eisenmenger, MD, MPH 11/02/2020  I, Edward Steele, am acting as scribe for Dr. Nicholas Lose.  I have reviewed the above documentation for accuracy and completeness, and I agree with the above.

## 2020-11-02 ENCOUNTER — Other Ambulatory Visit: Payer: Self-pay

## 2020-11-02 ENCOUNTER — Inpatient Hospital Stay (HOSPITAL_BASED_OUTPATIENT_CLINIC_OR_DEPARTMENT_OTHER): Payer: Medicare Other | Admitting: Hematology and Oncology

## 2020-11-02 DIAGNOSIS — Z8572 Personal history of non-Hodgkin lymphomas: Secondary | ICD-10-CM | POA: Diagnosis not present

## 2020-11-02 DIAGNOSIS — C8339 Diffuse large B-cell lymphoma, extranodal and solid organ sites: Secondary | ICD-10-CM | POA: Diagnosis not present

## 2020-11-02 NOTE — Assessment & Plan Note (Signed)
Small intestine resection, proximal ileum08/10/2015: Involvement by diffuse large B-cell lymphoma,. Biopsy of peritoneal implant: Invol by a DLBCL.positive for CD20, CD79a,CD10, bcl-6, and bcl-2. CD3 and CD5 highlight scattered admixed T-cells. CD34 and TdT neg  PET/CT 11/02/2015: Nodular thickening within the mesenteries of small bowel 17 mm concerning for peritoneal implant of high-grade lymphoma, activity along the abdominal wound with inflammation suspicion for infection/abscess/fistula of bladder Testicular ultrasound: Normal  Referredto [facility] for evaluation and for a second opinion reg the M protein and lytic lesions: They agreed that he does not have myeloma  ----------------------------------------------------------------------------------------------------------------- DLBCL: Prognosis: Revised IPI score: 1 (4 year disease free survival rate: 80%, estimated overall survival 79%) Treatment summary: R-CHOP chemotherapy 6 cycles started 09/08/2017completed 03/01/2016 CT chest abdomen pelvis2/20/2020: No evidence of lymphoma recurrence Plan to perform another CT scan inFebruary and follow-up after that.  Bone marrow biopsy08/20/2017: Hypercellular bone marrow for age 57-60% with trilineage hematopoiesis, plasma cells 9% with Kappa Light chain excess, SPEP 1.5 gm M-Protein Monoclonal paraproteinemia: Bone survey showed bone lytic lesions.Elevated M protein MRI Rt Hip: Negative 10/29/2018: M protein 1.5 g, kappa 19.3 previously was 15.2 no evidence of any significant progression of paraproteinemia. 11/02/2019: SPEP: M protein 1.6 g, hemoglobin 13.7, platelets 188, WBC 4.3, CMP normal limits, LDH 164 10/31/2020: Hemoglobin 13.3, LDH 170, CMP normal  CT CAP 11/02/2019: No evidence of lymphadenopathy in chest abdomen or pelvis, benign focal nodular hyperplasia right lobe of liver    Return to clinic in 1 year with labs and follow-up.

## 2020-11-06 LAB — PROTEIN ELECTROPHORESIS, SERUM
A/G Ratio: 1 (ref 0.7–1.7)
Albumin ELP: 3.9 g/dL (ref 2.9–4.4)
Alpha-1-Globulin: 0.2 g/dL (ref 0.0–0.4)
Alpha-2-Globulin: 0.6 g/dL (ref 0.4–1.0)
Beta Globulin: 1 g/dL (ref 0.7–1.3)
Gamma Globulin: 2.1 g/dL — ABNORMAL HIGH (ref 0.4–1.8)
Globulin, Total: 3.9 g/dL (ref 2.2–3.9)
M-Spike, %: 1.8 g/dL — ABNORMAL HIGH
Total Protein ELP: 7.8 g/dL (ref 6.0–8.5)

## 2021-08-15 ENCOUNTER — Other Ambulatory Visit: Payer: Self-pay | Admitting: Physician Assistant

## 2021-08-15 DIAGNOSIS — R1310 Dysphagia, unspecified: Secondary | ICD-10-CM

## 2021-08-20 ENCOUNTER — Ambulatory Visit
Admission: RE | Admit: 2021-08-20 | Discharge: 2021-08-20 | Disposition: A | Discharge status: Home / Self Care | Payer: Medicare Other | Source: Ambulatory Visit | Attending: Physician Assistant | Admitting: Physician Assistant

## 2021-08-20 DIAGNOSIS — R1310 Dysphagia, unspecified: Secondary | ICD-10-CM

## 2021-10-25 ENCOUNTER — Inpatient Hospital Stay: Payer: Medicare Other | Attending: Hematology and Oncology

## 2021-10-25 ENCOUNTER — Other Ambulatory Visit: Payer: Self-pay

## 2021-10-25 DIAGNOSIS — D472 Monoclonal gammopathy: Secondary | ICD-10-CM | POA: Diagnosis present

## 2021-10-25 DIAGNOSIS — Z8572 Personal history of non-Hodgkin lymphomas: Secondary | ICD-10-CM | POA: Diagnosis present

## 2021-10-25 DIAGNOSIS — C8339 Diffuse large B-cell lymphoma, extranodal and solid organ sites: Secondary | ICD-10-CM

## 2021-10-25 LAB — CBC WITH DIFFERENTIAL (CANCER CENTER ONLY)
Abs Immature Granulocytes: 0.01 10*3/uL (ref 0.00–0.07)
Basophils Absolute: 0 10*3/uL (ref 0.0–0.1)
Basophils Relative: 1 %
Eosinophils Absolute: 0.1 10*3/uL (ref 0.0–0.5)
Eosinophils Relative: 2 %
HCT: 37.2 % — ABNORMAL LOW (ref 39.0–52.0)
Hemoglobin: 13.2 g/dL (ref 13.0–17.0)
Immature Granulocytes: 0 %
Lymphocytes Relative: 40 %
Lymphs Abs: 1.6 10*3/uL (ref 0.7–4.0)
MCH: 31.5 pg (ref 26.0–34.0)
MCHC: 35.5 g/dL (ref 30.0–36.0)
MCV: 88.8 fL (ref 80.0–100.0)
Monocytes Absolute: 0.3 10*3/uL (ref 0.1–1.0)
Monocytes Relative: 7 %
Neutro Abs: 2 10*3/uL (ref 1.7–7.7)
Neutrophils Relative %: 50 %
Platelet Count: 146 10*3/uL — ABNORMAL LOW (ref 150–400)
RBC: 4.19 MIL/uL — ABNORMAL LOW (ref 4.22–5.81)
RDW: 13.1 % (ref 11.5–15.5)
WBC Count: 4 10*3/uL (ref 4.0–10.5)
nRBC: 0 % (ref 0.0–0.2)

## 2021-10-25 LAB — CMP (CANCER CENTER ONLY)
ALT: 18 U/L (ref 0–44)
AST: 24 U/L (ref 15–41)
Albumin: 4.2 g/dL (ref 3.5–5.0)
Alkaline Phosphatase: 57 U/L (ref 38–126)
Anion gap: 3 — ABNORMAL LOW (ref 5–15)
BUN: 26 mg/dL — ABNORMAL HIGH (ref 8–23)
CO2: 28 mmol/L (ref 22–32)
Calcium: 9.2 mg/dL (ref 8.9–10.3)
Chloride: 106 mmol/L (ref 98–111)
Creatinine: 1.01 mg/dL (ref 0.61–1.24)
GFR, Estimated: >60 mL/min (ref >60)
Glucose, Bld: 104 mg/dL — ABNORMAL HIGH (ref 70–99)
Potassium: 4 mmol/L (ref 3.5–5.1)
Sodium: 137 mmol/L (ref 135–145)
Total Bilirubin: 0.8 mg/dL (ref 0.3–1.2)
Total Protein: 8.1 g/dL (ref 6.5–8.1)

## 2021-10-26 LAB — KAPPA/LAMBDA LIGHT CHAINS
Kappa free light chain: 20.1 mg/L — ABNORMAL HIGH (ref 3.3–19.4)
Kappa, lambda light chain ratio: 1.79 — ABNORMAL HIGH (ref 0.26–1.65)
Lambda free light chains: 11.2 mg/L (ref 5.7–26.3)

## 2021-10-28 NOTE — Progress Notes (Signed)
Patient Care Team: Orpah Melter, MD as PCP - General (Family Medicine) Clydene Fake, MD as Referring Physician (Hematology and Oncology)  DIAGNOSIS: No diagnosis found.  SUMMARY OF ONCOLOGIC HISTORY: Oncology History  Diffuse large B-cell lymphoma of extranodal site ([place])  10/17/2015 Initial Diagnosis   Small intestine resection, proximal ileum: Involvement by diffuse large B-cell lymphoma,. Biopsy of peritoneal implant: Invol by a DLBCL.positive for CD20, CD79a,CD10, bcl-6, and bcl-2. CD3 and CD5 highlight scattered admixed T-cells. CD34 and TdT neg   10/31/2015 Pathology Results   Bone marrow biopsy: Hypercellular bone marrow for age 54-60% with trilineage hematopoiesis, plasma cells 9% with Kappa Light chain excess   11/02/2015 PET scan   Nodular thickening within the mesenteries of small bowel 17 mm concerning for peritoneal implant of high-grade lymphoma, activity along the abdominal wound with inflammation suspicion for infection/abscess/fistula of bladder   11/02/2015 Imaging   Ultrasound scrotum: Normal testes, no epididymitis, no varicocele or hydrocele   11/17/2015 - 03/01/2016 Chemotherapy   R CHOP 6 cycles   04/15/2016 PET scan   No residual abnormal hypermetabolic activity to suggest any residual lymphoma. Prior mesenteric activity has resolved. Metabolic activity left central prostate gland. Retracted left testicle      CHIEF COMPLIANT: Follow-up of diffuse large B-cell lymphoma    INTERVAL HISTORY: Edward Steele is a 66 y.o. with above-mentioned history of small bowel diffuse large B-cell lymphoma She presents to the clinic today for a follow-up.    ALLERGIES:  is allergic to penicillin g.  MEDICATIONS:  Current Outpatient Medications  Medication Sig Dispense Refill   acetaminophen (TYLENOL) 325 MG tablet Take 650 mg by mouth every 6 (six) hours as needed for mild pain.     clonazePAM (KLONOPIN) 1 MG tablet Take 1 mg by mouth 2 (two) times daily.      FLUoxetine (PROZAC) 20 MG tablet Take 40 mg by mouth 2 (two) times daily.     gabapentin (NEURONTIN) 300 MG capsule Take 1 capsule (300 mg total) by mouth at bedtime as needed (nerve pain). (Patient taking differently: Take 300 mg by mouth 2 (two) times daily. ) 30 capsule 0   meloxicam (MOBIC) 15 MG tablet Take 15 mg by mouth daily.     Multiple Vitamins-Minerals (MULTIVITAMIN WITH MINERALS) tablet Take 1 tablet by mouth daily.     pantoprazole (PROTONIX) 40 MG tablet Take 40 mg by mouth daily.     rosuvastatin (CRESTOR) 5 MG tablet Take 5 mg by mouth at bedtime.      Vitamin D3 (VITAMIN D) 25 MCG tablet Take 1,000 Units by mouth daily.     zolpidem (AMBIEN) 10 MG tablet Take 10 mg by mouth at bedtime as needed for sleep.     No current facility-administered medications for this visit.    PHYSICAL EXAMINATION: ECOG PERFORMANCE STATUS: {CHL ONC ECOG PS:[id number]}  There were no vitals filed for this visit. There were no vitals filed for this visit.  BREAST:*** No palpable masses or nodules in either right or left breasts. No palpable axillary supraclavicular or infraclavicular adenopathy no breast tenderness or nipple discharge. (exam performed in the presence of a chaperone)  LABORATORY DATA:  I have reviewed the data as listed    Latest Ref Rng & Units 10/25/2021   11:17 AM 10/31/2020    2:30 PM 11/02/2019   12:03 PM  CMP  Glucose 70 - 99 mg/dL 104  95  100   BUN 8 - 23 mg/dL 26  21  17   Creatinine 0.61 - 1.24 mg/dL 1.01  1.19  1.05   Sodium 135 - 145 mmol/L 137  139  138   Potassium 3.5 - 5.1 mmol/L 4.0  4.6  4.4   Chloride 98 - 111 mmol/L 106  104  104   CO2 22 - 32 mmol/L '28  28  25   ' Calcium 8.9 - 10.3 mg/dL 9.2  9.2  9.7   Total Protein 6.5 - 8.1 g/dL 8.1  8.3  8.1   Total Bilirubin 0.3 - 1.2 mg/dL 0.8  0.6  0.6   Alkaline Phos 38 - 126 U/L 57  61  68   AST 15 - 41 U/L '24  31  30   ' ALT 0 - 44 U/L '18  23  26     ' Lab Results  Component Value Date   WBC 4.0 10/25/2021    HGB 13.2 10/25/2021   HCT 37.2 (L) 10/25/2021   MCV 88.8 10/25/2021   PLT 146 (L) 10/25/2021   NEUTROABS 2.0 10/25/2021    ASSESSMENT & PLAN:  No problem-specific Assessment & Plan notes found for this encounter.    No orders of the defined types were placed in this encounter.  The patient has a good understanding of the overall plan. he agrees with it. he will call with any problems that may develop before the next visit here. Total time spent: 30 mins including face to face time and time spent for planning, charting and co-ordination of care   Suzzette Righter, [place] 10/28/21    I Gardiner Coins am scribing for Dr. Lindi Adie  ***

## 2021-10-31 ENCOUNTER — Inpatient Hospital Stay (HOSPITAL_BASED_OUTPATIENT_CLINIC_OR_DEPARTMENT_OTHER): Payer: Medicare Other | Admitting: Hematology and Oncology

## 2021-10-31 ENCOUNTER — Other Ambulatory Visit: Payer: Self-pay

## 2021-10-31 DIAGNOSIS — D472 Monoclonal gammopathy: Secondary | ICD-10-CM | POA: Diagnosis not present

## 2021-10-31 DIAGNOSIS — C8339 Diffuse large B-cell lymphoma, extranodal and solid organ sites: Secondary | ICD-10-CM | POA: Diagnosis not present

## 2021-10-31 LAB — MULTIPLE MYELOMA PANEL, SERUM
Albumin SerPl Elph-Mcnc: 3.8 g/dL (ref 2.9–4.4)
Albumin/Glob SerPl: 1.1 (ref 0.7–1.7)
Alpha 1: 0.2 g/dL (ref 0.0–0.4)
Alpha2 Glob SerPl Elph-Mcnc: 0.6 g/dL (ref 0.4–1.0)
B-Globulin SerPl Elph-Mcnc: 1 g/dL (ref 0.7–1.3)
Gamma Glob SerPl Elph-Mcnc: 2.1 g/dL — ABNORMAL HIGH (ref 0.4–1.8)
Globulin, Total: 3.8 g/dL (ref 2.2–3.9)
IgA: 76 mg/dL (ref 61–437)
IgG (Immunoglobin G), Serum: 2111 mg/dL — ABNORMAL HIGH (ref 603–1613)
IgM (Immunoglobulin M), Srm: 62 mg/dL (ref 20–172)
M Protein SerPl Elph-Mcnc: 1.7 g/dL — ABNORMAL HIGH
Total Protein ELP: 7.6 g/dL (ref 6.0–8.5)

## 2021-10-31 NOTE — Assessment & Plan Note (Signed)
Small intestine resection, proximal ileum08/10/2015: Involvement by diffuse large B-cell lymphoma,. Biopsy of peritoneal implant: Invol by a DLBCL.positive for CD20, CD79a,CD10, bcl-6, and bcl-2. CD3 and CD5 highlight scattered admixed T-cells. CD34 and TdT neg  PET/CT 11/02/2015: Nodular thickening within the mesenteries of small bowel 17 mm concerning for peritoneal implant of high-grade lymphoma, activity along the abdominal wound with inflammation suspicion for infection/abscess/fistula of bladder Testicular ultrasound: Normal  Referredto [facility] for evaluation and for a second opinion reg the M protein and lytic lesions: They agreed that he does not have myeloma  ----------------------------------------------------------------------------------------------------------------- DLBCL: Prognosis: Revised IPI score: 1 (4 year disease free survival rate: 80%, estimated overall survival 79%) Treatment summary: R-CHOP chemotherapy 6 cycles started 09/08/2017completed 03/01/2016 CT chest abdomen pelvis2/20/2020: No evidence of lymphoma recurrence Plan to perform another CT scan inFebruary and follow-up after that.  Bone marrow biopsy08/20/2017: Hypercellular bone marrow for age 52-60% with trilineage hematopoiesis, plasma cells 9% with Kappa Light chain excess, SPEP 1.5 gm M-Protein Monoclonal paraproteinemia: Bone survey showed bone lytic lesions.Elevated M protein MRI Rt Hip: Negative 10/29/2018: M protein 1.5 g, kappa 19.3 previously was 15.2 no evidence of any significant progression of paraproteinemia. 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 Return to clinic in 1 year with labs and follow-up.

## 2021-11-01 ENCOUNTER — Inpatient Hospital Stay: Payer: Medicare Other | Admitting: Hematology and Oncology

## 2022-03-26 ENCOUNTER — Telehealth: Payer: Self-pay | Admitting: Hematology and Oncology

## 2022-03-26 NOTE — Telephone Encounter (Signed)
Called patient to r/s August appointments. Patient r/s and notified.

## 2022-05-17 ENCOUNTER — Other Ambulatory Visit: Payer: Self-pay

## 2022-05-17 ENCOUNTER — Encounter (HOSPITAL_BASED_OUTPATIENT_CLINIC_OR_DEPARTMENT_OTHER): Payer: Self-pay | Admitting: Orthopaedic Surgery

## 2022-05-20 NOTE — H&P (Signed)
ORTHOPAEDIC SURGERY H&P  Subjective:  The patient presents with right Achilles rupture.   Past Medical History:  Diagnosis Date   Anemia    oral iron supplement used   Anxiety    Cancer (HCC)    skin cancer - face basal and squamous   Chronic back pain    lower back   Constipation    Depression    GERD (gastroesophageal reflux disease)    Head injury, closed, with concussion    History of kidney stones    x 4 episodes-no recent issues   Keratoconus    left eye   Lymphoma ([place])    chemo  4 and 1/2 months last chemo Mar 01, 2016   Personal history of chemotherapy 2017   lymphoma    Pneumonia    age 40   Recurrent sinus infections    past history-none recent " Inhalers were for this- no use im many yrs"   Sleep apnea     Past Surgical History:  Procedure Laterality Date   ABDOMINAL WALL DEFECT REPAIR N/A 11/08/2015   Procedure: EXPLORE LOWER  ABDOMINAL WALL WITH BIOPSY;  Surgeon: Ralene Ok, MD;  Location: [facility];  Service: General;  Laterality: N/A;   ANTERIOR CERVICAL DECOMP/DISCECTOMY FUSION N/A 08/20/2019   Procedure: ACDF - C3-C4;  Surgeon: Eustace Moore, MD;  Location: [place];  Service: Neurosurgery;  Laterality: N/A;  3C   ANTERIOR FUSION CERVICAL SPINE     x 2   BACK SURGERY     lower back with screws and rods   BOWEL RESECTION N/A 10/17/2015   Procedure: EXPLORATORY LAPAROTOMY, SMALL BOWEL RESECTION, WITH ANASTAMOSIS;  Surgeon: Ralene Ok, MD;  Location: [facility];  Service: General;  Laterality: N/A;   CATARACT EXTRACTION, BILATERAL Bilateral    CYSTOSCOPY W/ URETERAL STENT PLACEMENT Left 08/01/2018   Procedure: CYSTOSCOPY WITH RETROGRADE PYELOGRAM/URETERAL STENT PLACEMENT;  Surgeon: Franchot Gallo, MD;  Location: [facility];  Service: Urology;  Laterality: Left;   EYE SURGERY Left    corneal tranplant   LAPAROSCOPY N/A 10/17/2015   Procedure: LAPAROSCOPY DIAGNOSTIC;  Surgeon: Ralene Ok, MD;  Location: [facility];  Service: General;  Laterality: N/A;    PORT-A-CATH REMOVAL Left 05/02/2016   Procedure: REMOVAL PORT-A-CATH;  Surgeon: Ralene Ok, MD;  Location: [facility];  Service: General;  Laterality: Left;   PORTACATH PLACEMENT N/A 11/08/2015   Procedure: INSERTION PORT-A-CATH;  Surgeon: Ralene Ok, MD;  Location: [place];  Service: General;  Laterality: N/A;     (Not in an outpatient encounter)    Allergies  Allergen Reactions   Penicillin G Hives    Childhood allergy Has patient had a PCN reaction causing immediate rash, facial/tongue/throat swelling, SOB or lightheadedness with hypotension: n/a Has patient had a PCN reaction causing severe rash involving mucus membranes or skin necrosis: n/a Has patient had a PCN reaction that required hospitalization: n/a Has patient had a PCN reaction occurring within the last 10 years:  n/a If all of the above answers are "NO", then may proceed with Cephalosporin use.    Social History   Socioeconomic History   Marital status: Married    Spouse name: Not on file   Number of children: Not on file   Years of education: Not on file   Highest education level: Not on file  Occupational History   Not on file  Tobacco Use   Smoking status: Never   Smokeless tobacco: Never  Vaping Use   Vaping Use: Never used  Substance and Sexual Activity   Alcohol use: No   Drug use: No   Sexual activity: Yes  Other Topics Concern   Not on file  Social History Narrative   Not on file   Social Determinants of Health   Financial Resource Strain: Not on file  Food Insecurity: Not on file  Transportation Needs: Not on file  Physical Activity: Not on file  Stress: Not on file  Social Connections: Not on file  Intimate Partner Violence: Not on file     History reviewed. No pertinent family history.   Review of Systems Pertinent items are noted in HPI.  Objective: Vital signs in last 24 hours:    05/17/2022   11:29 AM 10/31/2021    2:28 PM 11/02/2020    2:18 PM  Vitals with BMI  Height 5'  10" '5\' 10"'$  '5\' 10"'$   Weight 190 lbs 191 lbs 195 lbs 11 oz  BMI 27.26 [id number] Q000111Q  Systolic  [id number]  Diastolic  74 79  Pulse  72 75      EXAM: General: Well nourished, well developed. Awake, alert and oriented to time, place, person. Normal mood and affect. No apparent distress. Breathing room air.  Operative Lower Extremity: Alignment - Neutral Deformity - None Skin intact Tenderness to palpation - None 0/5 TA, 5/5 PT, GS, Per, EHL, FHL Sensation intact to light touch throughout Palpable DP and PT pulses Special testing: None  The contralateral foot/ankle was examined for comparison and noted to be neurovascularly intact with no localized deformity, swelling, or tenderness.  Imaging Review All images taken were independently reviewed by me.  Assessment/Plan: The clinical and radiographic findings were reviewed and discussed at length with the patient.  The patient has right Achilles rupture.  We spoke at length about the natural course of these findings. We discussed nonoperative and operative treatment options in detail.  The risks and benefits were presented and reviewed. The risks due to rerupture, tendon transfer, implant failure/irritation, infection, stiffness, nerve/vessel/tendon injury, wound healing issues, failure of this surgery, need for further surgery, thromboembolic events, amputation, death among others were discussed. The patient acknowledged the explanation and agreed to proceed with the plan.  Edward Steele  Orthopaedic Surgery [facility]

## 2022-05-20 NOTE — Discharge Instructions (Signed)
Yazmen Briones, MD [facility]  Please read the following information regarding your care after surgery.  Medications  You only need a prescription for the narcotic pain medicine (ex. oxycodone, Percocet, Norco).  All of the other medicines listed below are available over the counter. ? Aleve 2 pills twice a day for the first 3 days after surgery. ? acetominophen (Tylenol) 650 mg every 4-6 hours as you need for minor to moderate pain ? oxycodone as prescribed for severe pain  ? To help prevent blood clots, take aspirin (81 mg) twice daily for 42 days after surgery (or total duration of nonweightbearing).  You should also get up every hour while you are awake to move around.  Weight Bearing ? Do NOT bear any weight on the operated leg or foot. This means do NOT touch your surgical leg to the ground!  Cast / Splint / Dressing ? If you have a splint, do NOT remove this. Keep your splint, cast or dressing clean and dry.  Don't put anything (coat hanger, pencil, etc) down inside of it.  If it gets wet, call the office immediately to schedule an appointment for a cast change.  Swelling IMPORTANT: It is normal for you to have swelling where you had surgery. To reduce swelling and pain, keep at least 3 pillows under your leg so that your toes are above your nose and your heel is above the level of your hip.  It may be necessary to keep your foot or leg elevated for several weeks.  This is critical to helping your incisions heal and your pain to feel better.  Follow Up Call my office at [phone] when you are discharged from the hospital or surgery center to schedule an appointment to be seen 7-10 days after surgery.  Call my office at [phone] if you develop a fever >101.5 F, nausea, vomiting, bleeding from the surgical site or severe pain.     Post Anesthesia Home Care Instructions  Activity: Get plenty of rest for the remainder of the day. A responsible individual must stay with  you for 24 hours following the procedure.  For the next 24 hours, DO NOT: -Drive a car -Operate machinery -Drink alcoholic beverages -Take any medication unless instructed by your physician -Make any legal decisions or sign important papers.  Meals: Start with liquid foods such as gelatin or soup. Progress to regular foods as tolerated. Avoid greasy, spicy, heavy foods. If nausea and/or vomiting occur, drink only clear liquids until the nausea and/or vomiting subsides. Call your physician if vomiting continues.  Special Instructions/Symptoms: Your throat may feel dry or sore from the anesthesia or the breathing tube placed in your throat during surgery. If this causes discomfort, gargle with warm salt water. The discomfort should disappear within 24 hours.    Regional Anesthesia Blocks  1. Numbness or the inability to move the "blocked" extremity may last from 3-48 hours after placement. The length of time depends on the medication injected and your individual response to the medication. If the numbness is not going away after 48 hours, call your surgeon.  2. The extremity that is blocked will need to be protected until the numbness is gone and the  Strength has returned. Because you cannot feel it, you will need to take extra care to avoid injury. Because it may be weak, you may have difficulty moving it or using it. You may not know what position it is in without looking at it while the block is in   effect.  3. For blocks in the legs and feet, returning to weight bearing and walking needs to be done carefully. You will need to wait until the numbness is entirely gone and the strength has returned. You should be able to move your leg and foot normally before you try and bear weight or walk. You will need someone to be with you when you first try to ensure you do not fall and possibly risk injury.  4. Bruising and tenderness at the needle site are common side effects and will resolve in a few  days.  5. Persistent numbness or new problems with movement should be communicated to the surgeon or the [facility] ([phone])/ [facility] ([phone]). 

## 2022-05-22 ENCOUNTER — Other Ambulatory Visit: Payer: Self-pay

## 2022-05-22 ENCOUNTER — Ambulatory Visit (HOSPITAL_BASED_OUTPATIENT_CLINIC_OR_DEPARTMENT_OTHER): Payer: Medicare Other | Admitting: Anesthesiology

## 2022-05-22 ENCOUNTER — Ambulatory Visit (HOSPITAL_BASED_OUTPATIENT_CLINIC_OR_DEPARTMENT_OTHER)
Admission: RE | Admit: 2022-05-22 | Discharge: 2022-05-22 | Disposition: A | Discharge status: Home / Self Care | Payer: Medicare Other | Attending: Orthopaedic Surgery | Admitting: Orthopaedic Surgery

## 2022-05-22 ENCOUNTER — Encounter (HOSPITAL_BASED_OUTPATIENT_CLINIC_OR_DEPARTMENT_OTHER): Payer: Self-pay | Admitting: Orthopaedic Surgery

## 2022-05-22 ENCOUNTER — Encounter (HOSPITAL_BASED_OUTPATIENT_CLINIC_OR_DEPARTMENT_OTHER)
Admission: RE | Disposition: A | Discharge status: Home / Self Care | Payer: Self-pay | Source: Home / Self Care | Attending: Orthopaedic Surgery

## 2022-05-22 DIAGNOSIS — G709 Myoneural disorder, unspecified: Secondary | ICD-10-CM | POA: Diagnosis not present

## 2022-05-22 DIAGNOSIS — Z01818 Encounter for other preprocedural examination: Secondary | ICD-10-CM

## 2022-05-22 DIAGNOSIS — G4730 Sleep apnea, unspecified: Secondary | ICD-10-CM | POA: Diagnosis not present

## 2022-05-22 DIAGNOSIS — S86011A Strain of right Achilles tendon, initial encounter: Secondary | ICD-10-CM | POA: Diagnosis present

## 2022-05-22 DIAGNOSIS — X58XXXA Exposure to other specified factors, initial encounter: Secondary | ICD-10-CM | POA: Insufficient documentation

## 2022-05-22 HISTORY — DX: Sleep apnea, unspecified: G47.30

## 2022-05-22 HISTORY — PX: ACHILLES TENDON SURGERY: SHX542

## 2022-05-22 SURGERY — REPAIR, TENDON, ACHILLES
Anesthesia: General | Site: Leg Lower | Laterality: Right

## 2022-05-22 MED ORDER — FENTANYL CITRATE (PF) 100 MCG/2ML IJ SOLN
INTRAMUSCULAR | Status: AC
  Filled 2022-05-22: qty 2

## 2022-05-22 MED ORDER — VANCOMYCIN HCL IN DEXTROSE 1-5 GM/200ML-% IV SOLN
INTRAVENOUS | Status: AC
  Filled 2022-05-22: qty 200

## 2022-05-22 MED ORDER — ONDANSETRON HCL 4 MG/2ML IJ SOLN
INTRAMUSCULAR | Status: DC | PRN
  Administered 2022-05-22: 4 mg via INTRAVENOUS

## 2022-05-22 MED ORDER — FENTANYL CITRATE (PF) 100 MCG/2ML IJ SOLN
INTRAMUSCULAR | Status: DC | PRN
  Administered 2022-05-22: 50 ug via INTRAVENOUS

## 2022-05-22 MED ORDER — ROCURONIUM BROMIDE 10 MG/ML (PF) SYRINGE
PREFILLED_SYRINGE | INTRAVENOUS | Status: AC
  Filled 2022-05-22: qty 10

## 2022-05-22 MED ORDER — FENTANYL CITRATE (PF) 100 MCG/2ML IJ SOLN: 25.0000 ug | INTRAMUSCULAR | Status: DC | PRN

## 2022-05-22 MED ORDER — ROCURONIUM BROMIDE 100 MG/10ML IV SOLN
INTRAVENOUS | Status: DC | PRN
  Administered 2022-05-22: 60 mg via INTRAVENOUS

## 2022-05-22 MED ORDER — POVIDONE-IODINE 10 % EX SOLN
CUTANEOUS | Status: DC | PRN
  Administered 2022-05-22: 1 via TOPICAL

## 2022-05-22 MED ORDER — VANCOMYCIN HCL IN DEXTROSE 1-5 GM/200ML-% IV SOLN
1000.0000 mg | INTRAVENOUS | Status: AC
  Administered 2022-05-22: 1000 mg via INTRAVENOUS

## 2022-05-22 MED ORDER — LACTATED RINGERS IV SOLN: INTRAVENOUS | Status: DC

## 2022-05-22 MED ORDER — MIDAZOLAM HCL 2 MG/2ML IJ SOLN
INTRAMUSCULAR | Status: AC
  Filled 2022-05-22: qty 2

## 2022-05-22 MED ORDER — ONDANSETRON HCL 4 MG/2ML IJ SOLN
INTRAMUSCULAR | Status: AC
  Filled 2022-05-22: qty 2

## 2022-05-22 MED ORDER — PHENYLEPHRINE HCL (PRESSORS) 10 MG/ML IV SOLN
INTRAVENOUS | Status: DC | PRN
  Administered 2022-05-22: 80 ug via INTRAVENOUS

## 2022-05-22 MED ORDER — LIDOCAINE HCL (CARDIAC) PF 100 MG/5ML IV SOSY
PREFILLED_SYRINGE | INTRAVENOUS | Status: DC | PRN
  Administered 2022-05-22: 40 mg via INTRAVENOUS

## 2022-05-22 MED ORDER — ACETAMINOPHEN 325 MG PO TABS: 325.0000 mg | ORAL_TABLET | ORAL | Status: DC | PRN

## 2022-05-22 MED ORDER — EPHEDRINE SULFATE (PRESSORS) 50 MG/ML IJ SOLN
INTRAMUSCULAR | Status: DC | PRN
  Administered 2022-05-22: 15 mg via INTRAVENOUS
  Administered 2022-05-22: 10 mg via INTRAVENOUS

## 2022-05-22 MED ORDER — DEXAMETHASONE SODIUM PHOSPHATE 10 MG/ML IJ SOLN
INTRAMUSCULAR | Status: AC
  Filled 2022-05-22: qty 1

## 2022-05-22 MED ORDER — 0.9 % SODIUM CHLORIDE (POUR BTL) OPTIME
TOPICAL | Status: DC | PRN
  Administered 2022-05-22: 1000 mL

## 2022-05-22 MED ORDER — OXYCODONE HCL 5 MG/5ML PO SOLN: 5.0000 mg | Freq: Once | ORAL | Status: DC | PRN

## 2022-05-22 MED ORDER — OXYCODONE HCL 5 MG PO TABS: 5.0000 mg | ORAL_TABLET | Freq: Once | ORAL | Status: DC | PRN

## 2022-05-22 MED ORDER — EPHEDRINE 5 MG/ML INJ
INTRAVENOUS | Status: AC
  Filled 2022-05-22: qty 5

## 2022-05-22 MED ORDER — LIDOCAINE 2% (20 MG/ML) 5 ML SYRINGE
INTRAMUSCULAR | Status: AC
  Filled 2022-05-22: qty 5

## 2022-05-22 MED ORDER — AMISULPRIDE (ANTIEMETIC) 5 MG/2ML IV SOLN: 10.0000 mg | Freq: Once | INTRAVENOUS | Status: DC | PRN

## 2022-05-22 MED ORDER — PROPOFOL 10 MG/ML IV BOLUS
INTRAVENOUS | Status: AC
  Filled 2022-05-22: qty 20

## 2022-05-22 MED ORDER — SUGAMMADEX SODIUM 200 MG/2ML IV SOLN
INTRAVENOUS | Status: DC | PRN
  Administered 2022-05-22: 200 mg via INTRAVENOUS

## 2022-05-22 MED ORDER — CIPROFLOXACIN IN D5W 400 MG/200ML IV SOLN
INTRAVENOUS | Status: AC
  Filled 2022-05-22: qty 200

## 2022-05-22 MED ORDER — ACETAMINOPHEN 160 MG/5ML PO SOLN: 325.0000 mg | ORAL | Status: DC | PRN

## 2022-05-22 MED ORDER — PROMETHAZINE HCL 25 MG/ML IJ SOLN: 6.2500 mg | INTRAMUSCULAR | Status: DC | PRN

## 2022-05-22 MED ORDER — ACETAMINOPHEN 10 MG/ML IV SOLN: 1000.0000 mg | Freq: Once | INTRAVENOUS | Status: DC | PRN

## 2022-05-22 MED ORDER — FENTANYL CITRATE (PF) 100 MCG/2ML IJ SOLN
50.0000 ug | Freq: Once | INTRAMUSCULAR | Status: AC
  Administered 2022-05-22: 50 ug via INTRAVENOUS

## 2022-05-22 MED ORDER — DEXAMETHASONE SODIUM PHOSPHATE 4 MG/ML IJ SOLN
INTRAMUSCULAR | Status: DC | PRN
  Administered 2022-05-22: 10 mg via INTRAVENOUS

## 2022-05-22 MED ORDER — PROPOFOL 10 MG/ML IV BOLUS
INTRAVENOUS | Status: DC | PRN
  Administered 2022-05-22: 150 mg via INTRAVENOUS

## 2022-05-22 MED ORDER — CIPROFLOXACIN IN D5W 400 MG/200ML IV SOLN
400.0000 mg | Freq: Once | INTRAVENOUS | Status: AC
  Administered 2022-05-22: 400 mg via INTRAVENOUS

## 2022-05-22 MED ORDER — MIDAZOLAM HCL 2 MG/2ML IJ SOLN
2.0000 mg | Freq: Once | INTRAMUSCULAR | Status: AC
  Administered 2022-05-22: 2 mg via INTRAVENOUS

## 2022-05-22 SURGICAL SUPPLY — 76 items
APL PRP STRL LF DISP 70% ISPRP (MISCELLANEOUS) ×2
BANDAGE ESMARK 6X9 LF (GAUZE/BANDAGES/DRESSINGS) ×1 IMPLANT
BLADE AVERAGE 25X9 (BLADE) IMPLANT
BLADE MICRO SAGITTAL (BLADE) IMPLANT
BLADE SURG 15 STRL LF DISP TIS (BLADE) ×2 IMPLANT
BLADE SURG 15 STRL SS (BLADE) ×3
BNDG CMPR 5X4 CHSV STRCH STRL (GAUZE/BANDAGES/DRESSINGS) ×1
BNDG CMPR 5X4 KNIT ELC UNQ LF (GAUZE/BANDAGES/DRESSINGS) ×2
BNDG CMPR 5X62 HK CLSR LF (GAUZE/BANDAGES/DRESSINGS) ×1
BNDG CMPR 6"X 5 YARDS HK CLSR (GAUZE/BANDAGES/DRESSINGS) ×1
BNDG CMPR 9X6 STRL LF SNTH (GAUZE/BANDAGES/DRESSINGS) ×1
BNDG COHESIVE 4X5 TAN STRL LF (GAUZE/BANDAGES/DRESSINGS) ×1 IMPLANT
BNDG ELASTIC 4INX 5YD STR LF (GAUZE/BANDAGES/DRESSINGS) ×2 IMPLANT
BNDG ELASTIC 4X5.8 VLCR STR LF (GAUZE/BANDAGES/DRESSINGS) IMPLANT
BNDG ELASTIC 6INX 5YD STR LF (GAUZE/BANDAGES/DRESSINGS) ×1 IMPLANT
BNDG ESMARK 6X9 LF (GAUZE/BANDAGES/DRESSINGS) ×1
BNDG GAUZE DERMACEA FLUFF 4 (GAUZE/BANDAGES/DRESSINGS) ×1 IMPLANT
BNDG GZE DERMACEA 4 6PLY (GAUZE/BANDAGES/DRESSINGS) ×1
CANISTER SUCT 1200ML W/VALVE (MISCELLANEOUS) ×1 IMPLANT
CHLORAPREP W/TINT 26 (MISCELLANEOUS) ×1 IMPLANT
COVER BACK TABLE 60X90IN (DRAPES) ×1 IMPLANT
CUFF TOURN SGL QUICK 34 (TOURNIQUET CUFF) ×1
CUFF TRNQT CYL 34X4.125X (TOURNIQUET CUFF) ×1 IMPLANT
DRAPE EXTREMITY T 121X128X90 (DISPOSABLE) ×1 IMPLANT
DRAPE IMP U-DRAPE 54X76 (DRAPES) ×1 IMPLANT
DRAPE OEC MINIVIEW 54X84 (DRAPES) IMPLANT
DRAPE U-SHAPE 47X51 STRL (DRAPES) ×1 IMPLANT
DRSG ADAPTIC 3X8 NADH LF (GAUZE/BANDAGES/DRESSINGS) ×1 IMPLANT
ELECT REM PT RETURN 9FT ADLT (ELECTROSURGICAL) ×1
ELECTRODE REM PT RTRN 9FT ADLT (ELECTROSURGICAL) ×1 IMPLANT
GAUZE PAD ABD 8X10 STRL (GAUZE/BANDAGES/DRESSINGS) ×5 IMPLANT
GAUZE SPONGE 4X4 12PLY STRL (GAUZE/BANDAGES/DRESSINGS) ×1 IMPLANT
GLOVE BIOGEL PI IND STRL 8 (GLOVE) ×1 IMPLANT
GLOVE SURG SS PI 7.5 STRL IVOR (GLOVE) ×2 IMPLANT
GOWN STRL REUS W/ TWL LRG LVL3 (GOWN DISPOSABLE) ×2 IMPLANT
GOWN STRL REUS W/TWL LRG LVL3 (GOWN DISPOSABLE) ×2
NDL HYPO 25X1 1.5 SAFETY (NEEDLE) IMPLANT
NDL SUT 6 .5 CRC .975X.05 MAYO (NEEDLE) IMPLANT
NEEDLE HYPO 25X1 1.5 SAFETY (NEEDLE) IMPLANT
NEEDLE MAYO TAPER (NEEDLE)
PACK BASIN DAY SURGERY FS (CUSTOM PROCEDURE TRAY) ×1 IMPLANT
PAD CAST 4YDX4 CTTN HI CHSV (CAST SUPPLIES) ×1 IMPLANT
PADDING CAST ABS COTTON 4X4 ST (CAST SUPPLIES) IMPLANT
PADDING CAST COTTON 4X4 STRL (CAST SUPPLIES) ×1
PADDING CAST SYNTHETIC 4X4 STR (CAST SUPPLIES) ×3 IMPLANT
PENCIL SMOKE EVACUATOR (MISCELLANEOUS) ×1 IMPLANT
SANITIZER HAND PURELL FF 515ML (MISCELLANEOUS) ×1 IMPLANT
SHEET MEDIUM DRAPE 40X70 STRL (DRAPES) ×1 IMPLANT
SLEEVE SCD COMPRESS KNEE MED (STOCKING) ×1 IMPLANT
SPLINT PLASTER CAST FAST 5X30 (CAST SUPPLIES) ×20 IMPLANT
SPONGE T-LAP 18X18 ~~LOC~~+RFID (SPONGE) ×1 IMPLANT
STOCKINETTE 6  STRL (DRAPES) ×1
STOCKINETTE 6 STRL (DRAPES) ×1 IMPLANT
SUCTION FRAZIER HANDLE 10FR (MISCELLANEOUS) ×1
SUCTION TUBE FRAZIER 10FR DISP (MISCELLANEOUS) ×1 IMPLANT
SUT ETHILON 2 0 FS 18 (SUTURE) IMPLANT
SUT FIBERWIRE #2 38 T-5 BLUE (SUTURE)
SUT MNCRL AB 3-0 PS2 18 (SUTURE) ×1 IMPLANT
SUT VIC AB 0 CT1 27 (SUTURE)
SUT VIC AB 0 CT1 27XBRD ANBCTR (SUTURE) ×1 IMPLANT
SUT VIC AB 1 CT1 27 (SUTURE) ×3
SUT VIC AB 1 CT1 27XBRD ANBCTR (SUTURE) IMPLANT
SUT VIC AB 2-0 SH 27 (SUTURE) ×1
SUT VIC AB 2-0 SH 27XBRD (SUTURE) ×1 IMPLANT
SUT VIC AB 3-0 SH 27 (SUTURE)
SUT VIC AB 3-0 SH 27X BRD (SUTURE) ×1 IMPLANT
SUT VICRYL 0 SH 27 (SUTURE) IMPLANT
SUTURE FIBERWR #2 38 T-5 BLUE (SUTURE) IMPLANT
SUTURE TAPE 1.3 FIBERLOP 20 ST (SUTURE) IMPLANT
SUTURETAPE 1.3 FIBERLOOP 20 ST (SUTURE)
SYR BULB EAR ULCER 3OZ GRN STR (SYRINGE) ×1 IMPLANT
SYR CONTROL 10ML LL (SYRINGE) IMPLANT
TOWEL GREEN STERILE FF (TOWEL DISPOSABLE) ×2 IMPLANT
TUBE CONNECTING 20X1/4 (TUBING) ×1 IMPLANT
UNDERPAD 30X36 HEAVY ABSORB (UNDERPADS AND DIAPERS) ×1 IMPLANT
YANKAUER SUCT BULB TIP NO VENT (SUCTIONS) IMPLANT

## 2022-05-22 NOTE — Anesthesia Preprocedure Evaluation (Addendum)
Anesthesia Evaluation  Patient identified by MRN, date of birth, ID band Patient awake    Reviewed: Allergy & Precautions, NPO status , Patient's Chart, lab work & pertinent test results  Airway Mallampati: II  TM Distance: >3 FB Neck ROM: Full    Dental   Pulmonary sleep apnea    breath sounds clear to auscultation       Cardiovascular negative cardio ROS  Rhythm:Regular Rate:Normal     Neuro/Psych  PSYCHIATRIC DISORDERS Anxiety Depression     Neuromuscular disease    GI/Hepatic Neg liver ROS,GERD  Medicated,,  Endo/Other  negative endocrine ROS    Renal/GU negative Renal ROS     Musculoskeletal negative musculoskeletal ROS (+)    Abdominal   Peds  Hematology   Anesthesia Other Findings   Reproductive/Obstetrics                             Anesthesia Physical Anesthesia Plan  ASA: 2  Anesthesia Plan: General   Post-op Pain Management: Regional block*   Induction: Intravenous  PONV Risk Score and Plan: 3 and Ondansetron, Dexamethasone and Midazolam  Airway Management Planned: Oral ETT  Additional Equipment: None  Intra-op Plan:   Post-operative Plan: Extubation in OR  Informed Consent: I have reviewed the patients History and Physical, chart, labs and discussed the procedure including the risks, benefits and alternatives for the proposed anesthesia with the patient or authorized representative who has indicated his/her understanding and acceptance.     Dental advisory given  Plan Discussed with: CRNA  Anesthesia Plan Comments:        Anesthesia Quick Evaluation

## 2022-05-22 NOTE — H&P (Signed)
H&P Update:  -History and Physical Reviewed  -Patient has been re-examined  -No change in the plan of care  -The risks and benefits were presented and reviewed. The risks due to hardware/suture failure and/or irritation, new/persistent infection, stiffness, nerve/vessel/tendon injury or rerupture of repaired tendon, nonunion/malunion, allograft usage, wound healing issues, development of arthritis, failure of this surgery, possibility of external fixation with delayed definitive surgery, need for further surgery, thromboembolic events, anesthesia/medical complications, amputation, death among others were discussed. The patient acknowledged the explanation, agreed to proceed with the plan and a consent was signed.  Edward Steele  

## 2022-05-22 NOTE — Transfer of Care (Signed)
Immediate Anesthesia Transfer of Care Note  Patient: Edward Steele  Procedure(s) Performed: ACHILLES TENDON REPAIR, POSSIBLE TENDON TRANSFER (Right: Leg Lower)  Patient Location: PACU  Anesthesia Type:GA combined with regional for post-op pain  Level of Consciousness: drowsy, patient cooperative, and responds to stimulation  Airway & Oxygen Therapy: Patient Spontanous Breathing and Patient connected to face mask oxygen  Post-op Assessment: Report given to RN and Post -op Vital signs reviewed and stable  Post vital signs: Reviewed and stable  Last Vitals:  Vitals Value Taken Time  BP    Temp    Pulse 88 05/22/22 1336  Resp    SpO2 97 % 05/22/22 1336  Vitals shown include unvalidated device data.  Last Pain:  Vitals:   05/22/22 0907  TempSrc: Oral  PainSc: 0-No pain         Complications: No notable events documented.

## 2022-05-22 NOTE — Anesthesia Procedure Notes (Signed)
Procedure Name: Intubation Date/Time: 05/22/2022 11:56 AM  Performed by: Glory Buff, CRNAPre-anesthesia Checklist: Patient identified, Emergency Drugs available, Suction available and Patient being monitored Patient Re-evaluated:Patient Re-evaluated prior to induction Oxygen Delivery Method: Circle system utilized Preoxygenation: Pre-oxygenation with 100% oxygen Induction Type: IV induction Ventilation: Mask ventilation without difficulty Laryngoscope Size: Miller and 3 Grade View: Grade II Tube type: Oral Tube size: 7.5 mm Number of attempts: 1 Airway Equipment and Method: Stylet and Oral airway Placement Confirmation: ETT inserted through vocal cords under direct vision, positive ETCO2 and breath sounds checked- equal and bilateral Secured at: 21 cm Tube secured with: Tape Dental Injury: Teeth and Oropharynx as per pre-operative assessment

## 2022-05-22 NOTE — [place] Notes (Signed)
Assisted Dr. Smith Robert with right, popliteal, ultrasound guided block. Side rails up, monitors on throughout procedure. See vital signs in flow sheet. Tolerated Procedure well.

## 2022-05-22 NOTE — Anesthesia Postprocedure Evaluation (Signed)
Anesthesia Post Note  Patient: [name]  Procedure(s) Performed: ACHILLES TENDON REPAIR, POSSIBLE TENDON TRANSFER (Right: Leg Lower)     Patient location during evaluation: PACU Anesthesia Type: General Level of consciousness: awake and alert Pain management: pain level controlled Vital Signs Assessment: post-procedure vital signs reviewed and stable Respiratory status: spontaneous breathing, nonlabored ventilation, respiratory function stable and patient connected to nasal cannula oxygen Cardiovascular status: blood pressure returned to baseline and stable Postop Assessment: no apparent nausea or vomiting Anesthetic complications: no   No notable events documented.  Last Vitals:  Vitals:   05/22/22 1415 05/22/22 1430  BP: 119/74 121/66  Pulse: 69 70  Resp: 19 18  Temp:  36.4 C  SpO2: 96% 97%    Last Pain:  Vitals:   05/22/22 1430  TempSrc:   PainSc: 0-No pain                 Effie Berkshire

## 2022-05-23 ENCOUNTER — Encounter (HOSPITAL_BASED_OUTPATIENT_CLINIC_OR_DEPARTMENT_OTHER): Payer: Self-pay | Admitting: Orthopaedic Surgery

## 2022-05-23 NOTE — Op Note (Signed)
05/22/2022  12:47 AM   PATIENT: Edward Steele  67 y.o. male  MRN: [id number]   PRE-OPERATIVE DIAGNOSIS:   Rupture of right Achilles tendon   POST-OPERATIVE DIAGNOSIS:   Rupture of right Achilles tendon   PROCEDURE: 1] Open primary repair of right Achilles midsubstance tendon rupture 2] Open fasciotomy with release of posterior compartment of right leg   SURGEON:  Armond Hang, MD   ASSISTANT: None   ANESTHESIA: General, regional   EBL: Minimal   TOURNIQUET:    Total Tourniquet Time Documented: Thigh (Right) - 58 minutes Total: Thigh (Right) - 58 minutes    COMPLICATIONS: None apparent   DISPOSITION: Extubated, awake and stable to recovery.   INDICATION FOR PROCEDURE: The patient presented with above diagnosis.  We discussed the diagnosis, alternative treatment options, risks and benefits of the above surgical intervention, as well as alternative non-operative treatments. All questions/concerns were addressed and the patient/family demonstrated appropriate understanding of the diagnosis, the procedure, the postoperative course, and overall prognosis. The patient wished to proceed with surgical intervention and signed an informed surgical consent as such, in each others presence prior to surgery.   PROCEDURE IN DETAIL: After preoperative consent was obtained and the correct operative site was identified, the patient was brought to the operating room supine on stretcher. The operative lower extremity was prepped and draped in standard sterile fashion with a tourniquet around the thigh. The patient was then transferred onto operating table prone. General anesthesia was induced. Preoperative antibiotics were administered. Surgical timeout was taken. The extremity was exsanguinated and the tourniquet was inflated to 275 mmHg.  A longitudinal incision was made over the palpable defect in the Achilles.  Dissection was carried sharply down through the subcutaneous  tissues and peritenon creating full-thickness flaps medially and laterally.  The Achilles tendon rupture was identified.  Hematoma was debrided.  The tendon ends were trimmed with scissors.  Release of the posterior compartment was performed with longitudinal fasciotomy in the surgical bed.  The tendon ends were roughly approximated.  #1 Vicryl box sutures on CT-1 ineedle were placed in each tendon stump 1 cm, 2 cm and 3 cm from the rupture site.  The ankle was then maximally plantarflexed.  The tendon ends were then sequentially tied from near to far.  The [place] test was noted to be normal at this point.  A 3-0 Monocryl running circumferential stitch was placed around the tendon to augment the repair.  The surgical sites were thoroughly irrigated. The tourniquet was deflated and hemostasis achieved. The paratenon was closed using 3-0 vicyrl. The deep layers were closed using 2-0 vicryl. The skin was closed without tension using 2-0 nylon suture.    The leg was cleaned with saline and sterile dressings with gauze were applied. A well padded bulky short leg splint was applied in plantarflexion. The patient was awakened from anesthesia and transported to the recovery room in stable condition.    FOLLOW UP PLAN: -transfer to PACU, then home -strict NWB operative extremity in plantarflexion, maximum elevation -maintain short leg splint until follow up -DVT ppx: Aspirin 81 mg twice daily while NWB -follow up as outpatient within 7-10 days for wound check with exchange of short leg splint to short leg cast -sutures out in 2-3 weeks in outpatient office   RADIOGRAPHS: None used   Armond Hang Orthopaedic Surgery [facility]

## 2022-07-24 ENCOUNTER — Telehealth: Payer: Self-pay | Admitting: Hematology and Oncology

## 2022-10-08 ENCOUNTER — Telehealth: Payer: Self-pay

## 2022-10-08 NOTE — Telephone Encounter (Signed)
Pt's lab and MD visit r/s d/t scheduling conflict. He will come in 8/13 for labs and 8/29 for MD visit for yearly f/u. He is aware and verbalized understanding.

## 2022-10-09 ENCOUNTER — Inpatient Hospital Stay: Payer: Medicare Other

## 2022-10-10 ENCOUNTER — Other Ambulatory Visit: Payer: Medicare Other

## 2022-10-17 ENCOUNTER — Ambulatory Visit: Payer: Medicare Other | Admitting: Hematology and Oncology

## 2022-10-22 ENCOUNTER — Inpatient Hospital Stay: Payer: Medicare Other | Attending: Hematology and Oncology

## 2022-10-22 ENCOUNTER — Other Ambulatory Visit: Payer: Self-pay

## 2022-10-22 DIAGNOSIS — C8339 Diffuse large B-cell lymphoma, extranodal and solid organ sites: Secondary | ICD-10-CM | POA: Insufficient documentation

## 2022-10-22 LAB — CBC WITH DIFFERENTIAL (CANCER CENTER ONLY)
Abs Immature Granulocytes: 0.01 10*3/uL (ref 0.00–0.07)
Basophils Absolute: 0 10*3/uL (ref 0.0–0.1)
Basophils Relative: 0 %
Eosinophils Absolute: 0.1 10*3/uL (ref 0.0–0.5)
Eosinophils Relative: 2 %
HCT: 37.8 % — ABNORMAL LOW (ref 39.0–52.0)
Hemoglobin: 13.6 g/dL (ref 13.0–17.0)
Immature Granulocytes: 0 %
Lymphocytes Relative: 40 %
Lymphs Abs: 1.8 10*3/uL (ref 0.7–4.0)
MCH: 32.3 pg (ref 26.0–34.0)
MCHC: 36 g/dL (ref 30.0–36.0)
MCV: 89.8 fL (ref 80.0–100.0)
Monocytes Absolute: 0.3 10*3/uL (ref 0.1–1.0)
Monocytes Relative: 7 %
Neutro Abs: 2.3 10*3/uL (ref 1.7–7.7)
Neutrophils Relative %: 51 %
Platelet Count: 135 10*3/uL — ABNORMAL LOW (ref 150–400)
RBC: 4.21 MIL/uL — ABNORMAL LOW (ref 4.22–5.81)
RDW: 12.8 % (ref 11.5–15.5)
WBC Count: 4.6 10*3/uL (ref 4.0–10.5)
nRBC: 0 % (ref 0.0–0.2)

## 2022-10-22 LAB — CMP (CANCER CENTER ONLY)
ALT: 19 U/L (ref 0–44)
AST: 25 U/L (ref 15–41)
Albumin: 4 g/dL (ref 3.5–5.0)
Alkaline Phosphatase: 61 U/L (ref 38–126)
Anion gap: 4 — ABNORMAL LOW (ref 5–15)
BUN: 25 mg/dL — ABNORMAL HIGH (ref 8–23)
CO2: 29 mmol/L (ref 22–32)
Calcium: 8.8 mg/dL — ABNORMAL LOW (ref 8.9–10.3)
Chloride: 105 mmol/L (ref 98–111)
Creatinine: 1.22 mg/dL (ref 0.61–1.24)
GFR, Estimated: >60 mL/min (ref >60)
Glucose, Bld: 94 mg/dL (ref 70–99)
Potassium: 4.7 mmol/L (ref 3.5–5.1)
Sodium: 138 mmol/L (ref 135–145)
Total Bilirubin: 0.6 mg/dL (ref 0.3–1.2)
Total Protein: 7.9 g/dL (ref 6.5–8.1)

## 2022-10-22 LAB — LACTATE DEHYDROGENASE: LDH: 132 U/L (ref 98–192)

## 2022-10-24 ENCOUNTER — Other Ambulatory Visit: Payer: Medicare Other

## 2022-10-25 LAB — MULTIPLE MYELOMA PANEL, SERUM: IgA: 107 mg/dL (ref 61–437)

## 2022-10-31 ENCOUNTER — Ambulatory Visit: Payer: Medicare Other | Admitting: Hematology and Oncology

## 2022-11-05 ENCOUNTER — Telehealth: Payer: Self-pay | Admitting: Hematology and Oncology

## 2022-11-07 ENCOUNTER — Inpatient Hospital Stay (HOSPITAL_BASED_OUTPATIENT_CLINIC_OR_DEPARTMENT_OTHER): Payer: Medicare Other | Admitting: Hematology and Oncology

## 2022-11-07 DIAGNOSIS — C8339 Diffuse large B-cell lymphoma, extranodal and solid organ sites: Secondary | ICD-10-CM | POA: Diagnosis not present

## 2022-11-07 NOTE — Progress Notes (Signed)
HEMATOLOGY-ONCOLOGY TELEPHONE VISIT PROGRESS NOTE  I connected with our patient on 11/07/22 at 10:15 AM EDT by telephone and verified that I am speaking with the correct person using two identifiers.  I discussed the limitations, risks, security and privacy concerns of performing an evaluation and management service by telephone and the availability of in person appointments.  I also discussed with the patient that there may be a patient responsible charge related to this service. The patient expressed understanding and agreed to proceed.   History of Present Illness: Edward Steele is a 67 y.o. with above-mentioned history of small bowel diffuse large B-cell lymphoma She presents to the clinic today for a telephone follow-up to discuss labs.   Oncology History  Diffuse large B-cell lymphoma of extranodal site (HCC)  10/17/2015 Initial Diagnosis   Small intestine resection, proximal ileum: Involvement by diffuse large B-cell lymphoma,. Biopsy of peritoneal implant: Invol by a DLBCL.positive for CD20, CD79a,CD10, bcl-6, and bcl-2. CD3 and CD5 highlight scattered admixed T-cells. CD34 and TdT neg   10/31/2015 Pathology Results   Bone marrow biopsy: Hypercellular bone marrow for age 48-60% with trilineage hematopoiesis, plasma cells 9% with Kappa Light chain excess   11/02/2015 PET scan   Nodular thickening within the mesenteries of small bowel 17 mm concerning for peritoneal implant of high-grade lymphoma, activity along the abdominal wound with inflammation suspicion for infection/abscess/fistula of bladder   11/02/2015 Imaging   Ultrasound scrotum: Normal testes, no epididymitis, no varicocele or hydrocele   11/17/2015 - 03/01/2016 Chemotherapy   R CHOP 6 cycles   04/15/2016 PET scan   No residual abnormal hypermetabolic activity to suggest any residual lymphoma. Prior mesenteric activity has resolved. Metabolic activity left central prostate gland. Retracted left testicle      REVIEW OF SYSTEMS:    Constitutional: Denies fevers, chills or abnormal weight loss All other systems were reviewed with the patient and are negative. Observations/Objective:     Assessment Plan:  Diffuse large B-cell lymphoma of extranodal site [facility]) Small intestine resection, proximal ileum 10/17/2015: Involvement by diffuse large B-cell lymphoma,. Biopsy of peritoneal implant: Invol by a DLBCL.positive for CD20, CD79a,CD10, bcl-6, and bcl-2. CD3 and CD5 highlight scattered admixed T-cells. CD34 and TdT neg   PET/CT 11/02/2015: Nodular thickening within the mesenteries of small bowel 17 mm concerning for peritoneal implant of high-grade lymphoma, activity along the abdominal wound with inflammation suspicion for infection/abscess/fistula of bladder Testicular ultrasound: Normal   Referred to [facility] for evaluation and for a second opinion reg the M protein and lytic lesions: They agreed that he does not have myeloma -----------------------------------------------------------------------------------------------------------------  DLBCL: Prognosis: Revised IPI score: 1 (4 year disease free survival rate: 80%, estimated overall survival 79%) Treatment summary: R-CHOP chemotherapy 6 cycles started 11/17/2015 completed 03/01/2016 CT chest abdomen pelvis 04/30/2018: No evidence of lymphoma recurrence Plan to perform another CT scan in February and follow-up after that.   Bone marrow biopsy 10/29/2015: Hypercellular bone marrow for age 48-60% with trilineage hematopoiesis, plasma cells 9% with Kappa Light chain excess, SPEP 1.5 gm M-Protein Monoclonal paraproteinemia: Bone survey showed bone lytic lesions.Elevated M protein MRI Rt Hip: Negative 10/29/2018: M protein 1.5 g, kappa 19.3 previously was 15.2 no evidence of any significant progression of paraproteinemia. 11/02/2019: SPEP: M protein 1.6 g, hemoglobin 13.7, platelets 188, WBC 4.3, CMP normal limits, LDH 164 10/31/2020: Hemoglobin 13.3, LDH 170, CMP normal, SPEP:  M protein 1.8 g 10/25/2021: Hemoglobin 13.2, Kappa 20.1, lambda 11.2, ratio 1.79, creatinine 1.01, SPEP: M protein 1.7 g 10/22/2022:  Hemoglobin 13.6, platelets 135, LDH 132, IgG 2161, M protein 1.7 g, kappa 22.6, lambda 13.6, ratio 1.66, calcium 8.8, creatinine 1.22   CT CAP 11/02/2019: No evidence of lymphadenopathy in chest abdomen or pelvis, benign focal nodular hyperplasia right lobe of liver   Came back from cruise and came down with Covid Ruptured achilles playing pickle ball: had surgery  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 Return to clinic in 1 year with labs and follow-up.   I discussed the assessment and treatment plan with the patient. The patient was provided an opportunity to ask questions and all were answered. The patient agreed with the plan and demonstrated an understanding of the instructions. The patient was advised to call back or seek an in-person evaluation if the symptoms worsen or if the condition fails to improve as anticipated.   I provided 12 minutes of non-face-to-face time during this encounter.  This includes time for charting and coordination of care   Tamsen Meek, MD  I Janan Ridge am acting as a scribe for Dr.Teneka Malmberg  I have reviewed the above documentation for accuracy and completeness, and I agree with the above.

## 2022-11-07 NOTE — Assessment & Plan Note (Signed)
Small intestine resection, proximal ileum 10/17/2015: Involvement by diffuse large B-cell lymphoma,. Biopsy of peritoneal implant: Invol by a DLBCL.positive for CD20, CD79a,CD10, bcl-6, and bcl-2. CD3 and CD5 highlight scattered admixed T-cells. CD34 and TdT neg   PET/CT 11/02/2015: Nodular thickening within the mesenteries of small bowel 17 mm concerning for peritoneal implant of high-grade lymphoma, activity along the abdominal wound with inflammation suspicion for infection/abscess/fistula of bladder Testicular ultrasound: Normal   Referred to [facility] for evaluation and for a second opinion reg the M protein and lytic lesions: They agreed that he does not have myeloma   -----------------------------------------------------------------------------------------------------------------  DLBCL: Prognosis: Revised IPI score: 1 (4 year disease free survival rate: 80%, estimated overall survival 79%) Treatment summary: R-CHOP chemotherapy 6 cycles started 11/17/2015 completed 03/01/2016 CT chest abdomen pelvis 04/30/2018: No evidence of lymphoma recurrence Plan to perform another CT scan in February and follow-up after that.   Bone marrow biopsy 10/29/2015: Hypercellular bone marrow for age 36-60% with trilineage hematopoiesis, plasma cells 9% with Kappa Light chain excess, SPEP 1.5 gm M-Protein Monoclonal paraproteinemia: Bone survey showed bone lytic lesions.Elevated M protein MRI Rt Hip: Negative 10/29/2018: M protein 1.5 g, kappa 19.3 previously was 15.2 no evidence of any significant progression of paraproteinemia. 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 Return to clinic in 1 year with labs and follow-up.

## 2023-09-08 NOTE — Progress Notes (Signed)
 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 NPSG 12/16/17-  AHI 20.4/hour, desaturation to 84%, body weight 197 lbs Medical problem list includes iron deficiency anemia, large B-cell lymphoma/chemotherapy induced peripheral  Neuropathy, GERD, kidney stones, MGUS, recurrent sinus infections, s/p small bowel resection -----referral from oncology for OSA.  Sleep study done 12/23/17 at [facility].   Albuterol  HFA, Epworth score 7 He describes loud snoring, witnessed apneas, some daytime tiredness.  It may take him 45 minutes to fall asleep and he tends to wake unrefreshed.  Wife confirms. History of ENT surgery-septoplasty and he has been told he has chronic sinus disease.  Degenerative disc disease with C-spine surgery.  Keeps a dry cough at baseline without diagnosed lung disease but has been very slow getting over a chest cold now with persistent green/yellow sputum.  Family history that an uncle snored loudly.  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 They had checked his card several weeks ago.  CPAP 5-20/ [facility] Initial pressure seems low.  Later there is mask leak esp when he is lying on side.  Fairly comfortable. Wife confirms he seems to sleep comfortably without snoring.   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 LOV 5 years ago. CPAP 5-20/ [facility] Download compliance- Epworth score- 4 Body weight today-202 lbs Discussed the use of AI scribe software for clinical note transcription with the patient, who gave verbal consent to proceed.  History of Present Illness   Edward Steele is a 68 year old male with obstructive sleep apnea who presents for CPAP management and adjustment.Wife is here.  He resumed CPAP use after a period of discontinuation due to a ruptured Achilles tendon. He experienced snoring and apnea episodes while he couldn't wear it.SABRA His current CPAP machine, from a defunct provider, is set to a pressure range of 5 to 15 cm H2O. Issues with mask fit cause air leaks, and there is variability in humidifier water usage. Nasal congestion and sinus issues have occurred since resuming CPAP, with occasional nasal 'plugs'. He uses a humidifier setting of 4 for comfort, but higher settings cause condensation in the mask. He experiences more vivid dreams, indicating REM sleep. He is planning [place] trips [place]. We discussed travel CPAP. I explained insurance usually will only provide 1 CPAP in a 5 year interval.    He feels definite benefit from CPAP. Prior to Admission medications   Medication Sig Start Date End Date Taking? Authorizing Provider  acetaminophen  (TYLENOL ) 325 MG tablet Take 650 mg by mouth every 6 (six) hours as needed for mild pain.    [provider]  aspirin EC 81 MG tablet Take 81 mg by mouth daily. Swallow whole.  [provider]  clonazePAM  (KLONOPIN ) 1 MG tablet Take 1 mg by mouth 2 (two) times daily. 09/18/15   [provider]  clonazePAM  (KLONOPIN ) 1 MG tablet Take by mouth. 10/10/21   [provider]  FLUoxetine  (PROZAC ) 20 MG tablet Take 40 mg by mouth 2 (two) times daily. 07/28/15   [provider]  gabapentin  (NEURONTIN ) 300 MG capsule Take 1 capsule (300 mg total) by mouth at bedtime as needed (nerve pain). Patient taking differently: Take 300 mg by mouth 2 (two) times daily. 04/21/17   [name], Vinay, MD  meloxicam  (MOBIC ) 15 MG tablet Take 15 mg by mouth daily. 07/11/19   [provider]  Multiple Vitamins-Minerals (MULTIVITAMIN WITH MINERALS) tablet Take 1 tablet by mouth daily.    [provider]  pantoprazole  (PROTONIX ) 40 MG tablet Take 40 mg by mouth daily. 06/04/19   [provider]  pantoprazole  (PROTONIX ) 40 MG tablet Take 1 tablet by mouth daily. 05/09/21   [provider]  prednisoLONE acetate (PRED FORTE) 1 % ophthalmic suspension 1 drop 2 (two) times daily. 05/23/21   [provider]  Respiratory Therapy Supplies (CARETOUCH 2 CPAP HOSE HANGER) MISC     [provider]  rosuvastatin  (CRESTOR ) 5 MG tablet Take 5 mg by mouth at bedtime.  08/28/15   [provider]  sildenafil (VIAGRA) 100 MG tablet Take by mouth.    [provider]  Vitamin D3 (VITAMIN D ) 25 MCG tablet Take 1,000 Units by mouth daily.    [provider]  zolpidem  (AMBIEN ) 10 MG tablet Take 10 mg by mouth at bedtime as needed for sleep. 02/16/19   [provider]   Past Medical History:  Diagnosis Date   Anemia    oral iron supplement used   Anxiety    Cancer (HCC)    skin cancer - face basal and squamous   Chronic back pain    lower back   Constipation    Depression    GERD (gastroesophageal reflux disease)    Head injury, closed, with concussion    History of kidney stones    x 4 episodes-no recent issues   Keratoconus     left eye   Lymphoma (HCC)    chemo  4 and 1/2 months last chemo Mar 01, 2016   Personal history of chemotherapy 2017   lymphoma    Pneumonia    age 79   Recurrent sinus infections    past history-none recent  Inhalers were for this- no use im many yrs   Sleep apnea    Past Surgical History:  Procedure Laterality Date   ABDOMINAL WALL DEFECT REPAIR N/A 11/08/2015   Procedure: EXPLORE LOWER  ABDOMINAL WALL WITH BIOPSY;  Surgeon: Lynda Leos, MD;  Location: [facility] OR;  Service: General;  Laterality: N/A;   ACHILLES TENDON SURGERY Right 05/22/2022   Procedure: ACHILLES TENDON REPAIR, POSSIBLE TENDON TRANSFER;  Surgeon: Barton Drape, MD;  Location: [facility];  Service: Orthopedics;  Laterality: Right;   ANTERIOR CERVICAL DECOMP/DISCECTOMY FUSION N/A 08/20/2019   Procedure: ACDF - C3-C4;  Surgeon: Joshua Alm RAMAN, MD;  Location: [facility] OR;  Service: Neurosurgery;  Laterality: N/A;  3C   ANTERIOR FUSION CERVICAL SPINE     x 2   BACK SURGERY     lower back with screws and rods   BOWEL RESECTION N/A 10/17/2015   Procedure: EXPLORATORY LAPAROTOMY, SMALL BOWEL RESECTION, WITH ANASTAMOSIS;  Surgeon: Lynda Leos, MD;  Location: [facility];  Service: General;  Laterality: N/A;   CATARACT EXTRACTION, BILATERAL Bilateral    CYSTOSCOPY W/ URETERAL STENT PLACEMENT Left 08/01/2018   Procedure: CYSTOSCOPY WITH RETROGRADE PYELOGRAM/URETERAL STENT PLACEMENT;  Surgeon: Matilda Senior, MD;  Location: [facility];  Service: Urology;  Laterality: Left;   EYE SURGERY Left    corneal tranplant   LAPAROSCOPY N/A 10/17/2015   Procedure: LAPAROSCOPY DIAGNOSTIC;  Surgeon: Lynda Leos, MD;  Location: [facility];  Service: General;  Laterality: N/A;   PORT-A-CATH REMOVAL Left 05/02/2016   Procedure: REMOVAL PORT-A-CATH;  Surgeon: Lynda Leos, MD;  Location: [facility];  Service: General;  Laterality: Left;   PORTACATH PLACEMENT N/A 11/08/2015   Procedure: INSERTION PORT-A-CATH;  Surgeon: Lynda Leos,  MD;  Location: [facility] OR;  Service: General;  Laterality: N/A;   Family History  Problem Relation Age of Onset   Breast cancer Sister    Social History   Socioeconomic History   Marital status: Married    Spouse name: Not on file   Number of children: Not on file   Years of education: Not on file   Highest education level: Not on file  Occupational History   Not on file  Tobacco Use   Smoking status: Never   Smokeless tobacco: Never  Vaping Use   Vaping status: Never Used  Substance and Sexual Activity   Alcohol use: No   Drug use: No   Sexual activity: Yes  Other Topics Concern   Not on file  Social History Narrative   Not on file   Social Drivers of Health   Financial Resource Strain: Not on file  Food Insecurity: No Food Insecurity (06/11/2023)   Received from [facility]   Hunger Vital Sign    Within the past 12 months, you worried that your food would run out before you got the money to buy more.: Never true    Within the past 12 months, the food you bought just didn't last and you didn't have money to get more.: Never true  Transportation Needs: No Transportation Needs (06/11/2023)   Received from [facility]   PRAPARE - Transportation    Lack of Transportation (Medical): No    Lack of Transportation (Non-Medical): No  Physical Activity: Inactive (02/16/2019)   Received from [facility]   Exercise Vital Sign    On average, how many days per week do you engage in moderate to strenuous exercise (like a brisk walk)?: 0 days    On average, how many minutes do you engage in exercise at this level?: 0 min  Stress: Not on file  Social Connections: Not on file  Intimate Partner Violence: Not on file   Assessment and Plan:   Benefits from CPAP and compliant since surgical hiatus. Obstructive Sleep Apnea Obstructive sleep apnea managed with CPAP. Snoring and apnea episodes prompted CPAP resumption after hiatus during recovery from Achilles tendon surgery..  Current machine outdated with 2019 settings. Mask leakage and variable water usage noted. Discussed untreated sleep apnea's potential link to dementia. Insurance limits on travel CPAP machines noted. - Connect with new home care company for CPAP supplies and mask fitting. - Prescribe CPAP with pressure range 5 to 20 cm H2O. - Advise on mask of choice with home care company. - Discuss travel CPAP machine option, noting insurance limitations and potential out-of-pocket costs. - Recommend over-the-counter saline nasal spray for nasal congestion. - Educate on CPAP machine settings, including ramp and humidity adjustments.  ROS-see HPI   + = positive Constitutional:    weight loss, night sweats, fevers, chills, fatigue, lassitude. HEENT:    headaches, difficulty swallowing, tooth/dental problems, sore throat,       sneezing, itching, ear ache, nasal congestion, post nasal drip, snoring CV:    chest pain, orthopnea, PND, swelling in lower extremities, anasarca,                                   dizziness, palpitations Resp:   shortness of breath with exertion or at rest.                productive cough,   non-productive cough, coughing up of blood.              change in color of mucus.  wheezing.   Skin:    rash or lesions. GI:  + heartburn, indigestion, abdominal pain, nausea, vomiting, diarrhea,                 change in bowel habits, loss of appetite GU: dysuria, change in color of urine, no urgency or frequency.   flank pain. MS:   joint pain, stiffness, decreased range of motion, back pain. Neuro-     nothing unusual Psych:  change in mood or affect.  depression or anxiety.   memory loss.   OBJ- Physical Exam General- Alert, Oriented, Affect-appropriate, Distress- none acute Skin- rash-none, lesions- none, excoriation- none Lymphadenopathy- none Head- atraumatic            Eyes- Gross vision intact, PERRLA, conjunctivae and secretions clear            Ears- Hearing,  canals-normal            Nose- Clear, no-Septal dev, mucus, polyps, erosion, perforation             Throat- Mallampati III , mucosa clear , drainage- none, tonsils- atrophic Neck- flexible , trachea midline, no stridor , thyroid nl, carotid no bruit Chest - symmetrical excursion , unlabored           Heart/CV- RRR , no murmur , no gallop  , no rub, nl s1 s2                           - JVD- none , edema- none, stasis changes- none, varices- none           Lung- clear to P&A, wheeze- none, cough- none , dullness-none, rub- none           Chest wall-  Abd-  Br/ Gen/ Rectal- Not done, not indicated Extrem- cyanosis- none, clubbing, none, atrophy- none, strength- nl Neuro- grossly intact to observation      ROS-see HPI   + = positive Constitutional:    weight loss, night sweats, fevers, chills, +fatigue, lassitude. HEENT:    headaches, difficulty swallowing, tooth/dental problems, sore throat,       sneezing, itching, ear ache, nasal congestion, post nasal drip, snoring CV:    chest pain, orthopnea, PND, swelling in lower extremities, anasarca,                                   dizziness, palpitations Resp:   shortness of breath with exertion or at rest.  productive cough,   non-productive cough, coughing up of blood.              change in color of mucus.  wheezing.   Skin:    rash or lesions. GI:  No-   heartburn, indigestion, abdominal pain, nausea, vomiting, diarrhea,                 change in bowel habits, loss of appetite GU: dysuria, change in color of urine, no urgency or frequency.   flank pain. MS:   joint pain, stiffness, decreased range of motion, back pain. Neuro-     nothing unusual Psych:  change in mood or affect.  depression or anxiety.   memory loss.  OBJ- Physical Exam General- Alert, Oriented, Affect-appropriate, Distress- none acute, not obese Skin- rash-none, lesions- none, excoriation- none Lymphadenopathy- none Head- atraumatic             Eyes- Gross vision intact, PERRLA, conjunctivae and secretions clear            Ears- Hearing, canals-normal            Nose- Clear, no-Septal dev, mucus, polyps, erosion, perforation             Throat- Mallampati III-IV , mucosa clear , drainage- none, tonsils- atrophic, + own teeth with repair Neck- flexible , trachea midline, no stridor , thyroid nl, carotid no bruit Chest - symmetrical excursion , unlabored           Heart/CV- RRR , no murmur , no gallop  , no rub, nl s1 s2                           - JVD- none , edema- none, stasis changes- none, varices- none           Lung- clear to P&A, wheeze- none, cough- none , dullness-none, rub- none           Chest wall-  Abd-  Br/ Gen/ Rectal- Not done, not indicated Extrem- cyanosis- none, clubbing, none, atrophy- none, strength- nl Neuro- grossly intact to observation

## 2023-09-09 ENCOUNTER — Encounter: Payer: Self-pay | Admitting: Internal Medicine

## 2023-09-09 ENCOUNTER — Ambulatory Visit (INDEPENDENT_AMBULATORY_CARE_PROVIDER_SITE_OTHER): Admitting: Internal Medicine

## 2023-09-09 VITALS — BP 118/62 | HR 70 | Temp 97.6°F | Ht 70.0 in | Wt 202.4 lb

## 2023-09-09 DIAGNOSIS — G4733 Obstructive sleep apnea (adult) (pediatric): Secondary | ICD-10-CM | POA: Diagnosis not present

## 2023-09-09 NOTE — Patient Instructions (Signed)
 Order- new DME to replace [facility] ( no longer in business) to continue CPAP.  Replace old CPAP, auto 5-20, mask of choice, heated humidification, supplies, AirView/ card

## 2023-09-22 ENCOUNTER — Telehealth: Payer: Self-pay | Admitting: Internal Medicine

## 2023-09-22 NOTE — Telephone Encounter (Signed)
 Per Arvella at [facility]-  Per our intake team we need the following to process this request :  1. Office visit face 2 face note that shows patient is using and benefitting from the pap unit.

## 2023-09-23 NOTE — Telephone Encounter (Signed)
 Arvella can see the notes but in the OV notes it needs to say that patient is using and benefiting from using PAP

## 2023-09-23 NOTE — Telephone Encounter (Signed)
 Hello Edward Steele, Patient was seen by Dr. Neysa on 09/09/2023.  How do I get this office visit note to Brad at [facility]?  Thank you!

## 2023-09-25 ENCOUNTER — Telehealth: Payer: Self-pay

## 2023-09-25 NOTE — Telephone Encounter (Signed)
 Copied from CRM [id number]. Topic: Clinical - Order For Equipment >> Sep 25, 2023 10:43 AM Leila C wrote: Reason for CRM: Patient's spouse Devere [phone] states is waiting for cpap machine DME- [employer] company to contact them, last time she spoke with them they're system is down. Unsure if patient needs to call them or our office? Provided [facility] phone #[phone]. Please advise and call back. >> Sep 25, 2023 10:53 AM Rozanna MATSU wrote: MRS Blakney CALLED BACK STATED [facility] HAS SENT ANOTHER REQ THIS MORNING REQUESTING THE OV NOTES OR FACE TO FACE NOTES FOR PT

## 2023-09-29 ENCOUNTER — Encounter: Payer: Self-pay | Admitting: Internal Medicine

## 2023-09-29 NOTE — Telephone Encounter (Signed)
 Hello Edward Steele, Patient was seen by Dr. Neysa on 09/09/2023.  How do I get this office visit note to Brad at [employer]?  Thank you!       09/22/23  4:44 PM Edward Steele, Edward Steele routed this conversation to Edward Reggy BIRCH, MD  Edward Steele, CMA Edward Steele Ear MB   09/22/23  4:44 PM Note Per Edward Steele at [facility]-  Per our intake team we need the following to process this request :  1. Office visit face 2 face note that shows patient is using and benefitting from the pap unit.

## 2023-09-29 NOTE — Telephone Encounter (Signed)
 Spoke with Dr. Neysa.  He is working on the [employer] notes and should have them completed.  Will inform once done and we can send to [employer].

## 2023-09-29 NOTE — Telephone Encounter (Signed)
 Merged the two encounters from 09/22/23 and 09/25/23.  Please see encounter 09/25/2023. This is regarding the same message.

## 2023-09-30 NOTE — Telephone Encounter (Signed)
 Sent to [facility]

## 2023-10-06 NOTE — Telephone Encounter (Signed)
 OV note updated from 09/09/2023.  Called Brad at [employer], notified of update.  [facility]N.

## 2023-11-07 ENCOUNTER — Inpatient Hospital Stay: Payer: Medicare Other | Attending: Hematology and Oncology

## 2023-11-07 ENCOUNTER — Telehealth: Payer: Self-pay | Admitting: *Deleted

## 2023-11-07 DIAGNOSIS — C83398 Diffuse large b-cell lymphoma of other extranodal and solid organ sites: Secondary | ICD-10-CM | POA: Diagnosis present

## 2023-11-07 LAB — CBC WITH DIFFERENTIAL (CANCER CENTER ONLY)
Abs Immature Granulocytes: 0.01 K/uL (ref 0.00–0.07)
Basophils Absolute: 0 K/uL (ref 0.0–0.1)
Basophils Relative: 0 %
Eosinophils Absolute: 0.1 K/uL (ref 0.0–0.5)
Eosinophils Relative: 2 %
HCT: 39.9 % (ref 39.0–52.0)
Hemoglobin: 13.5 g/dL (ref 13.0–17.0)
Immature Granulocytes: 0 %
Lymphocytes Relative: 38 %
Lymphs Abs: 1.8 K/uL (ref 0.7–4.0)
MCH: 29.9 pg (ref 26.0–34.0)
MCHC: 33.8 g/dL (ref 30.0–36.0)
MCV: 88.5 fL (ref 80.0–100.0)
Monocytes Absolute: 0.4 K/uL (ref 0.1–1.0)
Monocytes Relative: 8 %
Neutro Abs: 2.4 K/uL (ref 1.7–7.7)
Neutrophils Relative %: 52 %
Platelet Count: 164 K/uL (ref 150–400)
RBC: 4.51 MIL/uL (ref 4.22–5.81)
RDW: 13.2 % (ref 11.5–15.5)
WBC Count: 4.7 K/uL (ref 4.0–10.5)
nRBC: 0 % (ref 0.0–0.2)

## 2023-11-07 LAB — CMP (CANCER CENTER ONLY)
ALT: 25 U/L (ref 0–44)
AST: 29 U/L (ref 15–41)
Albumin: 4.1 g/dL (ref 3.5–5.0)
Alkaline Phosphatase: 72 U/L (ref 38–126)
Anion gap: 6 (ref 5–15)
BUN: 19 mg/dL (ref 8–23)
CO2: 29 mmol/L (ref 22–32)
Calcium: 9.1 mg/dL (ref 8.9–10.3)
Chloride: 103 mmol/L (ref 98–111)
Creatinine: 1.12 mg/dL (ref 0.61–1.24)
GFR, Estimated: >60 mL/min (ref >60)
Glucose, Bld: 82 mg/dL (ref 70–99)
Potassium: 3.8 mmol/L (ref 3.5–5.1)
Sodium: 138 mmol/L (ref 135–145)
Total Bilirubin: 0.5 mg/dL (ref 0.0–1.2)
Total Protein: 8.5 g/dL — ABNORMAL HIGH (ref 6.5–8.1)

## 2023-11-07 LAB — LACTATE DEHYDROGENASE: LDH: 138 U/L (ref 98–192)

## 2023-11-07 NOTE — Telephone Encounter (Signed)
 Received call from pt asking if labs need to be fasting today.  Informed no!  He also wants to change his f/u appt with Dr Odean to an office visit instead of telephone visit.  Message sent to scheduler.

## 2023-11-11 LAB — KAPPA/LAMBDA LIGHT CHAINS
Kappa free light chain: 21.8 mg/L — ABNORMAL HIGH (ref 3.3–19.4)
Kappa, lambda light chain ratio: 1.64 (ref 0.26–1.65)
Lambda free light chains: 13.3 mg/L (ref 5.7–26.3)

## 2023-11-12 LAB — MULTIPLE MYELOMA PANEL, SERUM
Albumin SerPl Elph-Mcnc: 3.8 g/dL (ref 2.9–4.4)
Albumin/Glob SerPl: 0.9 (ref 0.7–1.7)
Alpha 1: 0.2 g/dL (ref 0.0–0.4)
Alpha2 Glob SerPl Elph-Mcnc: 0.7 g/dL (ref 0.4–1.0)
B-Globulin SerPl Elph-Mcnc: 1.1 g/dL (ref 0.7–1.3)
Gamma Glob SerPl Elph-Mcnc: 2.4 g/dL — ABNORMAL HIGH (ref 0.4–1.8)
Globulin, Total: 4.4 g/dL — ABNORMAL HIGH (ref 2.2–3.9)
IgA: 109 mg/dL (ref 61–437)
IgG (Immunoglobin G), Serum: 2454 mg/dL — ABNORMAL HIGH (ref 603–1613)
IgM (Immunoglobulin M), Srm: 82 mg/dL (ref 20–172)
M Protein SerPl Elph-Mcnc: 1.8 g/dL — ABNORMAL HIGH
Total Protein ELP: 8.2 g/dL (ref 6.0–8.5)

## 2023-11-17 ENCOUNTER — Inpatient Hospital Stay: Payer: Medicare Other | Attending: Hematology and Oncology | Admitting: Hematology and Oncology

## 2023-11-17 VITALS — BP 120/80 | HR 63 | Temp 97.9°F | Resp 18 | Ht 70.0 in | Wt 198.7 lb

## 2023-11-17 DIAGNOSIS — C83398 Diffuse large b-cell lymphoma of other extranodal and solid organ sites: Secondary | ICD-10-CM | POA: Diagnosis not present

## 2023-11-17 DIAGNOSIS — G253 Myoclonus: Secondary | ICD-10-CM | POA: Insufficient documentation

## 2023-11-17 DIAGNOSIS — C833A Diffuse large b-cell lymphoma, in remission: Secondary | ICD-10-CM | POA: Insufficient documentation

## 2023-11-17 DIAGNOSIS — D472 Monoclonal gammopathy: Secondary | ICD-10-CM

## 2023-11-17 MED ORDER — GABAPENTIN 300 MG PO CAPS: 900.0000 mg | ORAL_CAPSULE | Freq: Every day | ORAL | 11 refills | Status: AC

## 2023-11-17 NOTE — Progress Notes (Signed)
 Patient Care Team: Nanci Senior, MD as PCP - General (Family Medicine) Verdel Reymundo RAMAN, MD as Referring Physician (Hematology and Oncology)  DIAGNOSIS:  Encounter Diagnoses  Name Primary?   Diffuse large B-cell lymphoma of extranodal site Yes   MGUS (monoclonal gammopathy of unknown significance)     SUMMARY OF ONCOLOGIC HISTORY: Oncology History  Diffuse large B-cell lymphoma of extranodal site  10/17/2015 Initial Diagnosis   Small intestine resection, proximal ileum: Involvement by diffuse large B-cell lymphoma,. Biopsy of peritoneal implant: Invol by a DLBCL.positive for CD20, CD79a,CD10, bcl-6, and bcl-2. CD3 and CD5 highlight scattered admixed T-cells. CD34 and TdT neg   10/31/2015 Pathology Results   Bone marrow biopsy: Hypercellular bone marrow for age 54-60% with trilineage hematopoiesis, plasma cells 9% with Kappa Light chain excess   11/02/2015 PET scan   Nodular thickening within the mesenteries of small bowel 17 mm concerning for peritoneal implant of high-grade lymphoma, activity along the abdominal wound with inflammation suspicion for infection/abscess/fistula of bladder   11/02/2015 Imaging   Ultrasound scrotum: Normal testes, no epididymitis, no varicocele or hydrocele   11/17/2015 - 03/01/2016 Chemotherapy   R CHOP 6 cycles   04/15/2016 PET scan   No residual abnormal hypermetabolic activity to suggest any residual lymphoma. Prior mesenteric activity has resolved. Metabolic activity left central prostate gland. Retracted left testicle      CHIEF COMPLIANT: Surveillance of breast cancer and MGUS  HISTORY OF PRESENT ILLNESS:  History of Present Illness Edward Steele is a 68 year old male with myeloma who presents for routine follow-up and lab review.  He experiences 'jumping' sensations in his feet at night, which he attributes to nerve-related issues. He manages these symptoms with an increased gabapentin  dosage of 900 mg at night, which he finds helpful. He  also takes Klonopin  daily, which may contribute to these symptoms.  He has a past medical history of lymphoma, currently in remission.  He remains active, engaging in activities such as kayaking for four hours at a time, despite experiencing some back soreness. He takes meloxicam  as needed for back pain.     ALLERGIES:  is allergic to penicillin g.  MEDICATIONS:  Current Outpatient Medications  Medication Sig Dispense Refill   clonazePAM  (KLONOPIN ) 1 MG tablet Take by mouth.     FLUoxetine  (PROZAC ) 20 MG tablet Take 40 mg by mouth 2 (two) times daily.     meloxicam  (MOBIC ) 15 MG tablet Take 15 mg by mouth daily.     pantoprazole  (PROTONIX ) 40 MG tablet Take 40 mg by mouth daily.     Respiratory Therapy Supplies (CARETOUCH 2 CPAP HOSE HANGER) MISC      rosuvastatin  (CRESTOR ) 5 MG tablet Take 5 mg by mouth at bedtime.      acetaminophen  (TYLENOL ) 325 MG tablet Take 650 mg by mouth every 6 (six) hours as needed for mild pain.     clonazePAM  (KLONOPIN ) 1 MG tablet Take 1 mg by mouth 2 (two) times daily. (Patient not taking: Reported on 11/17/2023)     gabapentin  (NEURONTIN ) 300 MG capsule Take 3 capsules (900 mg total) by mouth at bedtime. 90 capsule 11   Multiple Vitamins-Minerals (MULTIVITAMIN WITH MINERALS) tablet Take 1 tablet by mouth daily. (Patient not taking: Reported on 11/17/2023)     pantoprazole  (PROTONIX ) 40 MG tablet Take 1 tablet by mouth daily.     Vitamin D3 (VITAMIN D ) 25 MCG tablet Take 1,000 Units by mouth daily. (Patient not taking: Reported on 11/17/2023)  No current facility-administered medications for this visit.    PHYSICAL EXAMINATION: ECOG PERFORMANCE STATUS: 1 - Symptomatic but completely ambulatory  Vitals:   11/17/23 1000  BP: 120/80  Pulse: 63  Resp: 18  Temp: 97.9 F (36.6 C)  SpO2: 100%   Filed Weights   11/17/23 1000  Weight: 198 lb 11.2 oz (90.1 kg)    Physical Exam ABDOMEN: Liver and spleen normal.  (exam performed in the presence of a  chaperone)  LABORATORY DATA:  I have reviewed the data as listed    Latest Ref Rng & Units 11/07/2023    2:46 PM 10/22/2022   11:12 AM 10/25/2021   11:17 AM  CMP  Glucose 70 - 99 mg/dL 82  94  895   BUN 8 - 23 mg/dL 19  25  26    Creatinine 0.61 - 1.24 mg/dL 8.87  8.77  8.98   Sodium 135 - 145 mmol/L 138  138  137   Potassium 3.5 - 5.1 mmol/L 3.8  4.7  4.0   Chloride 98 - 111 mmol/L 103  105  106   CO2 22 - 32 mmol/L 29  29  28    Calcium  8.9 - 10.3 mg/dL 9.1  8.8  9.2   Total Protein 6.5 - 8.1 g/dL 8.5  7.9  8.1   Total Bilirubin 0.0 - 1.2 mg/dL 0.5  0.6  0.8   Alkaline Phos 38 - 126 U/L 72  61  57   AST 15 - 41 U/L 29  25  24    ALT 0 - 44 U/L 25  19  18      Lab Results  Component Value Date   WBC 4.7 11/07/2023   HGB 13.5 11/07/2023   HCT 39.9 11/07/2023   MCV 88.5 11/07/2023   PLT 164 11/07/2023   NEUTROABS 2.4 11/07/2023    ASSESSMENT & PLAN:  Diffuse large B-cell lymphoma of extranodal site [facility]) Small intestine resection, proximal ileum 10/17/2015: Involvement by diffuse large B-cell lymphoma,. Biopsy of peritoneal implant: Invol by a DLBCL.positive for CD20, CD79a,CD10, bcl-6, and bcl-2. CD3 and CD5 highlight scattered admixed T-cells. CD34 and TdT neg   PET/CT 11/02/2015: Nodular thickening within the mesenteries of small bowel 17 mm concerning for peritoneal implant of high-grade lymphoma, activity along the abdominal wound with inflammation suspicion for infection/abscess/fistula of bladder Testicular ultrasound: Normal   Referred to [facility] for evaluation and for a second opinion reg the M protein and lytic lesions: They agreed that he does not have myeloma -----------------------------------------------------------------------------------------------------------------  DLBCL: Prognosis: Revised IPI score: 1 (4 year disease free survival rate: 80%, estimated overall survival 79%) Treatment summary: R-CHOP chemotherapy 6 cycles started 11/17/2015 completed  03/01/2016 CT chest abdomen pelvis 04/30/2018: No evidence of lymphoma recurrence Plan to perform another CT scan in February and follow-up after that.   Bone marrow biopsy 10/29/2015: Hypercellular bone marrow for age 50-60% with trilineage hematopoiesis, plasma cells 9% with Kappa Light chain excess, SPEP 1.5 gm M-Protein Monoclonal paraproteinemia: Bone survey showed bone lytic lesions.Elevated M protein MRI Rt Hip: Negative 10/29/2018: M protein 1.5 g, kappa 19.3 previously was 15.2 no evidence of any significant progression of paraproteinemia. 11/02/2019: SPEP: M protein 1.6 g, hemoglobin 13.7, platelets 188, WBC 4.3, CMP normal limits, LDH 164 10/31/2020: Hemoglobin 13.3, LDH 170, CMP normal, SPEP: M protein 1.8 g 10/25/2021: Hemoglobin 13.2, Kappa 20.1, lambda 11.2, ratio 1.79, creatinine 1.01, SPEP: M protein 1.7 g 10/22/2022: Hemoglobin 13.6, IgG 2161, M protein 1.7 g, kappa 22.6, lambda 13.6,  ratio 1.66, calcium  8.8, cr 1.22 11/07/2023: Hemoglobin 13.5, IgG 2454, M protein 1.8 g, kappa 21.8, lambda 13.3, ratio 1.64, calcium  9.1, cr 1.12   CT CAP 11/02/2019: No evidence of lymphadenopathy in chest abdomen or pelvis, benign focal nodular hyperplasia right lobe of liver  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 Return to clinic in 1 year with labs and follow-up. ------------------------------------- Assessment and Plan Assessment & Plan Monoclonal gammopathy (monoclonal paraproteinemia) Monoclonal gammopathy with stable kappa light chains and M protein levels. Slight increase in M protein over five years not significant for intervention. No anemia, renal impairment, or hypercalcemia. Low risk of progression to life-threatening stage. - Continue annual monitoring of myeloma panel, including light chains and M protein. - Monitor kidney  function, calcium  levels, and complete blood count annually.  Diffuse large B-cell lymphoma of extranodal site, in remission Lymphoma remains in remission with no signs of recurrence. Not life-threatening.  Myoclonic jerks of lower extremities Intermittent myoclonic jerks possibly related to Klonopin . Gabapentin  effective at 900 mg at night. Consider magnesium for nerve excitability. Symptoms affect sleep quality. - Continue gabapentin  900 mg at night as needed. - Consider magnesium supplementation, 400 mg at night. - Evaluate need for Klonopin  and discuss potential side effects.      Orders Placed This Encounter  Procedures   CBC with Differential (Cancer Center Only)    Standing Status:   Future    Expiration Date:   11/16/2024   CMP (Cancer Center only)    Standing Status:   Future    Expiration Date:   11/16/2024   Beta 2 microglobulin, serum    Standing Status:   Future    Expiration Date:   11/16/2024   Multiple Myeloma Panel (SPEP&IFE w/QIG)    Standing Status:   Future    Expiration Date:   11/16/2024   Kappa/lambda light chains    Standing Status:   Future    Expiration Date:   11/16/2024   Lactate dehydrogenase    Standing Status:   Future    Expiration Date:   11/16/2024   The patient has a good understanding of the overall plan. he agrees with it. he will call with any problems that may develop before the next visit here. Total time spent: 30 mins including face to face time and time spent for planning, charting and co-ordination of care   Naomi MARLA Chad, MD 11/17/23

## 2023-11-17 NOTE — Assessment & Plan Note (Signed)
 Small intestine resection, proximal ileum 10/17/2015: Involvement by diffuse large B-cell lymphoma,. Biopsy of peritoneal implant: Invol by a DLBCL.positive for CD20, CD79a,CD10, bcl-6, and bcl-2. CD3 and CD5 highlight scattered admixed T-cells. CD34 and TdT neg   PET/CT 11/02/2015: Nodular thickening within the mesenteries of small bowel 17 mm concerning for peritoneal implant of high-grade lymphoma, activity along the abdominal wound with inflammation suspicion for infection/abscess/fistula of bladder Testicular ultrasound: Normal   Referred to [facility] for evaluation and for a second opinion reg the M protein and lytic lesions: They agreed that he does not have myeloma -----------------------------------------------------------------------------------------------------------------  DLBCL: Prognosis: Revised IPI score: 1 (4 year disease free survival rate: 80%, estimated overall survival 79%) Treatment summary: R-CHOP chemotherapy 6 cycles started 11/17/2015 completed 03/01/2016 CT chest abdomen pelvis 04/30/2018: No evidence of lymphoma recurrence Plan to perform another CT scan in February and follow-up after that.   Bone marrow biopsy 10/29/2015: Hypercellular bone marrow for age 27-60% with trilineage hematopoiesis, plasma cells 9% with Kappa Light chain excess, SPEP 1.5 gm M-Protein Monoclonal paraproteinemia: Bone survey showed bone lytic lesions.Elevated M protein MRI Rt Hip: Negative 10/29/2018: M protein 1.5 g, kappa 19.3 previously was 15.2 no evidence of any significant progression of paraproteinemia. 11/02/2019: SPEP: M protein 1.6 g, hemoglobin 13.7, platelets 188, WBC 4.3, CMP normal limits, LDH 164 10/31/2020: Hemoglobin 13.3, LDH 170, CMP normal, SPEP: M protein 1.8 g 10/25/2021: Hemoglobin 13.2, Kappa 20.1, lambda 11.2, ratio 1.79, creatinine 1.01, SPEP: M protein 1.7 g 10/22/2022: Hemoglobin 13.6, IgG 2161, M protein 1.7 g, kappa 22.6, lambda 13.6, ratio 1.66, calcium  8.8, cr  1.22 11/07/2023: Hemoglobin 13.5, IgG 2454, M protein 1.8 g, kappa 21.8, lambda 13.3, ratio 1.64, calcium  9.1, cr 1.12   CT CAP 11/02/2019: No evidence of lymphadenopathy in chest abdomen or pelvis, benign focal nodular hyperplasia right lobe of liver   Came back from cruise and came down with Covid Ruptured achilles playing pickle ball: had surgery   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 Return to clinic in 1 year with labs and follow-up.

## 2023-12-11 ENCOUNTER — Ambulatory Visit: Admitting: Internal Medicine

## 2023-12-15 NOTE — Progress Notes (Signed)
 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 LOV 5 years ago. CPAP 5-20/ [facility] Download compliance- Epworth score- 4 Body weight today-202 lbs Discussed the use of AI scribe software for clinical note transcription with the patient, who gave verbal consent to proceed.  History of Present Illness   Jeffre Enriques is a 68 year old male with obstructive sleep apnea who presents for CPAP management and adjustment.Wife is here.  He resumed CPAP use after a period of discontinuation due to a ruptured Achilles tendon. He experienced snoring and apnea episodes while he couldn't wear it.SABRA His current CPAP machine, from a defunct provider, is set to a pressure range of 5 to 15 cm H2O. Issues with mask fit cause air leaks, and there is variability in humidifier water usage. Nasal congestion and sinus issues have occurred since resuming CPAP, with occasional nasal 'plugs'. He uses a humidifier setting of 4 for comfort, but higher settings cause condensation in the mask. He experiences more vivid dreams, indicating REM sleep. He is planning mission trips abroad. We discussed travel CPAP. I explained insurance usually will only provide 1 CPAP in a 5 year interval.    He feels definite benefit from CPAP.   Assessment and Plan:    Benefits from CPAP and compliant since surgical hiatus. Obstructive Sleep Apnea Obstructive sleep apnea managed with CPAP. Snoring and apnea episodes prompted CPAP resumption after hiatus during recovery from Achilles tendon surgery.. Current machine outdated with 2019 settings. Mask leakage and variable water usage noted. Discussed untreated sleep apnea's potential link to dementia. Insurance limits on travel CPAP machines noted. - Connect with new home care company for CPAP supplies and mask fitting. - Prescribe CPAP with pressure range 5 to 20 cm H2O. - Advise on mask of choice with home care company. - Discuss travel CPAP machine option, noting insurance limitations and potential out-of-pocket costs. - Recommend over-the-counter saline nasal spray for nasal congestion. - Educate on CPAP machine settings, including ramp and humidity adjustments.       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 CPAP 5-20/ [facility]   replaced 09/2023 Download compliance-100%, AHI 3.2/hr Body weight today-- 200 lbs Discussed the use of AI scribe software for clinical note transcription with the patient, who gave verbal consent to proceed.  History of Present Illness   Willmer Fellers is a 68 year old male with sleep apnea who presents for evaluation of CPAP mask fit and pressure settings.  He uses a CPAP machine with a hybrid mask covering his mouth and nose. He experiences leakage issues when the machine reaches higher pressures, particularly around 12 to 13 cm H2O. Attempts to adjust the mask and change sleeping positions to reduce leakage are not consistently effective. Condensation within the mask is also noted, especially at higher pressures.  He replaces the mask every six to seven weeks,  contrary to the recommended  two-week replacement schedule. He maintains and cleans his equipment regularly and travels with it, including overseas.   We will reduce pressure to reduce leak     Assessment and Plan:    Obstructive sleep apnea Current CPAP therapy set to 5-20 cm H2O. Mask leakage at higher pressures affects efficacy. Goal: <5 apneas/hour. Discussed mask fit variability due to facial changes and limited home care options. Considered CPAP pressure adjustment to reduce leakage, accepting slight apnea increase within limits. - Adjust CPAP pressure range to max 15 cm H2O to reduce leakage. - Encourage contacting home care company for different mask styles. - Provide prescription for online supply orders if requested. - Send order to [employer] for mask replacement and supplies. - Advise contacting home care company for new supply orders. - Discuss online purchase option through [email or web address]/, provide prescription if needed.      ROS-see HPI   + = positive Constitutional:    weight loss, night sweats, fevers, chills, fatigue, lassitude. HEENT:    headaches, difficulty swallowing, tooth/dental problems, sore throat,       sneezing, itching, ear ache, nasal congestion, post nasal drip, snoring CV:    chest pain, orthopnea, PND, swelling in lower extremities, anasarca,                                   dizziness, palpitations Resp:   shortness of breath with exertion or at rest.                productive cough,   non-productive cough, coughing up of blood.              change in color of mucus.  wheezing.   Skin:    rash or lesions. GI:  + heartburn, indigestion, abdominal pain, nausea, vomiting, diarrhea,                 change in bowel habits, loss of appetite GU: dysuria, change in color of urine, no urgency or frequency.   flank pain. MS:   joint pain, stiffness, decreased range of motion, back pain. Neuro-     nothing unusual Psych:  change in mood or affect.  depression or anxiety.    memory loss.   OBJ- Physical Exam General- Alert, Oriented, Affect-appropriate, Distress- none acute Skin- rash-none, lesions- none, excoriation- none Lymphadenopathy- none Head- atraumatic            Eyes- Gross vision intact, PERRLA, conjunctivae and secretions clear            Ears- Hearing, canals-normal            Nose- Clear, no-Septal dev, mucus, polyps, erosion, perforation             Throat- Mallampati III , mucosa clear , drainage- none, tonsils- atrophic Neck- flexible , trachea midline, no stridor , thyroid nl, carotid no bruit Chest - symmetrical excursion , unlabored           Heart/CV- RRR , no murmur , no gallop  , no rub, nl s1 s2                           - JVD- none , edema- none, stasis changes- none, varices- none           Lung- clear to P&A, wheeze- none, cough- none , dullness-none, rub- none  Chest wall-  Abd-  Br/ Gen/ Rectal- Not done, not indicated Extrem- cyanosis- none, clubbing, none, atrophy- none, strength- nl Neuro- grossly intact to observation

## 2023-12-16 ENCOUNTER — Encounter: Payer: Self-pay | Admitting: Internal Medicine

## 2023-12-16 ENCOUNTER — Ambulatory Visit: Admitting: Internal Medicine

## 2023-12-16 VITALS — BP 122/82 | HR 64 | Temp 97.7°F | Ht 70.0 in | Wt 200.8 lb

## 2023-12-16 DIAGNOSIS — G4733 Obstructive sleep apnea (adult) (pediatric): Secondary | ICD-10-CM

## 2023-12-16 NOTE — Patient Instructions (Signed)
 Order- DME [facility] - please change autopap range to 5-15. Continue mask of choice, humidifier, supplies, AirView/ card  Please let us  know if we can help  I mentioned one possible on-line source for CPAP supplies- [email or web address] There are others. If you decide to try ordering from an on-line source, let us  know and we can print you a prescription so insurance can help pay.

## 2023-12-20 ENCOUNTER — Encounter: Payer: Self-pay | Admitting: Internal Medicine

## 2024-11-16 ENCOUNTER — Other Ambulatory Visit

## 2024-11-30 ENCOUNTER — Ambulatory Visit: Admitting: Hematology and Oncology
